# Patient Record
Sex: Female | Born: 1950 | Race: White | Hispanic: No | Smoking: Never smoker
Health system: Southern US, Community
[De-identification: ages and names within clinical notes are randomized; demographics above are authoritative.]

## PROBLEM LIST (undated history)

## (undated) DIAGNOSIS — Z98811 Dental restoration status: Secondary | ICD-10-CM

## (undated) DIAGNOSIS — F32A Depression, unspecified: Secondary | ICD-10-CM

## (undated) DIAGNOSIS — F329 Major depressive disorder, single episode, unspecified: Secondary | ICD-10-CM

## (undated) DIAGNOSIS — IMO0002 Reserved for concepts with insufficient information to code with codable children: Secondary | ICD-10-CM

## (undated) DIAGNOSIS — I10 Essential (primary) hypertension: Secondary | ICD-10-CM

## (undated) DIAGNOSIS — Z853 Personal history of malignant neoplasm of breast: Secondary | ICD-10-CM

## (undated) DIAGNOSIS — H269 Unspecified cataract: Secondary | ICD-10-CM

## (undated) DIAGNOSIS — F419 Anxiety disorder, unspecified: Secondary | ICD-10-CM

## (undated) HISTORY — DX: Essential (primary) hypertension: I10

## (undated) HISTORY — PX: APPENDECTOMY: SHX54

## (undated) HISTORY — DX: Major depressive disorder, single episode, unspecified: F32.9

## (undated) HISTORY — PX: ABSCESS DRAINAGE: SHX1119

## (undated) HISTORY — DX: Depression, unspecified: F32.A

---

## 1987-06-07 HISTORY — PX: ABDOMINAL HYSTERECTOMY: SHX81

## 1997-12-31 ENCOUNTER — Encounter: Admission: RE | Admit: 1997-12-31 | Discharge: 1997-12-31 | Payer: Self-pay | Admitting: *Deleted

## 2001-08-31 ENCOUNTER — Ambulatory Visit (HOSPITAL_COMMUNITY): Admission: RE | Admit: 2001-08-31 | Discharge: 2001-08-31 | Payer: Self-pay

## 2001-08-31 ENCOUNTER — Encounter: Payer: Self-pay | Admitting: Internal Medicine

## 2002-06-06 HISTORY — PX: COLONOSCOPY: SHX174

## 2002-09-20 ENCOUNTER — Encounter: Payer: Self-pay | Admitting: Internal Medicine

## 2002-09-20 ENCOUNTER — Ambulatory Visit (HOSPITAL_COMMUNITY): Admission: RE | Admit: 2002-09-20 | Discharge: 2002-09-20 | Payer: Self-pay | Admitting: Internal Medicine

## 2002-12-20 ENCOUNTER — Ambulatory Visit (HOSPITAL_COMMUNITY): Admission: RE | Admit: 2002-12-20 | Discharge: 2002-12-20 | Payer: Self-pay | Admitting: Internal Medicine

## 2003-09-26 ENCOUNTER — Ambulatory Visit (HOSPITAL_COMMUNITY): Admission: RE | Admit: 2003-09-26 | Discharge: 2003-09-26 | Payer: Self-pay | Admitting: Internal Medicine

## 2004-10-01 ENCOUNTER — Ambulatory Visit (HOSPITAL_COMMUNITY): Admission: RE | Admit: 2004-10-01 | Discharge: 2004-10-01 | Payer: Self-pay | Admitting: Internal Medicine

## 2005-10-03 ENCOUNTER — Ambulatory Visit (HOSPITAL_COMMUNITY): Admission: RE | Admit: 2005-10-03 | Discharge: 2005-10-03 | Payer: Self-pay | Admitting: Internal Medicine

## 2006-10-09 ENCOUNTER — Ambulatory Visit (HOSPITAL_COMMUNITY): Admission: RE | Admit: 2006-10-09 | Discharge: 2006-10-09 | Payer: Self-pay | Admitting: Internal Medicine

## 2007-10-12 ENCOUNTER — Ambulatory Visit (HOSPITAL_COMMUNITY): Admission: RE | Admit: 2007-10-12 | Discharge: 2007-10-12 | Payer: Self-pay | Admitting: Internal Medicine

## 2008-10-15 ENCOUNTER — Ambulatory Visit (HOSPITAL_COMMUNITY): Admission: RE | Admit: 2008-10-15 | Discharge: 2008-10-15 | Payer: Self-pay | Admitting: Internal Medicine

## 2009-10-19 ENCOUNTER — Ambulatory Visit (HOSPITAL_COMMUNITY): Admission: RE | Admit: 2009-10-19 | Discharge: 2009-10-19 | Payer: Self-pay | Admitting: Internal Medicine

## 2010-06-27 ENCOUNTER — Encounter: Payer: Self-pay | Admitting: Internal Medicine

## 2010-10-22 NOTE — Op Note (Signed)
Catherine Atkins, Catherine Atkins                           ACCOUNT NO.:  [id number]   MEDICAL RECORD NO.:  [id number]                   PATIENT TYPE:  AMB   LOCATION:  DAY                                  FACILITY:  [facility]   PHYSICIAN:  R. Roetta Sessions, M.D.              DATE OF BIRTH:  06/15/1950   DATE OF PROCEDURE:  12/20/2002  DATE OF DISCHARGE:                                 OPERATIVE REPORT   PROCEDURE:  Colonoscopy with biopsy.   ENDOSCOPIST:  Gerrit Friends. Rourk, M.D.   INDICATIONS FOR PROCEDURE:  The patient is a 60 year old lady sent over by  Dr. Ouida Sills for colorectal cancer screening.  She has never had her lower GI  tract imaged. There is no family history of colon cancer and she is  asymptomatic from a GI standpoint.  Colonoscopy is now being done as a  standard screening maneuver.  This approach has been discussed with the  patient at the bedside.  The potential risks, benefits, and alternatives  have been reviewed; questions answered; she is agreeable.  Please see my  handwritten H&P for more information.   PROCEDURE NOTE:  O2 saturation, blood pressure, pulse and respirations were  monitored throughout the entire procedure.  Conscious sedation: Versed 3 mg IV, Demerol 75 mg IV in divided doses.   INSTRUMENT:  Olympus video chip pediatric colonoscope.   FINDINGS:  Digital rectal exam revealed no abnormalities.   ENDOSCOPIC FINDINGS:  The prep was adequate.   RECTUM:  Examination of the rectal mucosa including the retroflex view of  the anal verge revealed no abnormalities.  At the rectosigmoid junction  there were three 2-mm polyps which were cold biopsied/removed.   COLON:  The colonic mucosa was surveyed from the rectosigmoid junction  through the left transverse and right colon to the area of the appendiceal  orifice, ileocecal valve, and cecum.  These structures were well seen and  photographed for the record.  The patient had sigmoid diverticula.  The  remainder of the  colonic mucosa to the cecum appeared normal.   From the level the scope was slowly withdrawn.  All previously mentioned  mucosal surfaces were again seen; and no other abnormalities were observed.  The patient tolerated the procedure well and was reacted in endoscopy.   IMPRESSION:  1. Diminutive rectosigmoid polyps as described above, cold biopsied/removed.     The remainder of the rectum appeared normal with sigmoid diverticular     disease.  2. The remainder of the colonic mucosa to the cecum appeared normal.    RECOMMENDATIONS:  1. Diverticulosis literature given to the patient.  2. Follow up on path.  3. Further recommendations to follow.  Jonathon Bellows, M.D.    RMR/MEDQ  D:  12/20/2002  T:  12/20/2002  Job:  [id number]   cc:   Kingsley Callander. Ouida Sills, M.D.  [address]  [place]  [place] [address]  Fax: [phone]

## 2010-10-25 ENCOUNTER — Other Ambulatory Visit (HOSPITAL_COMMUNITY): Payer: Self-pay | Admitting: Internal Medicine

## 2010-10-25 DIAGNOSIS — Z139 Encounter for screening, unspecified: Secondary | ICD-10-CM

## 2010-11-04 ENCOUNTER — Ambulatory Visit (HOSPITAL_COMMUNITY)
Admission: RE | Admit: 2010-11-04 | Discharge: 2010-11-04 | Disposition: A | Discharge status: Home / Self Care | Payer: BC Managed Care – PPO | Source: Ambulatory Visit | Attending: Internal Medicine | Admitting: Internal Medicine

## 2010-11-04 DIAGNOSIS — Z139 Encounter for screening, unspecified: Secondary | ICD-10-CM

## 2010-11-04 DIAGNOSIS — Z1231 Encounter for screening mammogram for malignant neoplasm of breast: Secondary | ICD-10-CM | POA: Insufficient documentation

## 2011-06-07 DIAGNOSIS — Z853 Personal history of malignant neoplasm of breast: Secondary | ICD-10-CM

## 2011-06-07 HISTORY — DX: Personal history of malignant neoplasm of breast: Z85.3

## 2011-11-14 ENCOUNTER — Other Ambulatory Visit (HOSPITAL_COMMUNITY): Payer: Self-pay | Admitting: Internal Medicine

## 2011-11-14 DIAGNOSIS — Z139 Encounter for screening, unspecified: Secondary | ICD-10-CM

## 2011-11-18 ENCOUNTER — Ambulatory Visit (HOSPITAL_COMMUNITY)
Admission: RE | Admit: 2011-11-18 | Discharge: 2011-11-18 | Disposition: A | Discharge status: Home / Self Care | Payer: BC Managed Care – PPO | Source: Ambulatory Visit | Attending: Internal Medicine | Admitting: Internal Medicine

## 2011-11-18 DIAGNOSIS — Z139 Encounter for screening, unspecified: Secondary | ICD-10-CM

## 2011-11-24 ENCOUNTER — Ambulatory Visit (HOSPITAL_COMMUNITY)
Admission: RE | Admit: 2011-11-24 | Discharge: 2011-11-24 | Disposition: A | Discharge status: Home / Self Care | Payer: BC Managed Care – PPO | Source: Ambulatory Visit | Attending: Internal Medicine | Admitting: Internal Medicine

## 2011-11-24 DIAGNOSIS — Z1231 Encounter for screening mammogram for malignant neoplasm of breast: Secondary | ICD-10-CM | POA: Insufficient documentation

## 2011-11-29 ENCOUNTER — Other Ambulatory Visit (HOSPITAL_COMMUNITY): Payer: Self-pay | Admitting: Internal Medicine

## 2011-11-29 ENCOUNTER — Other Ambulatory Visit: Payer: Self-pay | Admitting: Internal Medicine

## 2011-11-29 DIAGNOSIS — N6320 Unspecified lump in the left breast, unspecified quadrant: Secondary | ICD-10-CM

## 2011-11-29 DIAGNOSIS — R928 Other abnormal and inconclusive findings on diagnostic imaging of breast: Secondary | ICD-10-CM

## 2011-12-07 ENCOUNTER — Ambulatory Visit (HOSPITAL_COMMUNITY)
Admission: RE | Admit: 2011-12-07 | Discharge: 2011-12-07 | Disposition: A | Discharge status: Home / Self Care | Payer: BC Managed Care – PPO | Source: Ambulatory Visit | Attending: Internal Medicine | Admitting: Internal Medicine

## 2011-12-07 ENCOUNTER — Other Ambulatory Visit (HOSPITAL_COMMUNITY): Payer: Self-pay | Admitting: Internal Medicine

## 2011-12-07 DIAGNOSIS — N6320 Unspecified lump in the left breast, unspecified quadrant: Secondary | ICD-10-CM

## 2011-12-07 DIAGNOSIS — N63 Unspecified lump in unspecified breast: Secondary | ICD-10-CM | POA: Insufficient documentation

## 2011-12-07 DIAGNOSIS — R928 Other abnormal and inconclusive findings on diagnostic imaging of breast: Secondary | ICD-10-CM

## 2011-12-07 DIAGNOSIS — C50912 Malignant neoplasm of unspecified site of left female breast: Secondary | ICD-10-CM | POA: Insufficient documentation

## 2011-12-07 DIAGNOSIS — Z803 Family history of malignant neoplasm of breast: Secondary | ICD-10-CM | POA: Insufficient documentation

## 2011-12-07 NOTE — Progress Notes (Signed)
Left breast biopsy performed

## 2011-12-07 NOTE — Procedures (Signed)
Ultrasound guided core needle biopsy was performed of a mass at 9:00 left breast. 2 % lidocaine was used for local anesthesia. There was minimal blood loss. The patient tolerated the procedure well. No immediate complication. Specimen was sent to pathology. Full report was dictated in radiology.

## 2011-12-09 ENCOUNTER — Other Ambulatory Visit: Payer: Self-pay | Admitting: Internal Medicine

## 2011-12-09 ENCOUNTER — Other Ambulatory Visit (HOSPITAL_COMMUNITY): Payer: BC Managed Care – PPO

## 2011-12-09 DIAGNOSIS — C50912 Malignant neoplasm of unspecified site of left female breast: Secondary | ICD-10-CM

## 2011-12-13 ENCOUNTER — Telehealth (INDEPENDENT_AMBULATORY_CARE_PROVIDER_SITE_OTHER): Payer: Self-pay | Admitting: General Surgery

## 2011-12-13 NOTE — Telephone Encounter (Signed)
Appt moved up to 12/16/11.

## 2011-12-13 NOTE — Telephone Encounter (Signed)
Message copied by Liliana Cline on Tue Dec 13, 2011  8:16 AM ------      Message from: Currie Paris      Created: Mon Dec 12, 2011  8:54 AM       I could see her Friday before her MRI      ----- Message -----         From: Zacarias Pontes         Sent: 12/09/2011   2:19 PM           To: Currie Paris, MD, Liliana Cline, CMA            Pt has invasive mammary breast ca..pls call if you have a sooner apt,,Thanks

## 2011-12-16 ENCOUNTER — Other Ambulatory Visit: Payer: Self-pay | Admitting: Internal Medicine

## 2011-12-16 ENCOUNTER — Ambulatory Visit
Admission: RE | Admit: 2011-12-16 | Discharge: 2011-12-16 | Disposition: A | Discharge status: Home / Self Care | Payer: BC Managed Care – PPO | Source: Ambulatory Visit | Attending: Internal Medicine | Admitting: Internal Medicine

## 2011-12-16 ENCOUNTER — Encounter (INDEPENDENT_AMBULATORY_CARE_PROVIDER_SITE_OTHER): Payer: Self-pay | Admitting: Surgery

## 2011-12-16 ENCOUNTER — Ambulatory Visit (INDEPENDENT_AMBULATORY_CARE_PROVIDER_SITE_OTHER): Payer: BC Managed Care – PPO | Admitting: Surgery

## 2011-12-16 VITALS — BP 118/80 | HR 68 | Temp 97.6°F | Resp 16 | Ht 70.0 in | Wt 237.2 lb

## 2011-12-16 DIAGNOSIS — C50919 Malignant neoplasm of unspecified site of unspecified female breast: Secondary | ICD-10-CM

## 2011-12-16 DIAGNOSIS — C50912 Malignant neoplasm of unspecified site of left female breast: Secondary | ICD-10-CM

## 2011-12-16 DIAGNOSIS — R928 Other abnormal and inconclusive findings on diagnostic imaging of breast: Secondary | ICD-10-CM

## 2011-12-16 MED ORDER — GADOBENATE DIMEGLUMINE 529 MG/ML IV SOLN
10.0000 mL | Freq: Once | INTRAVENOUS | Status: AC | PRN
  Administered 2011-12-16: 10 mL via INTRAVENOUS

## 2011-12-16 NOTE — Addendum Note (Signed)
Addended by: Ethlyn Gallery on: 12/16/2011 08:53 AM   Modules accepted: Orders

## 2011-12-16 NOTE — Patient Instructions (Signed)
I will call you next week after I have received the report of the MRI. In the meantime we will arrange a consultation with raditation therapy about post op radiation and with a genetic counselor to tlak about risks for familial inherited types of breast cancer.

## 2011-12-16 NOTE — Progress Notes (Signed)
Patient ID: Catherine Atkins, female   DOB: Jan 14, 1951, 61 y.o.   MRN: [id number]  Chief Complaint  Patient presents with  . Breast Cancer    Left    HPI Catherine Atkins is a 61 y.o. female.  She recently had a mammogram and an abnormality noted in the lateral left breast .NCB shows IDC, receptor +_ and Her 2 neg. MRI pending. No other prior breast problems and no current symptoms. Mother had breast ca at 55 and sister died from breast cancer in the fifties HPI  Past Medical History  Diagnosis Date  . Blood transfusion   . Depression   . Hypertension   . Cancer     lt breast  . Cancer of left breast 12/07/2011    Mammographically detectged, Dx by [facility] as IDC, Receptor+, Her 2 Negative    Past Surgical History  Procedure Date  . Abdominal hysterectomy     Family History  Problem Relation Age of Onset  . Cancer Mother     breast  . Cancer Sister     breast    Social History History  Substance Use Topics  . Smoking status: Never Smoker   . Smokeless tobacco: Not on file  . Alcohol Use: No    Allergies  Allergen Reactions  . Codeine     Current Outpatient Prescriptions  Medication Sig Dispense Refill  . amitriptyline (ELAVIL) 25 MG tablet Take 25 mg by mouth at bedtime.      Marland Kitchen amLODipine-benazepril (LOTREL) 5-20 MG per capsule Take 1 capsule by mouth daily.      Marland Kitchen aspirin 81 MG tablet Take 81 mg by mouth daily.      Marland Kitchen atenolol (TENORMIN) 100 MG tablet Take 100 mg by mouth daily.      Marland Kitchen buPROPion (WELLBUTRIN XL) 300 MG 24 hr tablet Take 300 mg by mouth daily.      . cloNIDine (CATAPRES) 0.1 MG tablet Take 0.1 mg by mouth 2 (two) times daily.      Marland Kitchen triamterene-hydrochlorothiazide (MAXZIDE) 75-50 MG per tablet Take 1 tablet by mouth daily.        Review of Systems Review of Systems  Constitutional: Negative for fever, chills and unexpected weight change.  HENT: Negative for hearing loss, congestion, sore throat, trouble swallowing and voice change.   Eyes:  Negative for visual disturbance.  Respiratory: Negative for cough and wheezing.   Cardiovascular: Negative for chest pain, palpitations and leg swelling.  Gastrointestinal: Negative for nausea, vomiting, abdominal pain, diarrhea, constipation, blood in stool, abdominal distention and anal bleeding.  Genitourinary: Negative for hematuria, vaginal bleeding and difficulty urinating.  Musculoskeletal: Negative for arthralgias.  Skin: Negative for rash and wound.  Neurological: Negative for seizures, syncope and headaches.  Hematological: Negative for adenopathy. Does not bruise/bleed easily.  Psychiatric/Behavioral: Negative for confusion.    Blood pressure 118/80, pulse 68, temperature 97.6 F (36.4 C), temperature source Temporal, resp. rate 16, height 5\' 10"  (1.778 m), weight 237 lb 3.2 oz (107.593 kg).  Physical Exam Physical Exam  Vitals reviewed. Constitutional: She is oriented to person, place, and time. She appears well-developed and well-nourished. No distress.  HENT:  Head: Normocephalic and atraumatic.  Mouth/Throat: Oropharynx is clear and moist.  Eyes: Conjunctivae and EOM are normal. Pupils are equal, round, and reactive to light. No scleral icterus.  Neck: Normal range of motion. Neck supple. No tracheal deviation present. No thyromegaly present.  Cardiovascular: Normal rate, regular rhythm, normal heart sounds and intact distal  pulses.  Exam reveals no gallop and no friction rub.   No murmur heard. Pulmonary/Chest: Effort normal and breath sounds normal. No respiratory distress. She has no wheezes. She has no rales.         Ecchymosis as noted  Abdominal: Soft. Bowel sounds are normal. She exhibits no distension and no mass. There is no tenderness. There is no rebound and no guarding.  Musculoskeletal: Normal range of motion. She exhibits no edema and no tenderness.  Lymphadenopathy:    She has no cervical adenopathy.    She has no axillary adenopathy.       Right: No  supraclavicular adenopathy present.       Left: No supraclavicular adenopathy present.  Neurological: She is alert and oriented to person, place, and time.  Skin: Skin is warm and dry. No rash noted. She is not diaphoretic. No erythema.  Psychiatric: She has a normal mood and affect. Her behavior is normal. Judgment and thought content normal.    Data Reviewed Mammongram, path, ultrasound all reviewed  Assessment    Clinical stage I left breast IDC, receptor +    Plan    Await MRI for definititve planning, but likely lumpectomy candidate. Will get genetic counseling set up as well. She may be a candidate for Mammosite radiation. I have explained the pathophysiology and staging of breast cancer with particular attention to her exact situation. We discussed the multidisciplinary approach to breast cancer which often includes both medical and radiation oncology consultations.  We also discussed surgical options for the treatment of breast cancer including lumpectomy and mastectomy with possible reconstructive surgery. In addition we talked about the evaluation and management of lymph nodes including a description of sentinel lymph node biopsy and axillary dissections. We reviewed potential complications and risks including bleeding, infection, numbness,  lymphedema, and the potential need for additional surgery.  She understands that for patients who are candidate for lumpectomy or mastectomy there is an equal survival rate with either technique, but a slightly higher local recurrence rate with lumpectomy. In addition she knows that a lumpectomy usually requires postoperative radiation as part of the management of the breast cancer.  We have discussed the likely postoperative course and plans for followup.  I have given the patient some written information that reviewed all of these issues. I believe her questions are answered and that she has a good understanding of the issues. I have  discussed the indications for the lumpectomy and described the procedure. She understand that the chance of removal of the abnormal area is very good, but that occasionally we are unable to locate it and may have to do a second procedure. We also discussed the possibility of a second procedure to get additional tissue. Risks of surgery such as bleeding and infection have also been explained, as well as the implications of not doing the surgery. She understands and wishes to proceed.         Dagan Heinz J 12/16/2011, 8:27 AM

## 2011-12-19 ENCOUNTER — Telehealth: Payer: Self-pay | Admitting: *Deleted

## 2011-12-19 NOTE — Telephone Encounter (Signed)
Patient's husband saw that I called and called me and I confirmed 01/19/12 genetic appt w/ pt.  Emailed Jade at [facility] to make her aware.

## 2011-12-19 NOTE — Telephone Encounter (Signed)
No answer, no machine.  Will try back later.

## 2011-12-20 ENCOUNTER — Encounter: Payer: Self-pay | Admitting: *Deleted

## 2011-12-20 ENCOUNTER — Telehealth (INDEPENDENT_AMBULATORY_CARE_PROVIDER_SITE_OTHER): Payer: Self-pay | Admitting: Surgery

## 2011-12-21 ENCOUNTER — Ambulatory Visit
Admission: RE | Admit: 2011-12-21 | Discharge: 2011-12-21 | Disposition: A | Discharge status: Home / Self Care | Payer: BC Managed Care – PPO | Source: Ambulatory Visit | Attending: Radiation Oncology | Admitting: Radiation Oncology

## 2011-12-21 ENCOUNTER — Encounter: Payer: Self-pay | Admitting: *Deleted

## 2011-12-21 ENCOUNTER — Encounter: Payer: Self-pay | Admitting: Radiation Oncology

## 2011-12-21 VITALS — BP 134/83 | HR 67 | Temp 97.8°F | Resp 20 | Wt 238.2 lb

## 2011-12-21 DIAGNOSIS — C50919 Malignant neoplasm of unspecified site of unspecified female breast: Secondary | ICD-10-CM | POA: Insufficient documentation

## 2011-12-21 DIAGNOSIS — Z79899 Other long term (current) drug therapy: Secondary | ICD-10-CM | POA: Insufficient documentation

## 2011-12-21 DIAGNOSIS — C50912 Malignant neoplasm of unspecified site of left female breast: Secondary | ICD-10-CM

## 2011-12-21 DIAGNOSIS — I10 Essential (primary) hypertension: Secondary | ICD-10-CM | POA: Insufficient documentation

## 2011-12-21 DIAGNOSIS — Z170 Estrogen receptor positive status [ER+]: Secondary | ICD-10-CM | POA: Insufficient documentation

## 2011-12-21 NOTE — Telephone Encounter (Signed)
Attempted to call re MRI. She is scheduled for MRI bx on Thursday

## 2011-12-21 NOTE — Progress Notes (Signed)
Please see the Nurse Progress Note in the MD Initial Consult Encounter for this patient. 

## 2011-12-21 NOTE — Progress Notes (Signed)
12/18/11=Left breast mass 9  0 'clock bx=Invasive mammary Carcinoma<ER?PR+POsitive,Her 2=neg   Mother and sister / breast ca  Allergies: Codeine

## 2011-12-22 ENCOUNTER — Other Ambulatory Visit: Payer: Self-pay | Admitting: Diagnostic Radiology

## 2011-12-22 ENCOUNTER — Ambulatory Visit
Admission: RE | Admit: 2011-12-22 | Discharge: 2011-12-22 | Disposition: A | Discharge status: Home / Self Care | Payer: BC Managed Care – PPO | Source: Ambulatory Visit | Attending: Internal Medicine | Admitting: Internal Medicine

## 2011-12-22 DIAGNOSIS — R928 Other abnormal and inconclusive findings on diagnostic imaging of breast: Secondary | ICD-10-CM

## 2011-12-24 DIAGNOSIS — C50419 Malignant neoplasm of upper-outer quadrant of unspecified female breast: Secondary | ICD-10-CM | POA: Insufficient documentation

## 2011-12-24 NOTE — Progress Notes (Signed)
[facility] Radiation Oncology NEW PATIENT EVALUATION  Name: Catherine Atkins MRN: [id number]  Date:   12/21/2011           DOB: 08-26-1950  Status: outpatient   CC: Carylon Perches, MD  Streck, Reola Mosher, MD    REFERRING PHYSICIAN: Currie Paris, MD   DIAGNOSIS: The encounter diagnosis was Cancer of left breast.    HISTORY OF PRESENT ILLNESS:  Catherine Atkins is a 61 y.o. female who is seen today regarding a new diagnosis of left-sided breast cancer. The patient recently was found to have a potential abnormality within the left breast on recent screening mammogram. This prompted further workup. Focal spot compression views demonstrated a persistent area of distortion. No mass was palpated within the suspicious area within the upper outer left breast. Ultrasound was performed. No discrete reproducible correlate was seen regarding this area. Ultrasound of the left breast did reveal at the 3:00 position an approximate 0.9 cm suspicious mass. There is no lymphadenopathy on ultrasound within the left axilla. Biopsy of the area at the 3:00 position was recommended and this has been completed. This returned positive for invasive mammary carcinoma. This corresponded to an invasive ductal carcinoma. The tumor is ER positive and PR positive as well as HER-2/neu negative. The patient has further undergone an MRI scan of the breasts bilaterally. This showed a 1.2 x 1.2 x 0.9 cm enhancing mass with irregular margins at the 3:00 position corresponding to the previous finding. The previously demonstrated area of architectural distortion was evident on MRI scan and measured 7.6 x 4.9 x 2.5 cm. A biopsy of this area is pending.  PREVIOUS RADIATION THERAPY: No   PAST MEDICAL HISTORY:  has a past medical history of Depression; Hypertension; Cancer; Cancer of left breast (12/07/2011); Allergy; and Blood transfusion.     PAST SURGICAL HISTORY:  Past Surgical History  Procedure Date  . Breast  biopsy 12/08/11    Left breast 9 o'clock  bx=invasive mammary ca,ER/PR=+,IDC Her2 neg  . Abdominal hysterectomy 1989    b/l salpingoo=oppherecty     FAMILY HISTORY: family history includes Cancer in her mother and sister.   SOCIAL HISTORY:  reports that she has never smoked. She does not have any smokeless tobacco history on file. She reports that she does not drink alcohol or use illicit drugs.   ALLERGIES: Codeine   MEDICATIONS:  Current Outpatient Prescriptions  Medication Sig Dispense Refill  . acetaminophen (TYLENOL) 500 MG tablet Take 500 mg by mouth every 6 (six) hours as needed. Take 2 500mg  po prn headacjhes      . amitriptyline (ELAVIL) 25 MG tablet Take 25 mg by mouth at bedtime.      Marland Kitchen amLODipine-benazepril (LOTREL) 5-20 MG per capsule Take 1 capsule by mouth daily.      Marland Kitchen atenolol (TENORMIN) 100 MG tablet Take 100 mg by mouth daily.      Marland Kitchen buPROPion (WELLBUTRIN XL) 300 MG 24 hr tablet Take 300 mg by mouth daily.      . cloNIDine (CATAPRES) 0.1 MG tablet Take 0.1 mg by mouth 2 (two) times daily.      Marland Kitchen triamterene-hydrochlorothiazide (MAXZIDE) 75-50 MG per tablet Take 1 tablet by mouth daily.      Marland Kitchen aspirin 81 MG tablet Take 81 mg by mouth daily.         REVIEW OF SYSTEMS:  Pertinent items are noted in HPI.    PHYSICAL EXAM:  weight is 238 lb 3.2 oz (  108.047 kg). Her oral temperature is 97.8 F (36.6 C). Her blood pressure is 134/83 and her pulse is 67. Her respiration is 20 and oxygen saturation is 99%.   General: Well-developed, in no acute distress HEENT: Normocephalic, atraumatic; oral cavity clear Neck: Supple without any lymphadenopathy Cardiovascular: Regular rate and rhythm Respiratory: Clear to auscultation bilaterally Breasts: No suspicious findings on the right. No axillary adenopathy on the left. Post biopsy change is present within the lateral aspect of the left breast. I really cannot palpate any discrete area within the upper outer quadrant although  there is some possible increased firmness in this area. GI: Soft, nontender, normal bowel sounds Extremities: No edema present Neuro: No focal deficits     LABORATORY DATA:  No results found for this basename: WBC, HGB, HCT, MCV, PLT   No results found for this basename: NA, K, CL, CO2   No results found for this basename: ALT, AST, GGT, ALKPHOS, BILITOT      IMPRESSION: Pleasant 61 year old female with a recent diagnosis of left-sided breast cancer. The biopsy-proven area is quite small and represents a T1 B. tumor on ultrasound with no apparent axillary adenopathy. An additional biopsy of the larger area of distortion is pending within the upper outer quadrant of the left breast.   PLAN: Further workup is therefore pending. The patient has seen Dr. Jamey Ripa and the surgical options will depend on the patient's further evaluation. As it stands right now the patient is potentially a candidate for lumpectomy. However there is significant concern regarding the additional larger area and if this is positive then the patient may no longer be a good candidate for a lumpectomy. This will be further discussed with Dr. Jamey Ripa. If this second area is positive then this may also represent a T3 tumor based on the imaging and it would be possible that she would be an appropriate candidate for postmastectomy radiation.  I therefore went over the possible role of radiation treatment in this setting of a lumpectomy and a mastectomy. She would require adjuvant treatment with a lumpectomy. We discussed in some detail what the treatment is like in terms of postoperative radiation. On the basis of her workup I would favor external beam radiotherapy at this point and therefore we did not really discuss in any detail MammoSite brachytherapy. All of her questions were answered and I will followup on her upcoming results and we can bring her back to clinic at the appropriate time.   I spent 65 minutes minutes face to  face with the patient and more than 50% of that time was spent in counseling and/or coordination of care.

## 2011-12-24 NOTE — Addendum Note (Signed)
Encounter addended by: Jonna Coup, MD on: 12/24/2011 11:28 PM<BR>     Documentation filed: Notes Section

## 2011-12-26 ENCOUNTER — Telehealth (INDEPENDENT_AMBULATORY_CARE_PROVIDER_SITE_OTHER): Payer: Self-pay | Admitting: Surgery

## 2011-12-26 DIAGNOSIS — C50919 Malignant neoplasm of unspecified site of unspecified female breast: Secondary | ICD-10-CM

## 2011-12-26 NOTE — Telephone Encounter (Signed)
Medical oncology referral placed

## 2011-12-27 ENCOUNTER — Telehealth: Payer: Self-pay | Admitting: *Deleted

## 2011-12-27 NOTE — Telephone Encounter (Signed)
No Answer, No Machine.  Will try again later.

## 2011-12-28 ENCOUNTER — Telehealth: Payer: Self-pay | Admitting: *Deleted

## 2011-12-28 ENCOUNTER — Other Ambulatory Visit (INDEPENDENT_AMBULATORY_CARE_PROVIDER_SITE_OTHER): Payer: Self-pay | Admitting: Surgery

## 2011-12-28 ENCOUNTER — Telehealth (INDEPENDENT_AMBULATORY_CARE_PROVIDER_SITE_OTHER): Payer: Self-pay

## 2011-12-28 DIAGNOSIS — C50912 Malignant neoplasm of unspecified site of left female breast: Secondary | ICD-10-CM

## 2011-12-28 NOTE — Telephone Encounter (Signed)
Misty Stanley returned my call and she is trying to get in touch with the pt about making an appt with the medical oncologist. The pt has no answer machine so she has to wait for someone to see they have a missed call. Misty Stanley tried 3 times yesterday and will try again today. I told Misty Stanley that Dr Jamey Ripa just wants her seen first by medical oncology before we can schedule her surgery. Misty Stanley will send message once the appt has been made.

## 2011-12-28 NOTE — Telephone Encounter (Signed)
Patient saw I called and returned my call.   I confirmed 01/10/12 appt w/ pt.  Mailed before appt letter & packet to pt.  Emailed Alisha at [facility] to make aware.  Took paperwork to Med Rec to scan.

## 2011-12-28 NOTE — Telephone Encounter (Signed)
LMOM for Catherine Atkins to call me back b/c I am checking to see if pt is going to be seeing a medical oncologist.

## 2011-12-30 ENCOUNTER — Encounter (INDEPENDENT_AMBULATORY_CARE_PROVIDER_SITE_OTHER): Payer: Self-pay | Admitting: Surgery

## 2011-12-30 ENCOUNTER — Telehealth (INDEPENDENT_AMBULATORY_CARE_PROVIDER_SITE_OTHER): Payer: Self-pay | Admitting: General Surgery

## 2011-12-30 NOTE — Telephone Encounter (Signed)
Preop appt made with patient on 01/23/12.

## 2011-12-30 NOTE — Telephone Encounter (Signed)
Message copied by Liliana Cline on Fri Dec 30, 2011  3:20 PM ------      Message from: Currie Paris      Created: Fri Dec 30, 2011  3:15 PM       Yes - wanted to review plans with her. She also needs onc appointment before her surgery, should be in the works already      ----- Message -----         From: Liliana Cline, CMA         Sent: 12/30/2011   2:39 PM           To: Currie Paris, MD            Does she need another appt?            ----- Message -----         From: Zacarias Pontes         Sent: 12/30/2011   2:20 PM           To: Liliana Cline, CMA            Pt wants to know if she needs to be seen back here before her surgery.Marland Kitcheni told her you would call her if so

## 2012-01-05 ENCOUNTER — Other Ambulatory Visit: Payer: Self-pay | Admitting: *Deleted

## 2012-01-05 DIAGNOSIS — C50419 Malignant neoplasm of upper-outer quadrant of unspecified female breast: Secondary | ICD-10-CM

## 2012-01-10 ENCOUNTER — Ambulatory Visit: Payer: BC Managed Care – PPO

## 2012-01-10 ENCOUNTER — Ambulatory Visit (HOSPITAL_BASED_OUTPATIENT_CLINIC_OR_DEPARTMENT_OTHER): Payer: BC Managed Care – PPO | Admitting: Oncology

## 2012-01-10 ENCOUNTER — Other Ambulatory Visit (HOSPITAL_BASED_OUTPATIENT_CLINIC_OR_DEPARTMENT_OTHER): Payer: BC Managed Care – PPO | Admitting: Lab

## 2012-01-10 VITALS — BP 133/84 | HR 78 | Temp 98.1°F | Resp 20 | Ht 69.5 in | Wt 236.6 lb

## 2012-01-10 DIAGNOSIS — C50119 Malignant neoplasm of central portion of unspecified female breast: Secondary | ICD-10-CM

## 2012-01-10 DIAGNOSIS — Z170 Estrogen receptor positive status [ER+]: Secondary | ICD-10-CM

## 2012-01-10 DIAGNOSIS — C50419 Malignant neoplasm of upper-outer quadrant of unspecified female breast: Secondary | ICD-10-CM

## 2012-01-10 DIAGNOSIS — C50912 Malignant neoplasm of unspecified site of left female breast: Secondary | ICD-10-CM

## 2012-01-10 LAB — COMPREHENSIVE METABOLIC PANEL
ALT: 19 U/L (ref 0–35)
Alkaline Phosphatase: 60 U/L (ref 39–117)
CO2: 29 mEq/L (ref 19–32)
Sodium: 138 mEq/L (ref 135–145)
Total Bilirubin: 0.3 mg/dL (ref 0.3–1.2)
Total Protein: 7.3 g/dL (ref 6.0–8.3)

## 2012-01-10 LAB — CBC WITH DIFFERENTIAL/PLATELET
BASO%: 1 % (ref 0.0–2.0)
LYMPH%: 35.4 % (ref 14.0–49.7)
MCHC: 33.5 g/dL (ref 31.5–36.0)
MONO#: 0.5 10*3/uL (ref 0.1–0.9)
Platelets: 304 10*3/uL (ref 145–400)
RBC: 3.78 10*6/uL (ref 3.70–5.45)
WBC: 7.5 10*3/uL (ref 3.9–10.3)

## 2012-01-10 LAB — CANCER ANTIGEN 27.29: CA 27.29: 25 U/mL (ref 0–39)

## 2012-01-10 NOTE — Progress Notes (Signed)
Catherine Atkins [id number] Jul 17, 1950 61 y.o. 01/10/2012 5:58 PM  CC  Carylon Perches, MD [address] [place] [address]  REASON FOR CONSULTATION: Breast cancer  Patient was seen in the [facility] for discussion of her treatment options. She was seen by Dr. Pierce Crane. STAGE:   No matching staging information was found for the patient.  REFERRING PHYSICIAN: Dr. Cicero Duck HISTORY OF PRESENT ILLNESS:  Catherine Atkins is a 61 y.o. female.  [place] who presents with an abnormal left mammogram on 11/24/2011 a subsequent diagnostic films ultrasounds performed on 12/07/2011. Patient is suspicious for a number of left breast breast 1:00 position. Ultrasound guided biopsy is recommended. This was performed 12/12/2011 initial invasive mammary carcinoma intermediate type. This is ER positive percent, PR +80% and proliferative index 19%. HER-2 ratio was nonamplified the ratio 1.36. MRI scan performed on 12/16/2011 showed a biopsy-proven invasive mammary carcinoma measuring 1.2 x 1.2 x 0.9 cm. In addition in the central left breast there was an area of clumped linear and nodular enhancement measuring 7.6 x 4.9 x 2.5 cm. This was suspicious for carcinoma and a biopsy is recommended. A biopsy of this area was performed on 12/22/2011. And this indeed did show carcinoma of breast type, ER +100%, PR +80%, proliferative index 10% and HER-2 what ratio nonamplified the ratio 1.18. The patient has been seen by Dr. Chase Caller and this is a recommendation has been made for mastectomy.. She has undergone annual screening mammography.   Past Medical History:     Past Medical History  Diagnosis Date  . Depression   . Hypertension   . Cancer     lt breast  . Cancer of left breast 12/07/2011    Mammographically detectged, Dx by [facility] as IDC, Receptor+, Her 2 Negative  . Allergy     codeine  . Blood transfusion     years ago/    Past Surgical  History Past Surgical History  Procedure Date  . Breast biopsy 12/08/11    Left breast 9 o'clock  bx=invasive mammary ca,ER/PR=+,IDC Her2 neg  . Abdominal hysterectomy 1989    b/l salpingoo=oppherecty    Family History: Family History  Problem Relation Age of Onset  . Cancer Mother 61    breast  . Cancer Sister 57;s, died @ 89    breast    Social History History  Substance Use Topics  . Smoking status: Never Smoker   . Smokeless tobacco: Not on file  . Alcohol Use: No   she has been married to her husband jerry  retired for 26 years. They have no children. She works at [employer] as a Scientist, forensic  Allergies Allergies  Allergen Reactions  . Codeine Itching    Current Medications: Current Outpatient Prescriptions  Medication Sig Dispense Refill  . acetaminophen (TYLENOL) 500 MG tablet Take 500 mg by mouth every 6 (six) hours as needed. Take 2 500mg  po prn headacjhes      . amitriptyline (ELAVIL) 25 MG tablet Take 25 mg by mouth at bedtime.      Marland Kitchen amLODipine-benazepril (LOTREL) 5-20 MG per capsule Take 1 capsule by mouth daily.      Marland Kitchen aspirin 81 MG tablet Take 81 mg by mouth daily.      Marland Kitchen atenolol (TENORMIN) 100 MG tablet Take 100 mg by mouth daily.      Marland Kitchen buPROPion (WELLBUTRIN XL) 300 MG 24 hr tablet Take 300 mg by mouth daily.      Marland Kitchen  triamterene-hydrochlorothiazide (MAXZIDE) 75-50 MG per tablet Take 1 tablet by mouth daily.      . cloNIDine (CATAPRES) 0.1 MG tablet Take 0.1 mg by mouth 2 (two) times daily.        OB/GYN History: G0 P0 menarche age 57 last period 1999 had been on hormone replacement therapy up until 3 months ago initially with the pills and subsequently with a patch. Fertility Discussion: NA Prior History of Cancer: No  Health Maintenance:  Colonoscopy yes Bone Density no Last PAP smear no  ECOG PERFORMANCE STATUS: 0 - Asymptomatic  Genetic Counseling/testing: Pending  REVIEW OF SYSTEMS:  A comprehensive review of systems was  negative.  PHYSICAL EXAMINATION: Blood pressure 133/84, pulse 78, temperature 98.1 F (36.7 C), temperature source Oral, resp. rate 20, height 5' 9.5" (1.765 m), weight 236 lb 9.6 oz (107.321 kg).  HEENT:  Sclerae anicteric, conjunctivae pink.  Oropharynx clear.  No mucositis or candidiasis.  Nodes:  No cervical, supraclavicular, or axillary lymphadenopathy palpated.  Breast Exam:  Right breast is benign.  No masses, discharge, skin change, or nipple inversion.  Left breast is benign., There is some ecchymoses associated with the left breast I cannot detect any discreet masses No masses, discharge, skin change, or nipple inversion..  Lungs:  Clear to auscultation bilaterally.  No crackles, rhonchi, or wheezes.  Heart:  Regular rate and rhythm.  Abdomen:  Soft, nontender.  Positive bowel sounds.  No organomegaly or masses palpated.  Musculoskeletal:  No focal spinal tenderness to palpation.  Extremities:  Benign.  No peripheral edema or cyanosis.  Skin:  Benign.  Neuro:  Nonfocal.      STUDIES/RESULTS: Mr Breast Bilateral W Wo Contrast  12/16/2011  *RADIOLOGY REPORT*  Clinical Data: Recently diagnosed invasive mammary carcinoma in the left breast with an additional area of architectural distortion seen mammographically.  BILATERAL BREAST MRI WITH AND WITHOUT CONTRAST  Technique: Multiplanar, multisequence MR images of both breasts were obtained prior to and following the intravenous administration of 10ml of MultiHance.  Three dimensional images were evaluated at the independent DynaCad workstation.  Comparison:  Recent mammogram, ultrasound and biopsy examinations.  Findings: Minimal background parenchymal enhancement in both breasts.  1.2 x 1.2 x 0.9 cm enhancing mass with irregular margins in the anterior aspect of the left breast, laterally, in the 3 o'clock position.  This has a mixture of enhancement kinetics, including a small amount of rapid washin/washout.  This contains a biopsy marker clip  artifact and corresponds to the recently biopsied invasive mammary carcinoma.  Also demonstrated is an area of clumped linear and nodular enhancement more posteriorly in central left breast slightly superiorly and laterally.  This is in the region of the recently demonstrated architectural distortion and measures 7.6 x 4.9 x 2.5 cm.  This also has a mixture of enhancement kinetics, including rapid washin/washout.  The biopsy-proven malignancy and additional clumped linear and nodular enhancement in the left breast span an area measuring 10.9 cm in maximum diameter.  No additional masses or areas of enhancement suspicious for malignancy in either breast.  No abnormal appearing lymph nodes.  IMPRESSION:  1.  1.2 x 1.2 x 0.9 cm biopsy-proven invasive mammary carcinoma in the 3 o'clock position of the left breast. 2.  7.6 x 4.9 x 2.5 cm area of clumped linear and nodular enhancement deep in the central left breast slightly superiorly and laterally, corresponding to the area of architectural distortion at recent mammography.  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  MR guided core needle biopsy of this area is recommended.  RECOMMENDATION: Left breast MR guided core needle biopsy.  We will contact the patient to schedule the biopsy.  THREE-DIMENSIONAL MR IMAGE RENDERING ON INDEPENDENT WORKSTATION:  Three-dimensional MR images were rendered by post-processing of the original MR data on an independent workstation.  The three- dimensional MR images were interpreted, and findings were reported in the accompanying complete MRI report for this study.  BI-RADS CATEGORY 5:  Highly suggestive of malignancy - appropriate action should be taken.  Original Report Authenticated By: Darrol Angel, M.D.   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  These results were discussed the patient at 12:20 p.m. on 12/23/2011. The patient states that site is healing well without significant pain and minimal bruising. She is instructed to call the Breast Center with any further questions or concerns about the biopsy site.  Continue treatment plan is recommended.  Addended by:  Hiram Gash, M.D. on 12/23/2011 12:20:21.  **END ADDENDUM** SIGNED BY: Hiram Gash, M.D.   12/22/2011  *RADIOLOGY REPORT*  Clinical Data:  Known left breast cancer.  The patient returns for MR guided core biopsy of enhancement within the posterior lateral portion of the left breast.  MRI GUIDED VACUUM ASSISTED BIOPSY OF THE LEFT BREAST WITHOUT Comparison:  Technique: Multiplanar, multisequence MR images of the left breast were obtained. Contrast was not given, given the patient's history of elevated GFR.  I met with the patient, and we discussed the procedure of MRI guided biopsy, including risks, benefits, and alternatives. Specifically, we discussed the risks of infection, bleeding, tissue injury, clip migration, and inadequate sampling.  Informed, written consent was given.  Using sterile technique, 2% Lidocaine, MRI guidance, and a 9 gauge vacuum assisted device, biopsy was performed of the area of distortion matching the area of enhancement seen on the prior MRI.  At the conclusion of the procedure, a tissue marker clip was deployed into the biopsy cavity.  IMPRESSION: MRI guided biopsy of abnormal area within the posterior lateral portion of the left breast. No apparent complications.  THREE-DIMENSIONAL MR IMAGE RENDERING ON INDEPENDENT WORKSTATION:  Three-dimensional MR images were rendered by post-processing of the original MR data on an independent workstation.  The three- dimensional MR images were interpreted, and findings were reported in the accompanying complete MRI report for this study.  Original Report Authenticated By:  Hiram Gash, M.D.   Mm Digital Diagnostic Unilat L  12/22/2011  *RADIOLOGY REPORT*  Clinical Data:  Status post MR guided core biopsy of the left breast.  Known left breast malignancy based on recent biopsy.  DIGITAL DIAGNOSTIC LEFT MAMMOGRAM  Comparison:  12/07/2011 and earlier  Findings:  Films are performed following MRI guided biopsy of an area of distortion in the posterior lateral portion of the left breast.  An hourglass shaped clip is identified in the posterior lateral portion of the left breast corresponding to the MR guided core biopsy.  Clip is approximately 7.5 cm posterior to the known malignancy which is marked with a ribbon shaped clip.  IMPRESSION: Tissue marker clip is in expected location following MR guided core biopsy.  Original Report Authenticated By: Patterson Hammersmith, M.D.     LABS:    Chemistry      Component Value Date/Time   NA 138 01/10/2012  1534   K 4.1 01/10/2012 1534   CL 101 01/10/2012 1534   CO2 29 01/10/2012 1534   BUN 14 01/10/2012 1534   CREATININE 1.28* 01/10/2012 1534      Component Value Date/Time   CALCIUM 9.7 01/10/2012 1534   ALKPHOS 60 01/10/2012 1534   AST 20 01/10/2012 1534   ALT 19 01/10/2012 1534   BILITOT 0.3 01/10/2012 1534      Lab Results  Component Value Date   WBC 7.5 01/10/2012   HGB 11.9 01/10/2012   HCT 35.6 01/10/2012   MCV 94.0 01/10/2012   PLT 304 01/10/2012       PATHOLOGY: As above  ASSESSMENT    Multicentric ER/PR positive breast cancer in a postmenopausal woman. She has a number of first rubella those who have had breast cancer. She is due to have genetic testing on August 15. She is also due to have surgery likely a mastectomy and a about a week after that. We discussed going ahead with genetic testing. She is okay with going ahead and having a mastectomy though she is unclear as to whether she would not she wants to have bilateral mastectomies at this point. I discussed with her the role of adjuvant hormonal therapy in this  setting. I also discussed the use of adjuvant chemotherapy. I recommended that she had Oncotype testing to be performed after definitive surgery. We would then make decision about the any further chemotherapy. As far as radiation is concerned this point she may well need this is been no involved lymph nodes. I plan to see the patient back after she has had her surgery and genetic testing as well Oncotype testing. I have scheduled her for a bone density test.  Clinical Trial Eligibility: No Multidisciplinary conference discussion no    PLAN:    As above       Discussion: Patient is being treated per NCCN breast cancer care guidelines appropriate for stage.I/II   Thank you so much for allowing me to participate in the care of [facility]. I will continue to follow up the patient with you and assist in her care.  All questions were answered. The patient knows to call the clinic with any problems, questions or concerns. We can certainly see the patient much sooner if necessary.  I spent 55 minutes counseling the patient face to face. The total time spent in the appointment was 30 minutes.   01/10/2012, 5:58 PM

## 2012-01-12 ENCOUNTER — Telehealth: Payer: Self-pay | Admitting: Oncology

## 2012-01-12 NOTE — Telephone Encounter (Signed)
S/w the pt and she is aware of her sept appt. °

## 2012-01-17 ENCOUNTER — Encounter: Payer: Self-pay | Admitting: *Deleted

## 2012-01-17 NOTE — Progress Notes (Signed)
Mailed after appt letter to pt. 

## 2012-01-19 ENCOUNTER — Encounter: Payer: Self-pay | Admitting: Genetic Counselor

## 2012-01-19 ENCOUNTER — Ambulatory Visit (HOSPITAL_BASED_OUTPATIENT_CLINIC_OR_DEPARTMENT_OTHER): Payer: BC Managed Care – PPO | Admitting: Genetic Counselor

## 2012-01-19 ENCOUNTER — Encounter (HOSPITAL_BASED_OUTPATIENT_CLINIC_OR_DEPARTMENT_OTHER): Payer: Self-pay | Admitting: *Deleted

## 2012-01-19 ENCOUNTER — Other Ambulatory Visit: Payer: BC Managed Care – PPO | Admitting: Lab

## 2012-01-19 DIAGNOSIS — Z803 Family history of malignant neoplasm of breast: Secondary | ICD-10-CM

## 2012-01-19 DIAGNOSIS — C50919 Malignant neoplasm of unspecified site of unspecified female breast: Secondary | ICD-10-CM

## 2012-01-19 NOTE — Progress Notes (Signed)
To come in for lab- To bring all meds,overnight bag,arrive 0945-

## 2012-01-19 NOTE — Progress Notes (Signed)
Dr.  Donnie Coffin requested a consultation for genetic counseling and risk assessment for Catherine Atkins, a 61 y.o. female, for discussion of her breast cancer. She presents to clinic today to discuss the possibility of a genetic predisposition to cancer, and to further clarify her risks, as well as her family members' risks for cancer.   HISTORY OF PRESENT ILLNESS: In July 2013, at the age of 73, Catherine Atkins was diagnosed with breast cancer of the left breast.    Past Medical History  Diagnosis Date  . Depression   . Hypertension   . Cancer     lt breast  . Cancer of left breast 12/07/2011    Mammographically detectged, Dx by [facility] as IDC, Receptor+, Her 2 Negative  . Allergy     codeine  . Blood transfusion     years ago/    Past Surgical History  Procedure Date  . Abdominal hysterectomy 1989    b/l salpingoo=oppherecty  . Breast biopsy 12/08/11    Left breast 9 o'clock  bx=invasive mammary ca,ER/PR=+,IDC Her2 neg  . Colonoscopy   . Appendectomy     History  Substance Use Topics  . Smoking status: Never Smoker   . Smokeless tobacco: Not on file  . Alcohol Use: No    REPRODUCTIVE HISTORY AND PERSONAL RISK ASSESSMENT FACTORS: Menarche was at age 16.   Menopause at 29 with TAH-BSO Uterus Intact: No Ovaries Intact: No G0P0A0 , first live birth at age N/A  She has not previously undergone treatment for infertility.   OCP use for 3-5 years   She has used HRT in the past.    FAMILY HISTORY:  We obtained a detailed, 4-generation family history.  Significant diagnoses are listed below: Family History  Problem Relation Age of Onset  . Cancer Mother     breast  . Cancer Sister     breast  . Leukemia Father   . Heart attack Brother   . Prostate cancer Maternal Uncle   The patient was diagnosed with breast cancer at age 62.  She has one sister and three brothers.  Her sister died of breast cancer at age 38.  Her mother was diagnosed with breast cancer at age 36-88 and  died at 10.  A maternal uncle was diagnosed with prostate cancer.  Her father died of leukemia at 11.  Patient's maternal ancestors are of unknown descent, and paternal ancestors are of [place] descent. There is no reported Ashkenazi Jewish ancestry. There is no  known consanguinity.  GENETIC COUNSELING RISK ASSESSMENT, DISCUSSION, AND SUGGESTED FOLLOW UP: We reviewed the natural history and genetic etiology of sporadic, familial and hereditary cancer syndromes.  About 5-10% of breast cancer is hereditary.  Of this, about 85% is the result of a BRCA1 or BRCA2 mutation.  We reviewed the red flags of hereditary cancer syndromes and the dominant inheritance patterns.  The patient's personal history of breast cancer is suggestive of the following possible diagnosis: hereditary vs.familial breast cancer.  We discussed that identification of a hereditary cancer syndrome may help her care providers tailor the patients medical management. If a mutation indicating a hereditary cancer syndrome is detected in this case, the [employer] recommendations would include increased cancer surveillance and possible prophylactic surgery. If a mutation is detected, the patient will be referred back to the referring provider and to any additional appropriate care providers to discuss the relevant options.   If a mutation is not found in the  patient, this will decrease the likelihood of a hereditary cancer syndrome as the explanation for her breast cancer. Cancer surveillance options would be discussed for the patient according to the appropriate standard National Comprehensive Cancer Network and American Cancer Society guidelines, with consideration of their personal and family history risk factors. In this case, the patient will be referred back to their care providers for discussions of management.   In order to estimate her chance of having a BRCA1 or BRCA2 mutation, we used statistical models  (Penn II) and laboratory data that take into account her personal medical history, family history and ancestry.  Because each model is different, there can be a lot of variability in the risks they give.  Therefore, these numbers must be considered a rough range and not a precise risk of having a BRCA1 or BRCA2 mutation.  These models estimate that she has approximately a 7% chance of having a mutation. Based on this assessment of her family and personal history, genetic testing is recommended.  After considering the risks, benefits, and limitations, the patient provided informed consent for  the following  testing: [facility] through [facility].   Per the patient's request, we will contact her by telephone to discuss these results. A follow up genetic counseling visit will be scheduled if indicated.  The patient was seen for a total of 60 minutes, greater than 50% of which was spent face-to-face counseling.  This plan is being carried out per Dr. Donnie Coffin recommendations.  This note will also be sent to the referring provider via the electronic medical record. The patient will be supplied with a summary of this genetic counseling discussion as well as educational information on the discussed hereditary cancer syndromes following the conclusion of their visit.   Patient was discussed with Dr. Drue Second.  _______________________________________________________________________ For Office Staff:  Number of people involved in session: 3 Was an Intern/ student involved with case: not applicable

## 2012-01-23 ENCOUNTER — Encounter (INDEPENDENT_AMBULATORY_CARE_PROVIDER_SITE_OTHER): Payer: Self-pay | Admitting: Surgery

## 2012-01-23 ENCOUNTER — Telehealth (INDEPENDENT_AMBULATORY_CARE_PROVIDER_SITE_OTHER): Payer: Self-pay

## 2012-01-23 ENCOUNTER — Encounter (HOSPITAL_BASED_OUTPATIENT_CLINIC_OR_DEPARTMENT_OTHER)
Admission: RE | Admit: 2012-01-23 | Discharge: 2012-01-23 | Disposition: A | Discharge status: Home / Self Care | Payer: BC Managed Care – PPO | Source: Ambulatory Visit | Attending: Surgery | Admitting: Surgery

## 2012-01-23 ENCOUNTER — Ambulatory Visit (INDEPENDENT_AMBULATORY_CARE_PROVIDER_SITE_OTHER): Payer: BC Managed Care – PPO | Admitting: Surgery

## 2012-01-23 ENCOUNTER — Other Ambulatory Visit (INDEPENDENT_AMBULATORY_CARE_PROVIDER_SITE_OTHER): Payer: Self-pay | Admitting: Surgery

## 2012-01-23 ENCOUNTER — Ambulatory Visit
Admission: RE | Admit: 2012-01-23 | Discharge: 2012-01-23 | Disposition: A | Discharge status: Home / Self Care | Payer: BC Managed Care – PPO | Source: Ambulatory Visit | Attending: Surgery | Admitting: Surgery

## 2012-01-23 VITALS — BP 122/82 | HR 68 | Temp 97.5°F | Resp 16 | Ht 69.0 in | Wt 235.0 lb

## 2012-01-23 DIAGNOSIS — C50912 Malignant neoplasm of unspecified site of left female breast: Secondary | ICD-10-CM

## 2012-01-23 DIAGNOSIS — Z01818 Encounter for other preprocedural examination: Secondary | ICD-10-CM

## 2012-01-23 DIAGNOSIS — C50919 Malignant neoplasm of unspecified site of unspecified female breast: Secondary | ICD-10-CM

## 2012-01-23 LAB — COMPREHENSIVE METABOLIC PANEL
Albumin: 3.8 g/dL (ref 3.5–5.2)
Alkaline Phosphatase: 64 U/L (ref 39–117)
BUN: 15 mg/dL (ref 6–23)
CO2: 26 mEq/L (ref 19–32)
Creatinine, Ser: 1.16 mg/dL — ABNORMAL HIGH (ref 0.50–1.10)
GFR calc non Af Amer: 49 mL/min — ABNORMAL LOW (ref >90)
Glucose, Bld: 106 mg/dL — ABNORMAL HIGH (ref 70–99)
Sodium: 139 mEq/L (ref 135–145)
Total Bilirubin: 0.2 mg/dL — ABNORMAL LOW (ref 0.3–1.2)

## 2012-01-23 LAB — URINALYSIS, ROUTINE W REFLEX MICROSCOPIC
Glucose, UA: NEGATIVE mg/dL
Ketones, ur: NEGATIVE mg/dL
Protein, ur: NEGATIVE mg/dL
Urobilinogen, UA: 0.2 mg/dL (ref 0.0–1.0)

## 2012-01-23 LAB — CBC WITH DIFFERENTIAL/PLATELET
Basophils Absolute: 0.1 10*3/uL (ref 0.0–0.1)
Eosinophils Absolute: 0.1 10*3/uL (ref 0.0–0.7)
Eosinophils Relative: 1 % (ref 0–5)
MCH: 32.1 pg (ref 26.0–34.0)
MCHC: 34.1 g/dL (ref 30.0–36.0)
MCV: 94.3 fL (ref 78.0–100.0)
Platelets: 295 10*3/uL (ref 150–400)
RDW: 13.2 % (ref 11.5–15.5)
WBC: 8.4 10*3/uL (ref 4.0–10.5)

## 2012-01-23 LAB — URINE MICROSCOPIC-ADD ON

## 2012-01-23 NOTE — Telephone Encounter (Signed)
Per Dr Jamey Ripa it is ok for them to not repeat the lab.  I called and notified Cindy at [facility].

## 2012-01-23 NOTE — Progress Notes (Signed)
CC: Discuss breast surgery  HPI: The patient was diagnosed with a left breast cancer and on additional w/u was found to be multicentric. Therefore mastectomy was recommended and she is scheduled for Wednesday as a total mastectomy and SLN. She is awaiting results of genetic tests but does not wish to postpone surgery while these are pending.  She has had no intercurrent illnesses since last seen by me.  PH,FH,ROS are all in Epic and reviewed  PE; VITAL SIGNS: BP 122/82  Pulse 68  Temp 97.5 F (36.4 C)  Resp 16  Ht 5' 9" (1.753 m)  Wt 235 lb (106.595 kg)  BMI 34.70 kg/m2 GENERAL:  The patient is alert, oriented, and generally healthy-appearing, NAD. Mood and affect are normal.  HEENT:  The head is normocephalic, the eyes nonicteric, the pupils were round regular and equal. EOMs are normal. Pharynx normal. Dentition good.  NECK:  The neck is supple and there are no masses or thyromegaly.  LUNGS: Normal respirations and clear to auscultation.  HEART: Regular rhythm, with no murmurs rubs or gallops. Pulses are intact carotid dorsalis pedis and posterior tibial. No significant varicosities are noted.  BREASTS: Normal exam today  ABDOMEN: Soft, flat, and nontender. No masses or organomegaly is noted. No hernias are noted. Bowel sounds are normal.  EXTREMITIES:  Good range of motion, no edema.  IMP: Multifocal IDC, left breast.  PlaN: Mastectomy and SLN as scheduled. Reviewed procedure, risks and post op course with patient and questions answered. 

## 2012-01-23 NOTE — Telephone Encounter (Signed)
The patient is scheduled for surgery 8/21 and Dr Jamey Ripa ordered a CA 27.29.  She had one drawn 8/6 at the [facility].  Can they use this?

## 2012-01-23 NOTE — Patient Instructions (Signed)
I will see you Wednesday at the surgery center for your surgery

## 2012-01-24 ENCOUNTER — Encounter: Payer: Self-pay | Admitting: Genetic Counselor

## 2012-01-25 ENCOUNTER — Ambulatory Visit (HOSPITAL_BASED_OUTPATIENT_CLINIC_OR_DEPARTMENT_OTHER)
Admission: RE | Admit: 2012-01-25 | Discharge: 2012-01-26 | Disposition: A | Discharge status: Home / Self Care | Payer: BC Managed Care – PPO | Source: Ambulatory Visit | Attending: Surgery | Admitting: Surgery

## 2012-01-25 ENCOUNTER — Ambulatory Visit (HOSPITAL_COMMUNITY)
Admission: RE | Admit: 2012-01-25 | Discharge: 2012-01-25 | Disposition: A | Discharge status: Home / Self Care | Payer: BC Managed Care – PPO | Source: Ambulatory Visit | Attending: Surgery | Admitting: Surgery

## 2012-01-25 ENCOUNTER — Encounter (HOSPITAL_BASED_OUTPATIENT_CLINIC_OR_DEPARTMENT_OTHER): Payer: Self-pay | Admitting: Certified Registered Nurse Anesthetist

## 2012-01-25 ENCOUNTER — Ambulatory Visit (HOSPITAL_BASED_OUTPATIENT_CLINIC_OR_DEPARTMENT_OTHER): Payer: BC Managed Care – PPO | Admitting: Certified Registered Nurse Anesthetist

## 2012-01-25 ENCOUNTER — Encounter (HOSPITAL_BASED_OUTPATIENT_CLINIC_OR_DEPARTMENT_OTHER)
Admission: RE | Disposition: A | Discharge status: Home / Self Care | Payer: Self-pay | Source: Ambulatory Visit | Attending: Surgery

## 2012-01-25 ENCOUNTER — Encounter (HOSPITAL_BASED_OUTPATIENT_CLINIC_OR_DEPARTMENT_OTHER): Payer: Self-pay | Admitting: *Deleted

## 2012-01-25 DIAGNOSIS — C50419 Malignant neoplasm of upper-outer quadrant of unspecified female breast: Secondary | ICD-10-CM | POA: Insufficient documentation

## 2012-01-25 DIAGNOSIS — C50912 Malignant neoplasm of unspecified site of left female breast: Secondary | ICD-10-CM

## 2012-01-25 DIAGNOSIS — I10 Essential (primary) hypertension: Secondary | ICD-10-CM | POA: Insufficient documentation

## 2012-01-25 DIAGNOSIS — C50919 Malignant neoplasm of unspecified site of unspecified female breast: Secondary | ICD-10-CM

## 2012-01-25 HISTORY — DX: Malignant neoplasm of unspecified site of unspecified female breast: C50.919

## 2012-01-25 HISTORY — PX: MASTECTOMY: SHX3

## 2012-01-25 SURGERY — SIMPLE MASTECTOMY WITH AXILLARY SENTINEL NODE BIOPSY
Anesthesia: General | Site: Breast | Laterality: Left | Wound class: Clean

## 2012-01-25 MED ORDER — ONDANSETRON HCL 4 MG/2ML IJ SOLN: 4.0000 mg | Freq: Four times a day (QID) | INTRAMUSCULAR | Status: DC | PRN

## 2012-01-25 MED ORDER — TECHNETIUM TC 99M SULFUR COLLOID FILTERED
1.0000 | Freq: Once | INTRAVENOUS | Status: AC | PRN
  Administered 2012-01-25: 1 via INTRADERMAL

## 2012-01-25 MED ORDER — EPHEDRINE SULFATE 50 MG/ML IJ SOLN
INTRAMUSCULAR | Status: DC | PRN
  Administered 2012-01-25 (×5): 10 mg via INTRAVENOUS

## 2012-01-25 MED ORDER — PROMETHAZINE HCL 25 MG/ML IJ SOLN: 6.2500 mg | INTRAMUSCULAR | Status: DC | PRN

## 2012-01-25 MED ORDER — CEFAZOLIN SODIUM-DEXTROSE 2-3 GM-% IV SOLR
2.0000 g | INTRAVENOUS | Status: AC
  Administered 2012-01-25: 2 g via INTRAVENOUS

## 2012-01-25 MED ORDER — LIDOCAINE HCL (CARDIAC) 20 MG/ML IV SOLN
INTRAVENOUS | Status: DC | PRN
  Administered 2012-01-25: 75 mg via INTRAVENOUS

## 2012-01-25 MED ORDER — OXYCODONE-ACETAMINOPHEN 5-325 MG PO TABS
1.0000 | ORAL_TABLET | ORAL | Status: DC | PRN
  Administered 2012-01-25: 1 via ORAL
  Administered 2012-01-25 – 2012-01-26 (×3): 2 via ORAL

## 2012-01-25 MED ORDER — DIPHENHYDRAMINE HCL 50 MG/ML IJ SOLN: 25.0000 mg | Freq: Three times a day (TID) | INTRAMUSCULAR | Status: DC | PRN

## 2012-01-25 MED ORDER — KCL IN DEXTROSE-NACL 20-5-0.45 MEQ/L-%-% IV SOLN
INTRAVENOUS | Status: DC
  Administered 2012-01-25: 16:00:00 via INTRAVENOUS

## 2012-01-25 MED ORDER — FENTANYL CITRATE 0.05 MG/ML IJ SOLN
50.0000 ug | INTRAMUSCULAR | Status: DC | PRN
  Administered 2012-01-25: 100 ug via INTRAVENOUS

## 2012-01-25 MED ORDER — AMITRIPTYLINE HCL 25 MG PO TABS: 25.0000 mg | ORAL_TABLET | Freq: Every day | ORAL | Status: DC

## 2012-01-25 MED ORDER — DEXAMETHASONE SODIUM PHOSPHATE 4 MG/ML IJ SOLN
INTRAMUSCULAR | Status: DC | PRN
  Administered 2012-01-25: 10 mg via INTRAVENOUS

## 2012-01-25 MED ORDER — CLONIDINE HCL 0.1 MG PO TABS: 0.1000 mg | ORAL_TABLET | Freq: Two times a day (BID) | ORAL | Status: DC

## 2012-01-25 MED ORDER — SODIUM CHLORIDE 0.9 % IJ SOLN
INTRAMUSCULAR | Status: DC | PRN
  Administered 2012-01-25: 12:00:00 via INTRAMUSCULAR

## 2012-01-25 MED ORDER — CHLORHEXIDINE GLUCONATE 4 % EX LIQD: 1.0000 "application " | Freq: Once | CUTANEOUS | Status: DC

## 2012-01-25 MED ORDER — ATENOLOL 100 MG PO TABS: 100.0000 mg | ORAL_TABLET | Freq: Every day | ORAL | Status: DC

## 2012-01-25 MED ORDER — MIDAZOLAM HCL 5 MG/5ML IJ SOLN
INTRAMUSCULAR | Status: DC | PRN
  Administered 2012-01-25: 1 mg via INTRAVENOUS

## 2012-01-25 MED ORDER — HYDROMORPHONE HCL PF 1 MG/ML IJ SOLN
0.2500 mg | INTRAMUSCULAR | Status: DC | PRN
  Administered 2012-01-25 (×4): 0.25 mg via INTRAVENOUS

## 2012-01-25 MED ORDER — ACETAMINOPHEN 10 MG/ML IV SOLN
1000.0000 mg | Freq: Once | INTRAVENOUS | Status: AC
  Administered 2012-01-25: 1000 mg via INTRAVENOUS

## 2012-01-25 MED ORDER — PROPOFOL 10 MG/ML IV EMUL
INTRAVENOUS | Status: DC | PRN
  Administered 2012-01-25: 200 mg via INTRAVENOUS

## 2012-01-25 MED ORDER — LACTATED RINGERS IV SOLN
INTRAVENOUS | Status: DC
  Administered 2012-01-25 (×2): via INTRAVENOUS

## 2012-01-25 MED ORDER — DIPHENHYDRAMINE HCL 25 MG PO CAPS
25.0000 mg | ORAL_CAPSULE | Freq: Three times a day (TID) | ORAL | Status: DC | PRN
  Administered 2012-01-26: 25 mg via ORAL

## 2012-01-25 MED ORDER — MIDAZOLAM HCL 2 MG/2ML IJ SOLN: 0.5000 mg | Freq: Once | INTRAMUSCULAR | Status: DC | PRN

## 2012-01-25 MED ORDER — DIPHENHYDRAMINE HCL 50 MG/ML IJ SOLN
25.0000 mg | Freq: Three times a day (TID) | INTRAMUSCULAR | Status: DC
  Administered 2012-01-25: 25 mg via INTRAVENOUS

## 2012-01-25 MED ORDER — TRIAMTERENE-HCTZ 75-50 MG PO TABS: 1.0000 | ORAL_TABLET | Freq: Every day | ORAL | Status: DC

## 2012-01-25 MED ORDER — BUPROPION HCL ER (XL) 300 MG PO TB24: 300.0000 mg | ORAL_TABLET | Freq: Every day | ORAL | Status: DC

## 2012-01-25 MED ORDER — KCL-LACTATED RINGERS-D5W 20 MEQ/L IV SOLN: INTRAVENOUS | Status: DC

## 2012-01-25 MED ORDER — MIDAZOLAM HCL 2 MG/2ML IJ SOLN
1.0000 mg | INTRAMUSCULAR | Status: DC | PRN
  Administered 2012-01-25: 2 mg via INTRAVENOUS

## 2012-01-25 MED ORDER — ONDANSETRON HCL 4 MG/2ML IJ SOLN
INTRAMUSCULAR | Status: DC | PRN
  Administered 2012-01-25: 4 mg via INTRAVENOUS

## 2012-01-25 MED ORDER — MEPERIDINE HCL 25 MG/ML IJ SOLN: 6.2500 mg | INTRAMUSCULAR | Status: DC | PRN

## 2012-01-25 MED ORDER — ONDANSETRON HCL 4 MG PO TABS: 4.0000 mg | ORAL_TABLET | Freq: Four times a day (QID) | ORAL | Status: DC | PRN

## 2012-01-25 MED ORDER — HYDROMORPHONE HCL PF 2 MG/ML IJ SOLN
2.0000 mg | INTRAMUSCULAR | Status: DC | PRN
  Administered 2012-01-25: 2 mg via INTRAVENOUS

## 2012-01-25 MED ORDER — GLYCOPYRROLATE 0.2 MG/ML IJ SOLN
INTRAMUSCULAR | Status: DC | PRN
  Administered 2012-01-25: 0.4 mg via INTRAVENOUS

## 2012-01-25 MED ORDER — FENTANYL CITRATE 0.05 MG/ML IJ SOLN
INTRAMUSCULAR | Status: DC | PRN
  Administered 2012-01-25: 100 ug via INTRAVENOUS
  Administered 2012-01-25: 25 ug via INTRAVENOUS

## 2012-01-25 SURGICAL SUPPLY — 63 items
ADH SKN CLS APL DERMABOND .7 (GAUZE/BANDAGES/DRESSINGS) ×1
APPLIER CLIP 11 MED OPEN (CLIP) ×2
APPLIER CLIP 9.375 MED OPEN (MISCELLANEOUS)
APR CLP MED 11 20 MLT OPN (CLIP) ×1
APR CLP MED 9.3 20 MLT OPN (MISCELLANEOUS)
BANDAGE ELASTIC 6 VELCRO ST LF (GAUZE/BANDAGES/DRESSINGS) ×1 IMPLANT
BINDER BREAST XXLRG (GAUZE/BANDAGES/DRESSINGS) ×1 IMPLANT
BIOPATCH RED 1 DISK 7.0 (GAUZE/BANDAGES/DRESSINGS) ×2 IMPLANT
BLADE HEX COATED 2.75 (ELECTRODE) ×2 IMPLANT
BLADE SURG 10 STRL SS (BLADE) ×2 IMPLANT
BLADE SURG 15 STRL LF DISP TIS (BLADE) ×1 IMPLANT
BLADE SURG 15 STRL SS (BLADE) ×2
BLADE SURG ROTATE 9660 (MISCELLANEOUS) IMPLANT
CANISTER SUCTION 1200CC (MISCELLANEOUS) ×2 IMPLANT
CHLORAPREP W/TINT 26ML (MISCELLANEOUS) ×2 IMPLANT
CLIP APPLIE 11 MED OPEN (CLIP) IMPLANT
CLIP APPLIE 9.375 MED OPEN (MISCELLANEOUS) IMPLANT
CLOTH BEACON ORANGE TIMEOUT ST (SAFETY) ×2 IMPLANT
COVER MAYO STAND STRL (DRAPES) ×2 IMPLANT
COVER PROBE W GEL 5X96 (DRAPES) ×2 IMPLANT
COVER TABLE BACK 60X90 (DRAPES) ×2 IMPLANT
DECANTER SPIKE VIAL GLASS SM (MISCELLANEOUS) IMPLANT
DERMABOND ADVANCED (GAUZE/BANDAGES/DRESSINGS) ×1
DERMABOND ADVANCED .7 DNX12 (GAUZE/BANDAGES/DRESSINGS) ×1 IMPLANT
DRAIN CHANNEL 19F RND (DRAIN) ×2 IMPLANT
DRAPE LAPAROSCOPIC ABDOMINAL (DRAPES) ×1 IMPLANT
DRAPE SURG 17X23 STRL (DRAPES) ×1 IMPLANT
DRAPE U-SHAPE 76X120 STRL (DRAPES) IMPLANT
DRAPE UTILITY XL STRL (DRAPES) ×2 IMPLANT
DRSG TEGADERM 2-3/8X2-3/4 SM (GAUZE/BANDAGES/DRESSINGS) ×2 IMPLANT
ELECT BLADE 4.0 EZ CLEAN MEGAD (MISCELLANEOUS) ×2
ELECT REM PT RETURN 9FT ADLT (ELECTROSURGICAL) ×2
ELECTRODE BLDE 4.0 EZ CLN MEGD (MISCELLANEOUS) ×1 IMPLANT
ELECTRODE REM PT RTRN 9FT ADLT (ELECTROSURGICAL) ×1 IMPLANT
EVACUATOR SILICONE 100CC (DRAIN) ×2 IMPLANT
GLOVE EUDERMIC 7 POWDERFREE (GLOVE) ×3 IMPLANT
GOWN PREVENTION PLUS XLARGE (GOWN DISPOSABLE) ×4 IMPLANT
NDL HYPO 25X1 1.5 SAFETY (NEEDLE) ×2 IMPLANT
NDL SAFETY ECLIPSE 18X1.5 (NEEDLE) ×1 IMPLANT
NEEDLE HYPO 18GX1.5 SHARP (NEEDLE) ×2
NEEDLE HYPO 25X1 1.5 SAFETY (NEEDLE) ×4 IMPLANT
NS IRRIG 1000ML POUR BTL (IV SOLUTION) ×2 IMPLANT
PACK BASIN DAY SURGERY FS (CUSTOM PROCEDURE TRAY) ×2 IMPLANT
PEN SKIN MARKING BROAD TIP (MISCELLANEOUS) ×1 IMPLANT
PENCIL BUTTON HOLSTER BLD 10FT (ELECTRODE) ×2 IMPLANT
PIN SAFETY STERILE (MISCELLANEOUS) ×2 IMPLANT
SHEET MEDIUM DRAPE 40X70 STRL (DRAPES) ×2 IMPLANT
SLEEVE SCD COMPRESS KNEE MED (MISCELLANEOUS) ×2 IMPLANT
SPONGE GAUZE 4X4 12PLY (GAUZE/BANDAGES/DRESSINGS) ×2 IMPLANT
SPONGE LAP 18X18 X RAY DECT (DISPOSABLE) ×4 IMPLANT
SPONGE LAP 4X18 X RAY DECT (DISPOSABLE) ×2 IMPLANT
STAPLER VISISTAT 35W (STAPLE) ×2 IMPLANT
SUT ETHILON 2 0 FS 18 (SUTURE) ×1 IMPLANT
SUT MNCRL AB 3-0 PS2 18 (SUTURE) ×4 IMPLANT
SUT MNCRL AB 4-0 PS2 18 (SUTURE) IMPLANT
SUT SILK 2 0 SH (SUTURE) IMPLANT
SUT VICRYL 3-0 CR8 SH (SUTURE) ×2 IMPLANT
SYR CONTROL 10ML LL (SYRINGE) ×4 IMPLANT
TOWEL OR 17X24 6PK STRL BLUE (TOWEL DISPOSABLE) ×4 IMPLANT
TOWEL OR NON WOVEN STRL DISP B (DISPOSABLE) ×2 IMPLANT
TUBE CONNECTING 20X1/4 (TUBING) ×2 IMPLANT
WATER STERILE IRR 1000ML POUR (IV SOLUTION) ×1 IMPLANT
YANKAUER SUCT BULB TIP NO VENT (SUCTIONS) ×2 IMPLANT

## 2012-01-25 NOTE — Progress Notes (Signed)
Post Nuc med procedure VSS tol well. Family in

## 2012-01-25 NOTE — Interval H&P Note (Signed)
History and Physical Interval Note:  01/25/2012 11:11 AM  Catherine Atkins  has presented today for surgery, with the diagnosis of left breast cancer  The various methods of treatment have been discussed with the patient and family. After consideration of risks, benefits and other options for treatment, the patient has consented to  Procedure(s) (LRB): SIMPLE MASTECTOMY WITH AXILLARY SENTINEL NODE BIOPSY (Left) as a surgical intervention .  The patient's history has been reviewed, patient examined, no change in status, stable for surgery.  I have reviewed the patient's chart and labs.  Questions were answered to the patient's satisfaction.     Tymira Horkey J

## 2012-01-25 NOTE — Anesthesia Postprocedure Evaluation (Signed)
  Anesthesia Post-op Note  Patient: Catherine Atkins  Procedure(s) Performed: Procedure(s) (LRB): SIMPLE MASTECTOMY WITH AXILLARY SENTINEL NODE BIOPSY (Left)  Patient Location: PACU  Anesthesia Type: General  Level of Consciousness: awake, alert  and oriented  Airway and Oxygen Therapy: Patient Spontanous Breathing  Post-op Pain: mild  Post-op Assessment: Post-op Vital signs reviewed, Patient's Cardiovascular Status Stable, Respiratory Function Stable, Patent Airway, No signs of Nausea or vomiting and Pain level controlled  Post-op Vital Signs: Reviewed and stable  Complications: No apparent anesthesia complications

## 2012-01-25 NOTE — Transfer of Care (Signed)
Immediate Anesthesia Transfer of Care Note  Patient: Catherine Atkins  Procedure(s) Performed: Procedure(s) (LRB): SIMPLE MASTECTOMY WITH AXILLARY SENTINEL NODE BIOPSY (Left)  Patient Location: PACU  Anesthesia Type: General  Level of Consciousness: awake and patient cooperative  Airway & Oxygen Therapy: Patient Spontanous Breathing and Patient connected to face mask oxygen  Post-op Assessment: Report given to PACU RN and Post -op Vital signs reviewed and stable  Post vital signs: Reviewed and stable  Complications: No apparent anesthesia complications

## 2012-01-25 NOTE — Op Note (Signed)
Catherine Atkins [facility] [birth year] [id number] 12/30/2011  Preoperative diagnosis: Multifocal invasive ductal carcinoma, left breast, clinical stage I  Postoperative diagnosis: Same  Procedure: Left total mastectomy with the ejection and axillary sentinel lymph node biopsy  Surgeon: Currie Paris   Assistant surgeon: Dr. Delane Ginger  Anesthesia:General  Clinical History and Indications:The patient is seen in the holding area and we reviewed the plans for the procedure as noted above. We reviewed the risks and complications a final time. She had no further questions. I marked theleft side as the operative side.  Description of Procedure: The patient was taken to the operating room. After satisfactory general anesthesia was obtained the timeout was done.   I then injected 5 cc of dilute methylene blue and injected it subareaorly and massaged it in. A full prep and drape was then done.  I outlined an elliptical incision and marked the inframammary fold and midline. The incision was made. The usual skin flaps were raised. I went to the clavicle superiorly and sternum medially and towards the latissimus laterally.    After the superior flap was complete I was able to use the neoprobe to locate a sentinel node.  -Removed 3 lymph nodes.  Once the node was removed it was forwarded to pathology.   The inferior flap was then made going to the inframammary fold and out to the latissimus. The breast was then removed from the pectoralis starting medially and working laterally. When I got to the lateral aspect of the pectoralis major muscle I opened the clavipectoral fascia. I finished removing the breast from the serratus muscles.  At this point the pathologist reported on the sentinel node and that they were negative.  I therefore completed the mastectomy by dividing the remaining attachments from the breast to the chest wall and latissimus but not taking any more tissue from the axilla. The  specimen was marked to orient it for the pathologist and passed off the table.  I spent several minutes irrigating and making sure everything was dry. I then placed a 6 [place] Blake drain through a small incision in the inferior flap and laid along the pectoralis muscle. It was secured with a 2-0 nylon suture.  Another irrigation and check for hemostasis was made. Everything appeared to be dry. Incision was then closed with interrupted 3-0 Monocryl and Dermabond.   The patient tolerated the procedure well. There were no operative complications. All counts were correct. Estimated blood loss was minimal, about 75 cc. Sterile dressings were applied and the patient taken to the PACU in satisfactory condition.  Currie Paris, MD, FACS 01/25/2012 1:24 PM

## 2012-01-25 NOTE — Anesthesia Preprocedure Evaluation (Signed)
Anesthesia Evaluation  Patient identified by MRN, date of birth, ID band Patient awake    Reviewed: Allergy & Precautions, H&P , NPO status , Patient's Chart, lab work & pertinent test results, reviewed documented beta blocker date and time   History of Anesthesia Complications Negative for: history of anesthetic complications  Airway Mallampati: II TM Distance: >3 FB Neck ROM: Full    Dental No notable dental hx. (+) Teeth Intact and Dental Advisory Given   Pulmonary neg pulmonary ROS,  breath sounds clear to auscultation  Pulmonary exam normal       Cardiovascular hypertension, Pt. on medications and Pt. on home beta blockers Rhythm:Regular Rate:Normal     Neuro/Psych negative neurological ROS     GI/Hepatic negative GI ROS, Neg liver ROS,   Endo/Other  Morbid obesity  Renal/GU negative Renal ROS     Musculoskeletal   Abdominal (+) + obese,   Peds  Hematology   Anesthesia Other Findings   Reproductive/Obstetrics                           Anesthesia Physical Anesthesia Plan  ASA: II  Anesthesia Plan: General   Post-op Pain Management:    Induction: Intravenous  Airway Management Planned: LMA  Additional Equipment:   Intra-op Plan:   Post-operative Plan:   Informed Consent: I have reviewed the patients History and Physical, chart, labs and discussed the procedure including the risks, benefits and alternatives for the proposed anesthesia with the patient or authorized representative who has indicated his/her understanding and acceptance.   Dental advisory given  Plan Discussed with: CRNA and Surgeon  Anesthesia Plan Comments: (Plan routine monitors, GA- LMA OK)        Anesthesia Quick Evaluation

## 2012-01-25 NOTE — H&P (View-Only) (Signed)
CC: Discuss breast surgery  HPI: The patient was diagnosed with a left breast cancer and on additional w/u was found to be multicentric. Therefore mastectomy was recommended and she is scheduled for Wednesday as a total mastectomy and SLN. She is awaiting results of genetic tests but does not wish to postpone surgery while these are pending.  She has had no intercurrent illnesses since last seen by me.  PH,FH,ROS are all in Epic and reviewed  PE; VITAL SIGNS: BP 122/82  Pulse 68  Temp 97.5 F (36.4 C)  Resp 16  Ht 5\' 9"  (1.753 m)  Wt 235 lb (106.595 kg)  BMI 34.70 kg/m2 GENERAL:  The patient is alert, oriented, and generally healthy-appearing, NAD. Mood and affect are normal.  HEENT:  The head is normocephalic, the eyes nonicteric, the pupils were round regular and equal. EOMs are normal. Pharynx normal. Dentition good.  NECK:  The neck is supple and there are no masses or thyromegaly.  LUNGS: Normal respirations and clear to auscultation.  HEART: Regular rhythm, with no murmurs rubs or gallops. Pulses are intact carotid dorsalis pedis and posterior tibial. No significant varicosities are noted.  BREASTS: Normal exam today  ABDOMEN: Soft, flat, and nontender. No masses or organomegaly is noted. No hernias are noted. Bowel sounds are normal.  EXTREMITIES:  Good range of motion, no edema.  IMP: Multifocal IDC, left breast.  PlaN: Mastectomy and SLN as scheduled. Reviewed procedure, risks and post op course with patient and questions answered.

## 2012-01-25 NOTE — Anesthesia Procedure Notes (Signed)
Procedure Name: LMA Insertion Date/Time: 01/25/2012 11:39 AM Performed by: Atlas Crossland D Pre-anesthesia Checklist: Patient identified, Emergency Drugs available, Suction available and Patient being monitored Patient Re-evaluated:Patient Re-evaluated prior to inductionOxygen Delivery Method: Circle System Utilized Preoxygenation: Pre-oxygenation with 100% oxygen Intubation Type: IV induction Ventilation: Mask ventilation without difficulty LMA: LMA inserted LMA Size: 5.0 Number of attempts: 1 Placement Confirmation: positive ETCO2 Tube secured with: Tape Dental Injury: Teeth and Oropharynx as per pre-operative assessment

## 2012-01-26 ENCOUNTER — Telehealth: Payer: Self-pay | Admitting: Genetic Counselor

## 2012-01-26 MED ORDER — OXYCODONE-ACETAMINOPHEN 5-325 MG PO TABS: 1.0000 | ORAL_TABLET | ORAL | Status: DC | PRN

## 2012-01-26 NOTE — Progress Notes (Signed)
1 Day Post-Op  Subjective: Feels OK not much pain, wants to go home. Mild itch with dilaudid, none with oxycodone. Can manage JP  Objective: Vital signs in last 24 hours: Temp:  [97 F (36.1 C)-98.5 F (36.9 C)] 98.1 F (36.7 C) (08/22 0500) Pulse Rate:  [64-82] 70  (08/22 0500) Resp:  [9-22] 16  (08/22 0500) BP: (102-140)/(62-85) 114/68 mmHg (08/22 0500) SpO2:  [95 %-100 %] 98 % (08/22 0500) Weight:  [236 lb 8 oz (107.276 kg)] 236 lb 8 oz (107.276 kg) (08/21 7829)   Intake/Output from previous day: 08/21 0701 - 08/22 0700 In: 5521.5 [P.O.:3244; I.V.:2277.5] Out: 1685 [Urine:1500; Drains:185] Intake/Output this shift:     General appearance: alert and no distress Resp: clear to auscultation bilaterally  Incision: healing well, JP thin  Lab Results:   Basename 01/23/12 0915  WBC 8.4  HGB 12.4  HCT 36.4  PLT 295   BMET  Basename 01/23/12 0915  NA 139  K 4.6  CL 102  CO2 26  GLUCOSE 106*  BUN 15  CREATININE 1.16*  CALCIUM 9.7   PT/INR No results found for this basename: LABPROT:2,INR:2 in the last 72 hours ABG No results found for this basename: PHART:2,PCO2:2,PO2:2,HCO3:2 in the last 72 hours  MEDS, Scheduled    . acetaminophen  1,000 mg Intravenous Once  . amitriptyline  25 mg Oral QHS  . atenolol  100 mg Oral Daily  . buPROPion  300 mg Oral Daily  .  ceFAZolin (ANCEF) IV  2 g Intravenous 60 min Pre-Op  . cloNIDine  0.1 mg Oral BID  . triamterene-hydrochlorothiazide  1 tablet Oral Daily  . DISCONTD: chlorhexidine  1 application Topical Once  . DISCONTD: chlorhexidine  1 application Topical Once  . DISCONTD: diphenhydrAMINE  25 mg Intravenous Q8H    Studies/Results: Nm Sentinel Node Inj-no Rpt (breast)  01/25/2012  CLINICAL DATA: Left breast cancer   Sulfur colloid was injected intradermally by the nuclear medicine  technologist for breast cancer sentinel node localization.      Assessment: s/p Procedure(s): SIMPLE MASTECTOMY WITH AXILLARY  SENTINEL NODE BIOPSY Patient Active Problem List  Diagnosis  . Cancer of left breast  . Malignant neoplasm of upper-outer quadrant of female breast    Doing weill  Plan: Discharge Will have her back to office next week for drain removal   LOS: 1 day     Currie Paris, MD, [facility], [place] [phone]   01/26/2012 7:30 AM

## 2012-01-26 NOTE — Telephone Encounter (Signed)
Revealed negative BRCA and BART test results.

## 2012-01-27 ENCOUNTER — Encounter: Payer: Self-pay | Admitting: Genetic Counselor

## 2012-01-31 ENCOUNTER — Encounter: Payer: Self-pay | Admitting: *Deleted

## 2012-01-31 ENCOUNTER — Telehealth (INDEPENDENT_AMBULATORY_CARE_PROVIDER_SITE_OTHER): Payer: Self-pay | Admitting: General Surgery

## 2012-01-31 NOTE — Telephone Encounter (Signed)
Patient aware path results are good. Lymph nodes negative and margins ok. She will follow up in the office at her scheduled appt and call with any questions prior.  

## 2012-01-31 NOTE — Progress Notes (Signed)
Ordered Oncotype Dx test w/ [facility].  Faxed request to Path.  Faxed PAC to [employer]. 

## 2012-01-31 NOTE — Telephone Encounter (Signed)
Message copied by Liliana Cline on Tue Jan 31, 2012  2:32 PM ------      Message from: [place], [place]      Created: Mon Jan 30, 2012  2:11 PM      Regarding: Dr Jamey Ripa      Contact: [phone]       Pt want to know the results of her pathology report. Please call pt concerning this.            Thanks

## 2012-02-01 ENCOUNTER — Ambulatory Visit (INDEPENDENT_AMBULATORY_CARE_PROVIDER_SITE_OTHER): Payer: BC Managed Care – PPO | Admitting: Surgery

## 2012-02-01 ENCOUNTER — Encounter (INDEPENDENT_AMBULATORY_CARE_PROVIDER_SITE_OTHER): Payer: Self-pay | Admitting: Surgery

## 2012-02-01 VITALS — BP 130/82 | HR 64 | Temp 98.2°F | Resp 18 | Ht 69.0 in | Wt 233.8 lb

## 2012-02-01 DIAGNOSIS — Z9889 Other specified postprocedural states: Secondary | ICD-10-CM

## 2012-02-01 MED ORDER — OXYCODONE-ACETAMINOPHEN 5-325 MG PO TABS: 1.0000 | ORAL_TABLET | ORAL | Status: AC | PRN

## 2012-02-01 NOTE — Progress Notes (Signed)
Patient returns at the left mastectomy for a T1 N0 MX multifocal left breast cancer. She's having about 90 cc a day drainage from her drain. She's having some sweating she thinks it might which may be from her oxycodone. She otherwise is doing well.  Exam left mastectomy incision healing well. JP serosanguineous drainage  Impression: Status post left simple mastectomy with sentinel lymph node mapping  Plan: Doing well. Return next week for drain removal. Refill pain medicine.

## 2012-02-01 NOTE — Patient Instructions (Signed)
Return  Next week

## 2012-02-08 ENCOUNTER — Ambulatory Visit (INDEPENDENT_AMBULATORY_CARE_PROVIDER_SITE_OTHER): Payer: BC Managed Care – PPO | Admitting: Surgery

## 2012-02-08 ENCOUNTER — Encounter (INDEPENDENT_AMBULATORY_CARE_PROVIDER_SITE_OTHER): Payer: Self-pay | Admitting: Surgery

## 2012-02-08 VITALS — BP 124/80 | HR 80 | Temp 97.2°F | Resp 16 | Ht 69.5 in | Wt 235.8 lb

## 2012-02-08 DIAGNOSIS — Z09 Encounter for follow-up examination after completed treatment for conditions other than malignant neoplasm: Secondary | ICD-10-CM

## 2012-02-08 NOTE — Progress Notes (Signed)
IMO CUMBIE    [id number] 02/08/2012    1950-08-01   CC: Post op mastectoymy  HPI: The patient returns for post op follow-up. She underwent a left mastectomy and SLN on 8/21. Over all she feels that she is doing well. No particular problems noted  PE: VITAL SIGNS: BP 124/80  Pulse 80  Temp 97.2 F (36.2 C) (Temporal)  Resp 16  Ht 5' 9.5" (1.765 m)  Wt 235 lb 12.8 oz (106.958 kg)  BMI 34.32 kg/m2  The incision is healing nicely and there is no evidence of infection or hematoma.  The drains are still about 60cc in 24 hours.  DATA REVIEWED: Pathology report showed Stage I, node negative, two foci  IMPRESSION: Patient doing well. Drain not ready to remove  PLAN: Her next visit will be in one week. Discussed path report with her.Marland Kitchen

## 2012-02-08 NOTE — Patient Instructions (Signed)
See me again next week to see if we can get the drain out. It's now ok to shower

## 2012-02-09 ENCOUNTER — Ambulatory Visit: Payer: BC Managed Care – PPO | Admitting: Family

## 2012-02-13 ENCOUNTER — Telehealth (INDEPENDENT_AMBULATORY_CARE_PROVIDER_SITE_OTHER): Payer: Self-pay

## 2012-02-13 NOTE — Telephone Encounter (Signed)
Patient called to ask if she could come in today for drain pull instead of waiting until tomorrow. I asked Lesly Rubenstein if she could do a nurse only visit and she said she could. when I got on the phone to let her know I could make a nurse only appointment she decided to just wait until tomorrow when she is scheduled to see Streck.

## 2012-02-14 ENCOUNTER — Ambulatory Visit (INDEPENDENT_AMBULATORY_CARE_PROVIDER_SITE_OTHER): Payer: BC Managed Care – PPO | Admitting: Surgery

## 2012-02-14 ENCOUNTER — Encounter (INDEPENDENT_AMBULATORY_CARE_PROVIDER_SITE_OTHER): Payer: Self-pay | Admitting: Surgery

## 2012-02-14 ENCOUNTER — Encounter (INDEPENDENT_AMBULATORY_CARE_PROVIDER_SITE_OTHER): Payer: Self-pay | Admitting: General Surgery

## 2012-02-14 ENCOUNTER — Encounter: Payer: Self-pay | Admitting: *Deleted

## 2012-02-14 VITALS — BP 140/88 | HR 68 | Temp 97.9°F | Resp 14 | Ht 69.5 in | Wt 235.0 lb

## 2012-02-14 DIAGNOSIS — Z9889 Other specified postprocedural states: Secondary | ICD-10-CM

## 2012-02-14 NOTE — Progress Notes (Signed)
Catherine Atkins    [id number] 02/14/2012    1951-03-28   CC: Post op mastectoymy  HPI: The patient returns for post op follow-up. She underwent a left mastectomy and SLN on 8/21. Over all she feels that she is doing well. No particular problems noted  PE: VITAL SIGNS: BP 140/88  Pulse 68  Temp 97.9 F (36.6 C)  Resp 14  Ht 5' 9.5" (1.765 m)  Wt 235 lb (106.595 kg)  BMI 34.21 kg/m2  The incision is healing nicely and there is no evidence of infection or hematoma.  The drain has no drainage for three days.  DATA REVIEWED: Pathology report showed Stage I, node negative, two foci  IMPRESSION: Patient doing well. 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

## 2012-02-14 NOTE — Patient Instructions (Addendum)
See me again in two weeks but if you believe there is a fluuid build up, come back next week - call my nurse, Lesly Rubenstein, to have someone see you      Catherine Atkins  is OK to attend the ABC Class Currie Paris, MD, [facility], [place] [phone] 02/14/2012 2:34 PM

## 2012-02-14 NOTE — Progress Notes (Signed)
Received Oncotype Dx results of 27.  Gave copy to MD.  Rochele Pages copy to Med Rec to scan.

## 2012-02-16 ENCOUNTER — Ambulatory Visit
Admission: RE | Admit: 2012-02-16 | Discharge: 2012-02-16 | Disposition: A | Discharge status: Home / Self Care | Payer: BC Managed Care – PPO | Source: Ambulatory Visit | Attending: Oncology | Admitting: Oncology

## 2012-02-16 DIAGNOSIS — C50912 Malignant neoplasm of unspecified site of left female breast: Secondary | ICD-10-CM

## 2012-02-17 ENCOUNTER — Telehealth: Payer: Self-pay | Admitting: Oncology

## 2012-02-17 ENCOUNTER — Ambulatory Visit (HOSPITAL_BASED_OUTPATIENT_CLINIC_OR_DEPARTMENT_OTHER): Payer: BC Managed Care – PPO | Admitting: Oncology

## 2012-02-17 VITALS — BP 126/81 | HR 76 | Temp 98.5°F | Resp 20 | Ht 69.5 in | Wt 234.1 lb

## 2012-02-17 DIAGNOSIS — Z170 Estrogen receptor positive status [ER+]: Secondary | ICD-10-CM

## 2012-02-17 DIAGNOSIS — C50919 Malignant neoplasm of unspecified site of unspecified female breast: Secondary | ICD-10-CM

## 2012-02-17 DIAGNOSIS — C50912 Malignant neoplasm of unspecified site of left female breast: Secondary | ICD-10-CM

## 2012-02-17 MED ORDER — ANASTROZOLE 1 MG PO TABS: 1.0000 mg | ORAL_TABLET | Freq: Every day | ORAL | Status: AC

## 2012-02-17 NOTE — Telephone Encounter (Signed)
gv pt appt schedule for October.  °

## 2012-02-17 NOTE — Patient Instructions (Signed)
   18% chance of the cancer coming back with out chemotherapy;  With chemotherapy the chance is 9-10% Chemotherapy; drugs by vein; can cause hair loss, fatigue, some nausea

## 2012-02-19 NOTE — Progress Notes (Signed)
Hematology and Oncology Follow Up Visit  Catherine Atkins [id number] 07/14/1950 61 y.o. 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 She had her lumpectomy. 2 isolated foci were identified one measuring 1.2 cm one measuring 1.6 cm. Large grade 2 histology. Oncotype score and was submitted and came back with a score of 27 corresponding to a risk of recurrence of 18%.   Interim History:  As above  Medications: I have reviewed the patient's current medications.  Allergies:  Allergies  Allergen Reactions  . Codeine Itching    Past Medical History, Surgical history, Social history, and Family History were reviewed and updated.  Review of Systems: Constitutional:  Negative for fever, chills, night sweats, anorexia, weight loss, pain. Cardiovascular: no chest pain or dyspnea on exertion Respiratory: no cough, shortness of breath, or wheezing Neurological: no TIA or stroke symptoms Dermatological: negative ENT: negative Skin Gastrointestinal: negative Genito-Urinary: negative Hematological and Lymphatic: negative Breast: negative Musculoskeletal: negative Remaining ROS negative.  Physical Exam:  Blood pressure 126/81, pulse 76, temperature 98.5 F (36.9 C), temperature source Oral, resp. rate 20, height 5' 9.5" (1.765 m), weight 234 lb 1.6 oz (106.187 kg).  ECOG: 0   HEENT:  Sclerae anicteric, conjunctivae pink.  Oropharynx clear.  No mucositis or candidiasis.  Nodes:  No cervical, supraclavicular, or axillary lymphadenopathy palpated.  Breast Exam:  Right breast is benign.  No masses, discharge, skin change, or nipple inversion.  Left breast is benign.  No masses, discharge, skin change, or nipple inversion..  Lungs:  Clear to auscultation bilaterally.  No crackles, rhonchi, or wheezes.  Heart:  Regular rate and rhythm.  Abdomen:  Soft, nontender.  Positive bowel sounds.  No  organomegaly or masses palpated.  Musculoskeletal:  No focal spinal tenderness to palpation.  Extremities:  Benign.  No peripheral edema or cyanosis.  Skin:  Benign.  Neuro:  Nonfocal.    Lab Results: Lab Results  Component Value Date   WBC 8.4 01/23/2012   HGB 12.4 01/23/2012   HCT 36.4 01/23/2012   MCV 94.3 01/23/2012   PLT 295 01/23/2012     Chemistry      Component Value Date/Time   NA 139 01/23/2012 0915   K 4.6 01/23/2012 0915   CL 102 01/23/2012 0915   CO2 26 01/23/2012 0915   BUN 15 01/23/2012 0915   CREATININE 1.16* 01/23/2012 0915      Component Value Date/Time   CALCIUM 9.7 01/23/2012 0915   ALKPHOS 64 01/23/2012 0915   AST 21 01/23/2012 0915   ALT 19 01/23/2012 0915   BILITOT 0.2* 01/23/2012 0915       Radiological Studies:  Dg Chest 2 View  01/23/2012  *RADIOLOGY REPORT*  Clinical Data: Preoperative respiratory exam for mastectomy.  CHEST - 2 VIEW  Comparison: None.  Findings: Heart size is normal.  Mediastinal shadows are normal. The lungs are clear.  No effusions.  No bony abnormalities.  IMPRESSION: Normal chest   Original Report Authenticated By: Thomasenia Sales, M.D. ( 01/23/2012 09:49:54 )    Nm Sentinel Node Inj-no Rpt (breast)  01/25/2012  CLINICAL DATA: Left breast cancer   Sulfur colloid was injected intradermally by the nuclear medicine  technologist for breast cancer sentinel node localization.     Dg Bone Density  02/16/2012  *RADIOLOGY REPORT*  Clinical Data: Post menopausal osteoporosis screening.  DUAL X-RAY ABSORPTIOMETRY (DXA) FOR BONE MINERAL DENSITY  AP  LUMBAR SPINE L1/L2  Bone Mineral Density (BMD):            1.210 g/cm2 Young Adult T Score:                          2.1 Z Score:                                                3.7  LEFT FEMUR NECK  Bone Mineral Density (BMD):             1.0007 g/cm2 Young Adult T Score:                           1.4 Z Score:                                                 2.9  ASSESSMENT:  Patient's diagnostic category is  NORMAL by WHO Criteria.  FRACTURE RISK: NOT INCREASED  FRAX: Not calculated due to a T score at or above -1.0  Comparison: None  RECOMMENDATIONS:  Effective therapies are available in the form of bisphosphonates, selective estrogen receptor modulators, biologic agents, and hormone replacement therapy (for women).  All patients should ensure an adequate intake of dietary calcium (1200mg  daily) and vitamin D (800 IU daily) unless contraindicated.  All treatment decisions require clinical judgement and consideration of individual patient factors, including patient preferences, co-morbidities, previous drug use, risk factors not captured in the FRAX model (e.g., frailty, falls, vitamin D deficiency, increased bone turnover, interval significant decline in bone density) and possible under-or over-estimation of fracture risk by FRAX.  The National Osteoporosis Foundation recommends that FDA-approved medical therapies be considered in postmenopausal women and mean age 62 or older with a:        1)     Hip or vertebral (clinical or morphometric) fracture.           2)    T-score of -2.5 or lower at the spine or hip. 3)    Ten-year fracture probability by FRAX of 3% or greater for hip fracture or 20% or greater for major osteoporotic fracture. FOLLOW-UP:  People with diagnosed cases of osteoporosis or at high risk for fracture should have regular bone mineral density tests.  For patients eligible for Medicare, routine testing is allowed once every 2 years.  The testing frequency can be increased to one year for patients who have rapidly progressing disease, those who are receiving or discontinuing medical therapy to restore bone mass, or have additional risk factors.  World Science writer [facility] [place]) Criteria:  Normal: T scores from +1.0 to -1.0 Low Bone Mass (Osteopenia): T scores between -1.0 and -2.5 Osteoporosis: T scores -2.5 and below  Comparison to Reference Population:  T score is the key measure used in the diagnosis  of osteoporosis and relative risk determination for fracture.  It provides a value for bone mass relative to the mean bone mass of a young adult reference population expressed in terms of standard deviation (SD).  Z score is the age-matched score showing the patient's values compared to a population matched for age, sex, and race.  This  is also expressed in terms of standard deviation.  The patient may have values that compare favorably to the age-matched values and still be at increased risk for fracture.   Original Report Authenticated By: Elba Barman, M.D.      IMPRESSIONS AND PLAN: A 61 y.o. female with   We spent 30 minutes today discussing the pros and cons of chemotherapy in the setting of intermediate score. Patient itself is little unsure as to how she wants to proceed. I think her prognosis is good regardless. She will take some time to think about how she wants to proceed. I described to her the option of chemotherapy using 4 cycles of TC given every 3 weeks. I have reviewed some side effects as well.  I will plan to see her in followup or she will call me before we see her to further discuss this.  Spent more than half the time coordinating care, as well as discussion of BMI and its implications.      Catherine Atkins 9/15/20137:15 PM Cell [id number]

## 2012-02-20 ENCOUNTER — Telehealth: Payer: Self-pay | Admitting: *Deleted

## 2012-02-20 NOTE — Telephone Encounter (Signed)
Pt relayed she is not going to take chemotherapy.  She has decided to take Anastrozole.  Informed Dr. Donnie Coffin, made referral to Dr. Mitzi Hansen.

## 2012-02-21 ENCOUNTER — Encounter: Payer: Self-pay | Admitting: *Deleted

## 2012-02-22 ENCOUNTER — Telehealth: Payer: Self-pay | Admitting: *Deleted

## 2012-02-22 NOTE — Telephone Encounter (Signed)
Left patient voice message to inform to the pt of the new date and time on 05-15-2012 at 2:00pm

## 2012-02-27 ENCOUNTER — Encounter (INDEPENDENT_AMBULATORY_CARE_PROVIDER_SITE_OTHER): Payer: Self-pay | Admitting: Surgery

## 2012-02-27 ENCOUNTER — Encounter (INDEPENDENT_AMBULATORY_CARE_PROVIDER_SITE_OTHER): Payer: Self-pay | Admitting: General Surgery

## 2012-02-27 ENCOUNTER — Ambulatory Visit (INDEPENDENT_AMBULATORY_CARE_PROVIDER_SITE_OTHER): Payer: BC Managed Care – PPO | Admitting: Surgery

## 2012-02-27 VITALS — BP 122/88 | HR 80 | Temp 97.9°F | Resp 14 | Ht 70.5 in | Wt 237.2 lb

## 2012-02-27 DIAGNOSIS — Z9889 Other specified postprocedural states: Secondary | ICD-10-CM

## 2012-02-27 NOTE — Progress Notes (Signed)
Catherine Atkins    [id number] 02/27/2012    [birth year]   CC: Post op mastectoymy  HPI: The patient returns for post op follow-up. She underwent a left mastectomy and SLN on 8/21. Over all she feels that she is doing well. No particular problems noted  PE: VITAL SIGNS: BP 122/88  Pulse 80  Temp 97.9 F (36.6 C) (Temporal)  Resp 14  Ht 5' 10.5" (1.791 m)  Wt 237 lb 3.2 oz (107.593 kg)  BMI 33.55 kg/m2  The incision is healing nicely and there is no evidence of infection or hematoma.  There is a small seroma - 60 cc aspirated    IMPRESSION: Patient doing well.  PLAN: Recheck two weeks again

## 2012-02-27 NOTE — Patient Instructions (Signed)
See me again in two weeks 

## 2012-03-09 ENCOUNTER — Encounter (INDEPENDENT_AMBULATORY_CARE_PROVIDER_SITE_OTHER): Payer: Self-pay | Admitting: Surgery

## 2012-03-09 ENCOUNTER — Ambulatory Visit (INDEPENDENT_AMBULATORY_CARE_PROVIDER_SITE_OTHER): Payer: BC Managed Care – PPO | Admitting: Surgery

## 2012-03-09 VITALS — BP 124/82 | HR 79 | Temp 99.3°F | Resp 18 | Ht 69.0 in | Wt 241.2 lb

## 2012-03-09 DIAGNOSIS — Z09 Encounter for follow-up examination after completed treatment for conditions other than malignant neoplasm: Secondary | ICD-10-CM

## 2012-03-09 NOTE — Progress Notes (Signed)
Catherine Atkins    [id number] 03/09/2012    20-Mar-1951   CC: Post op mastectoymy  HPI: The patient returns for post op follow-up. She underwent a left mastectomy and SLN on 8/21. Over all she feels that she is doing well. No particular problems noted She is not going to do chemo  PE: VITAL SIGNS: BP 124/82  Pulse 79  Temp 99.3 F (37.4 C) (Oral)  Resp 18  Ht 5\' 9"  (1.753 m)  Wt 241 lb 3.2 oz (109.408 kg)  BMI 35.62 kg/m2  The incision is healing nicely and there is no evidence of infection or hematoma.  There is a small seroma -120 cc aspirated    IMPRESSION: Patient doing well.  PLAN: Recheck two weeks again

## 2012-03-09 NOTE — Patient Instructions (Signed)
Keep a small pillow over the mastectomy site and use the breast binder to keep in in place

## 2012-03-15 ENCOUNTER — Encounter: Payer: Self-pay | Admitting: Radiation Oncology

## 2012-03-15 NOTE — Progress Notes (Signed)
The patient has undergone a left-sided mastectomy. She was going to be set up to see me postoperatively, but she did not feel that this was necessary. I have reviewed her results from surgery and she does not require postmastectomy radiotherapy. Therefore no visits will be scheduled in clinic. I would be happy to see her at any point in the future.

## 2012-03-21 ENCOUNTER — Ambulatory Visit: Payer: BC Managed Care – PPO | Admitting: Oncology

## 2012-03-23 ENCOUNTER — Encounter (INDEPENDENT_AMBULATORY_CARE_PROVIDER_SITE_OTHER): Payer: Self-pay | Admitting: Surgery

## 2012-03-23 ENCOUNTER — Ambulatory Visit (INDEPENDENT_AMBULATORY_CARE_PROVIDER_SITE_OTHER): Payer: BC Managed Care – PPO | Admitting: Surgery

## 2012-03-23 VITALS — BP 122/84 | HR 92 | Temp 97.6°F | Resp 16 | Ht 69.5 in | Wt 244.0 lb

## 2012-03-23 DIAGNOSIS — C50919 Malignant neoplasm of unspecified site of unspecified female breast: Secondary | ICD-10-CM

## 2012-03-23 DIAGNOSIS — C50912 Malignant neoplasm of unspecified site of left female breast: Secondary | ICD-10-CM

## 2012-03-23 DIAGNOSIS — Z9889 Other specified postprocedural states: Secondary | ICD-10-CM

## 2012-03-23 NOTE — Patient Instructions (Signed)
See me again in 3 months. If you get any more fluid in the mastectomy area come see me sooner.  It's okay to have a flu shot.

## 2012-03-23 NOTE — Progress Notes (Signed)
Catherine Atkins    [id number] 03/23/2012    April 20, 1951   CC: Post op mastectoymy  HPI: The patient returns for post op follow-up. She underwent a left mastectomy and SLN on 8/21. Over all she feels that she is doing well. No particular problems noted She is not going to do chemo  PE: VITAL SIGNS: BP 122/84  Pulse 92  Temp 97.6 F (36.4 C) (Temporal)  Resp 16  Ht 5' 9.5" (1.765 m)  Wt 244 lb (110.678 kg)  BMI 35.52 kg/m2  The incision is healing nicely and there is no evidence of infection or hematoma.  There is a small seroma -40 cc aspirated    IMPRESSION: Patient doing well.  PLAN: Recheck three months, sooner if any issues with fluid

## 2012-04-05 ENCOUNTER — Telehealth (INDEPENDENT_AMBULATORY_CARE_PROVIDER_SITE_OTHER): Payer: Self-pay | Admitting: General Surgery

## 2012-04-05 NOTE — Telephone Encounter (Signed)
Pt called to request a prescription for her breast prosthesis.  Rx written and FAXed to [facility], attn:  Dorothyann Gibbs at [phone].

## 2012-04-25 ENCOUNTER — Telehealth: Payer: Self-pay | Admitting: *Deleted

## 2012-04-25 NOTE — Telephone Encounter (Signed)
patient confirmed over the phone the new date and time on 05-24-2012 at 9:30am

## 2012-05-15 ENCOUNTER — Ambulatory Visit: Payer: BC Managed Care – PPO | Admitting: Oncology

## 2012-05-24 ENCOUNTER — Telehealth: Payer: Self-pay | Admitting: *Deleted

## 2012-05-24 ENCOUNTER — Ambulatory Visit (HOSPITAL_BASED_OUTPATIENT_CLINIC_OR_DEPARTMENT_OTHER): Payer: BC Managed Care – PPO | Admitting: Oncology

## 2012-05-24 VITALS — BP 123/81 | HR 73 | Temp 98.3°F | Resp 20 | Ht 69.5 in | Wt 248.1 lb

## 2012-05-24 DIAGNOSIS — C50912 Malignant neoplasm of unspecified site of left female breast: Secondary | ICD-10-CM

## 2012-05-24 DIAGNOSIS — C50919 Malignant neoplasm of unspecified site of unspecified female breast: Secondary | ICD-10-CM

## 2012-05-24 DIAGNOSIS — Z170 Estrogen receptor positive status [ER+]: Secondary | ICD-10-CM

## 2012-05-24 NOTE — Progress Notes (Signed)
Hematology and Oncology Follow Up Visit  Catherine Atkins [id number] [birth year] 61 y.o. 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    PAST THERAPY:  Mastectomy 01/25/12..   Interim History: she has not taken her arimidex after reading the side effects.   Medications: I have reviewed the patient's current medications.  Allergies:  Allergies  Allergen Reactions  . Codeine Itching    Past Medical History, Surgical history, Social history, and Family History were reviewed and updated.  Review of Systems: Constitutional:  Negative for fever, chills, night sweats, anorexia, weight loss, pain. Cardiovascular: no chest pain or dyspnea on exertion Respiratory: no cough, shortness of breath, or wheezing Neurological: no TIA or stroke symptoms Dermatological: negative ENT: negative Skin Gastrointestinal: negative Genito-Urinary: negative Hematological and Lymphatic: negative Breast: negative Musculoskeletal: negative Remaining ROS negative.  Physical Exam:  Blood pressure 123/81, pulse 73, temperature 98.3 F (36.8 C), resp. rate 20, height 5' 9.5" (1.765 m), weight 248 lb 1.6 oz (112.537 kg).  ECOG: 0   HEENT:  Sclerae anicteric, conjunctivae pink.  Oropharynx clear.  No mucositis or candidiasis.  Nodes:  No cervical, supraclavicular, or axillary lymphadenopathy palpated.  Breast Exam:  Right breast is benign.  No masses, discharge, skin change, or nipple inversion .  Lungs:  Clear to auscultation bilaterally.  No crackles, rhonchi, or wheezes.  Heart:  Regular rate and rhythm.  Abdomen:  Soft, nontender.  Positive bowel sounds.  No organomegaly or masses palpated.  Musculoskeletal:  No focal spinal tenderness to palpation.  Extremities:  Benign.  No peripheral edema or cyanosis.  Skin:  Benign.  Neuro:  Nonfocal.    Lab Results: Lab Results  Component Value Date   WBC 8.4  01/23/2012   HGB 12.4 01/23/2012   HCT 36.4 01/23/2012   MCV 94.3 01/23/2012   PLT 295 01/23/2012     Chemistry      Component Value Date/Time   NA 139 01/23/2012 0915   K 4.6 01/23/2012 0915   CL 102 01/23/2012 0915   CO2 26 01/23/2012 0915   BUN 15 01/23/2012 0915   CREATININE 1.16* 01/23/2012 0915      Component Value Date/Time   CALCIUM 9.7 01/23/2012 0915   ALKPHOS 64 01/23/2012 0915   AST 21 01/23/2012 0915   ALT 19 01/23/2012 0915   BILITOT 0.2* 01/23/2012 0915       Radiological Studies:  Dg Chest 2 View  01/23/2012  *RADIOLOGY REPORT*  Clinical Data: Preoperative respiratory exam for mastectomy.  CHEST - 2 VIEW  Comparison: None.  Findings: Heart size is normal.  Mediastinal shadows are normal. The lungs are clear.  No effusions.  No bony abnormalities.  IMPRESSION: Normal chest   Original Report Authenticated By: Thomasenia Sales, M.D. ( 01/23/2012 09:49:54 )    Nm Sentinel Node Inj-no Rpt (breast)  01/25/2012  CLINICAL DATA: Left breast cancer   Sulfur colloid was injected intradermally by the nuclear medicine  technologist for breast cancer sentinel node localization.     Dg Bone Density  02/16/2012  *RADIOLOGY REPORT*  Clinical Data: Post menopausal osteoporosis screening.  DUAL X-RAY ABSORPTIOMETRY (DXA) FOR BONE MINERAL DENSITY  AP LUMBAR SPINE L1/L2  Bone Mineral Density (BMD):            1.210 g/cm2 Young Adult T Score:  2.1 Z Score:                                                3.7  LEFT FEMUR NECK  Bone Mineral Density (BMD):             1.0007 g/cm2 Young Adult T Score:                           1.4 Z Score:                                                 2.9  ASSESSMENT:  Patient's diagnostic category is NORMAL by WHO Criteria.  FRACTURE RISK: NOT INCREASED  FRAX: Not calculated due to a T score at or above -1.0  Comparison: None  RECOMMENDATIONS:  Effective therapies are available in the form of bisphosphonates, selective estrogen receptor modulators,  biologic agents, and hormone replacement therapy (for women).  All patients should ensure an adequate intake of dietary calcium (1200mg  daily) and vitamin D (800 IU daily) unless contraindicated.  All treatment decisions require clinical judgement and consideration of individual patient factors, including patient preferences, co-morbidities, previous drug use, risk factors not captured in the FRAX model (e.g., frailty, falls, vitamin D deficiency, increased bone turnover, interval significant decline in bone density) and possible under-or over-estimation of fracture risk by FRAX.  The National Osteoporosis Foundation recommends that FDA-approved medical therapies be considered in postmenopausal women and mean age 45 or older with a:        1)     Hip or vertebral (clinical or morphometric) fracture.           2)    T-score of -2.5 or lower at the spine or hip. 3)    Ten-year fracture probability by FRAX of 3% or greater for hip fracture or 20% or greater for major osteoporotic fracture. FOLLOW-UP:  People with diagnosed cases of osteoporosis or at high risk for fracture should have regular bone mineral density tests.  For patients eligible for Medicare, routine testing is allowed once every 2 years.  The testing frequency can be increased to one year for patients who have rapidly progressing disease, those who are receiving or discontinuing medical therapy to restore bone mass, or have additional risk factors.  World [employer] writer [facility]) Criteria:  Normal: T scores from +1.0 to -1.0 Low Bone Mass (Osteopenia): T scores between -1.0 and -2.5 Osteoporosis: T scores -2.5 and below  Comparison to Reference Population:  T score is the key measure used in the diagnosis of osteoporosis and relative risk determination for fracture.  It provides a value for bone mass relative to the mean bone mass of a young adult reference population expressed in terms of standard deviation (SD).  Z score is the age-matched score  showing the patient's values compared to a population matched for age, sex, and race.  This is also expressed in terms of standard deviation.  The patient may have values that compare favorably to the age-matched values and still be at increased risk for fracture.   Original Report Authenticated By: Elba Barman, M.D.      IMPRESSIONS AND PLAN: A 61 y.o.  female with   Hx of Node negative breast cancer with intermediate oncotype score. She is less inclined to take the arimidex, despite an extensive discussion regarding the principle of the oncotype score which assumes the use of arimidex in the calculation of recurrence. I pointed out that d/e are very unique to the patient and she should give it a try.  I also pointed out other options beyond arimidex, should she not tolerate it.  I will plan to see her in followup or she will call me before we see her to further discuss this.  Spent more than half the time coordinating care, as well as discussion of BMI and its implications.      Kendricks Reap 12/19/201310:02 AM Cell [phone]

## 2012-05-24 NOTE — Telephone Encounter (Signed)
Gave patient instructions on getting the 2014 appointment

## 2012-05-28 ENCOUNTER — Ambulatory Visit: Payer: Self-pay | Admitting: Oncology

## 2012-06-05 ENCOUNTER — Encounter (INDEPENDENT_AMBULATORY_CARE_PROVIDER_SITE_OTHER): Payer: Self-pay | Admitting: Surgery

## 2012-07-03 ENCOUNTER — Encounter (INDEPENDENT_AMBULATORY_CARE_PROVIDER_SITE_OTHER): Payer: Self-pay | Admitting: Surgery

## 2012-07-03 ENCOUNTER — Ambulatory Visit (INDEPENDENT_AMBULATORY_CARE_PROVIDER_SITE_OTHER): Payer: PRIVATE HEALTH INSURANCE | Admitting: Surgery

## 2012-07-03 VITALS — BP 122/84 | HR 74 | Temp 97.6°F | Resp 18 | Ht 70.0 in | Wt 247.2 lb

## 2012-07-03 DIAGNOSIS — Z853 Personal history of malignant neoplasm of breast: Secondary | ICD-10-CM

## 2012-07-03 DIAGNOSIS — C50912 Malignant neoplasm of unspecified site of left female breast: Secondary | ICD-10-CM

## 2012-07-03 DIAGNOSIS — C50919 Malignant neoplasm of unspecified site of unspecified female breast: Secondary | ICD-10-CM

## 2012-07-03 NOTE — Patient Instructions (Addendum)
I will see you again in three months. If the fluid collection gets larger or causes any symptoms come back to see me sooner.

## 2012-07-03 NOTE — Progress Notes (Signed)
NAME: Catherine Atkins       DOB: 01-23-1951           DATE: 07/03/2012       MRN: [id number]  CC:  Chief Complaint  Patient presents with  . Breast Cancer Long Term Follow Up    3 month f/u br ca    Catherine Atkins is a 62 y.o.Marland Kitchenfemale who presents for routine followup of her Stage I, receptor +, IDC, multifocal, left breast diagnosed in July, 2013 and treated with mastectomy with SLN, . She has no problems or concerns on either side.  PFSH: She has had no significant changes since the last visit here.  ROS: There have been no significant changes since the last visit here  EXAM:  VS: BP 122/84  Pulse 74  Temp 97.6 F (36.4 C) (Temporal)  Resp 18  Ht 5\' 10"  (1.778 m)  Wt 247 lb 3.2 oz (112.129 kg)  BMI 35.47 kg/m2  General: The patient is alert, oriented, generally healthy appearing, NAD. Mood and affect are normal.  Breasts:  Right in normal Left is post mastectomy, but she now has a large seroma, which mimics an implant. It is not tender, no evidence of infection  Lymphatics: She has no axillary or supraclavicular adenopathy on either side.  Extremities: Full ROM of the surgical side with no lymphedema noted.  Data Reviewed: Notes from Dr Donnie Coffin and Juliene Pina  Impression: Doing well, with no evidence of recurrent cancer or new cancer; large post mastectomy seroma  Plan: Will continue to follow up in three months

## 2012-08-31 ENCOUNTER — Telehealth: Payer: Self-pay | Admitting: *Deleted

## 2012-08-31 NOTE — Telephone Encounter (Signed)
Pt called about her appt and I confirmed 09/04/12 appt w/ pt.

## 2012-09-04 ENCOUNTER — Ambulatory Visit (HOSPITAL_BASED_OUTPATIENT_CLINIC_OR_DEPARTMENT_OTHER): Payer: PRIVATE HEALTH INSURANCE | Admitting: Nurse Practitioner

## 2012-09-04 ENCOUNTER — Other Ambulatory Visit: Payer: Self-pay | Admitting: Nurse Practitioner

## 2012-09-04 ENCOUNTER — Encounter: Payer: Self-pay | Admitting: Nurse Practitioner

## 2012-09-04 ENCOUNTER — Telehealth: Payer: Self-pay | Admitting: *Deleted

## 2012-09-04 VITALS — BP 112/72 | HR 91 | Temp 98.5°F | Resp 20 | Ht 70.0 in | Wt 246.8 lb

## 2012-09-04 DIAGNOSIS — Z9010 Acquired absence of unspecified breast and nipple: Secondary | ICD-10-CM

## 2012-09-04 DIAGNOSIS — Z170 Estrogen receptor positive status [ER+]: Secondary | ICD-10-CM

## 2012-09-04 DIAGNOSIS — C50912 Malignant neoplasm of unspecified site of left female breast: Secondary | ICD-10-CM

## 2012-09-04 DIAGNOSIS — C50919 Malignant neoplasm of unspecified site of unspecified female breast: Secondary | ICD-10-CM

## 2012-09-04 MED ORDER — TAMOXIFEN CITRATE 20 MG PO TABS: 20.0000 mg | ORAL_TABLET | Freq: Every day | ORAL | Status: DC

## 2012-09-04 NOTE — Progress Notes (Signed)
ID: [name]   DOB: 1950/07/09  MR#: [id number]  JXB#:[id number]  PCP: Carylon Perches, MD GYN:  SU:  OTHER MD:   HISTORY OF PRESENT ILLNESS:  62 yr old pt s/p mastectomy 01/25/2012 for invasive ductal ca - 1.6 cm greatest dimension, 6 negative nodes,  ER 100%, PR 8%, HER2 neu non amplified by CISH  INTERVAL HISTORY: Overall Ms. Centola is doing well. She remains resistant to taking an anti-estrogen treatment, largely because she believed they were unnecessary since she had a hysterectomy years ago. I reviewed data with her regarding adrenal glands and pituitary gland production of hormones, and the benefit of risk recurrence from either tamoxifen or aromatase inhibitors. She is open to discussion. She saw Dr. Jamey Ripa recently for routine follow up and is due to see him again at the end of this month, possibly to have the post-mastectomy seroma drained. She has no new complaints.   REVIEW OF SYSTEMS:  General: denies unexplained weight loss, fatigue, night sweats, fevers, chills --  admits to hot flashes HEENT: denies headaches, blurred vision, dizziness, loss of balance Cardiac: denies chest pain, pressure or palpitations Lungs: denies wheezing, shortness of breath or productive cough Abd: denies nausea, vomiting, constipation, diarrhea, indigestion, blood in stool or urine, dysphagia Extremities: denies numbness, tingling, swelling or pain - admits carpal tunnel syndrome induced numbness in right hand occasionally  The rest of the 14-point review of system was negative.    PAST MEDICAL HISTORY: Past Medical History  Diagnosis Date  . Depression   . Hypertension   . Cancer     lt breast  . Cancer of left breast 12/07/2011    Mammographically detectged, Dx by [facility] as IDC, Receptor+, Her 2 Negative  . Allergy     codeine  . Blood transfusion     years ago/    PAST SURGICAL HISTORY: Past Surgical History  Procedure Laterality Date  . Abdominal hysterectomy  1989    b/l  salpingoo=oppherecty  . Breast biopsy  12/08/11    Left breast 9 o'clock  bx=invasive mammary ca,ER/PR=+,IDC Her2 neg  . Colonoscopy    . Appendectomy    . Mastectomy  01/25/12    let mastectomy    PATHOLOGY: Mastectomy 01/25/2012 . Breast, simple mastectomy, Left - INVASIVE MULTIFOCAL DUCTAL CARCINOMA (TWO FOCI). - ONE FOCUS IS GRADE I DUCTAL CARCINOMA WHILE THE SECOND FOCUS IS GRADE II DUCTAL CARCINOMA. - THE FOCI SPAN 1.6 CM AND 1.2 CM IN GREATEST DIMENSION. - DEFINITIVE LYMPHOVASCULAR INVASION IS NOT IDENTIFIED. - MARGINS ARE NEGATIVE. - SIX BENIGN LYMPH NODES WITH NO TUMOR SEEN (0/6). Breast prognostic profile for mid lateral posterior mass ([id number]) Estrogen receptor: 100%, positive. Progesterone receptor: 8%, positive. Her 2 neu: 1.18, no amplification. Ki-67: 10%, low.   FAMILY HISTORY Family History  Problem Relation Age of Onset  . Cancer Mother     breast  . Cancer Sister     breast  . Leukemia Father   . Heart attack Brother   . Prostate cancer Maternal Uncle     GYNECOLOGIC HISTORY: menarche at 19, nulliparous, never pregnant, hysterectomy at age 3, was on HRT w/ premarin for ~ 15 years  SOCIAL HISTORY: married lives with husband and dog    ADVANCED DIRECTIVES:  HEALTH MAINTENANCE: History  Substance Use Topics  . Smoking status: Never Smoker   . Smokeless tobacco: Not on file  . Alcohol Use: No     Colonoscopy:  PAP:  Bone density:  Lipid panel:  Allergies  Allergen Reactions  . Codeine Itching    Current Outpatient Prescriptions  Medication Sig Dispense Refill  . acetaminophen (TYLENOL) 500 MG tablet Take 500 mg by mouth every 6 (six) hours as needed. Take 2 500mg  po prn headacjhes       . amitriptyline (ELAVIL) 25 MG tablet Take 25 mg by mouth at bedtime.      Marland Kitchen amLODipine-benazepril (LOTREL) 5-20 MG per capsule Take 1 capsule by mouth daily.      Marland Kitchen atenolol (TENORMIN) 100 MG tablet Take 100 mg by mouth daily.      Marland Kitchen buPROPion  (WELLBUTRIN XL) 300 MG 24 hr tablet Take 300 mg by mouth daily.      . cloNIDine (CATAPRES) 0.1 MG tablet Take 0.1 mg by mouth 2 (two) times daily.      Marland Kitchen triamterene-hydrochlorothiazide (MAXZIDE) 75-50 MG per tablet Take 1 tablet by mouth daily.       No current facility-administered medications for this visit.    OBJECTIVE: Filed Vitals:   09/04/12 0835  BP: 112/72  Pulse: 91  Temp: 98.5 F (36.9 C)  Resp: 20     Body mass index is 35.41 kg/(m^2).    ECOG :0 Sclerae unicteric Oropharynx clear No cervical or supraclavicular adenopathy Lungs no rales or rhonchi Heart regular rate and rhythm Abd benign no HSM or tenderness.  MSK no focal spinal tenderness, no peripheral edema Neuro: nonfocal Breasts: large seroma noted to left mastectomy site with well healed scar. Right breast pendulous and unremarkable. No suspicious nodules on either side. Patient examined both lying down in supine position and sitting up. Both axilla negative.    LAB RESULTS: Lab Results  Component Value Date   WBC 8.4 01/23/2012   NEUTROABS 4.4 01/23/2012   HGB 12.4 01/23/2012   HCT 36.4 01/23/2012   MCV 94.3 01/23/2012   PLT 295 01/23/2012      Chemistry      Component Value Date/Time   NA 139 01/23/2012 0915   K 4.6 01/23/2012 0915   CL 102 01/23/2012 0915   CO2 26 01/23/2012 0915   BUN 15 01/23/2012 0915   CREATININE 1.16* 01/23/2012 0915      Component Value Date/Time   CALCIUM 9.7 01/23/2012 0915   ALKPHOS 64 01/23/2012 0915   AST 21 01/23/2012 0915   ALT 19 01/23/2012 0915   BILITOT 0.2* 01/23/2012 0915       Lab Results  Component Value Date   LABCA2 25 01/10/2012    No components found with this basename: [id number]    No results found for this basename: INR,  in the last 168 hours  Urinalysis    Component Value Date/Time   COLORURINE YELLOW 01/23/2012 0956   APPEARANCEUR CLEAR 01/23/2012 0956   LABSPEC 1.008 01/23/2012 0956   PHURINE 7.0 01/23/2012 0956   GLUCOSEU NEGATIVE 01/23/2012 0956    HGBUR NEGATIVE 01/23/2012 0956   BILIRUBINUR NEGATIVE 01/23/2012 0956   KETONESUR NEGATIVE 01/23/2012 0956   PROTEINUR NEGATIVE 01/23/2012 0956   UROBILINOGEN 0.2 01/23/2012 0956   NITRITE NEGATIVE 01/23/2012 0956   LEUKOCYTESUR LARGE* 01/23/2012 0956    STUDIES: No results found.  ASSESSMENT/ PLAN: 62 y.o.  S/p mastectomy on 01/25/2012. She has previously refused treatment with aromatase inhibitors. Dr. Darnelle Catalan spent approximately 20 minutes with her today reviewing her recurrence risk with [employer] data. She has agreed to try Tamoxifen for 3 months and will stop it if she cannot tolerate it. All of her questions were answered to  her satisfaction.  She was also counseled regarding starting an exercise program, weight loss and healthy diet. We will see her back in 3 months. She knows to call if she has questions or concerns. Tamoxifen was prescribed.      Keneth Borg  AOCNP, NP-C   09/04/2012

## 2012-09-04 NOTE — Telephone Encounter (Signed)
appts made and printed 

## 2012-09-28 ENCOUNTER — Encounter (INDEPENDENT_AMBULATORY_CARE_PROVIDER_SITE_OTHER): Payer: Self-pay | Admitting: Surgery

## 2012-09-28 ENCOUNTER — Ambulatory Visit (INDEPENDENT_AMBULATORY_CARE_PROVIDER_SITE_OTHER): Payer: PRIVATE HEALTH INSURANCE | Admitting: Surgery

## 2012-09-28 VITALS — BP 110/82 | HR 88 | Resp 16 | Ht 69.5 in | Wt 249.0 lb

## 2012-09-28 DIAGNOSIS — Z853 Personal history of malignant neoplasm of breast: Secondary | ICD-10-CM

## 2012-09-28 NOTE — Progress Notes (Signed)
NAME: Catherine Atkins       DOB: 1951/04/26           DATE: 09/28/2012       MRN: [id number]  CC:  Chief Complaint  Patient presents with  . Breast Cancer Long Term Follow Up    3 month follow up left mastectomy 01/25/2012    Catherine Atkins is a 62 y.o.Marland Kitchenfemale who presents for routine followup of her Stage I, receptor +, IDC, multifocal, left breast diagnosed in July, 2013 and treated with mastectomy with SLN, . She has no problems or concerns on either side.Her seroma is stable and asymptomatic  PFSH: She has had no significant changes since the last visit here.  ROS: There have been no significant changes since the last visit here  EXAM:  VS: BP 110/82  Pulse 88  Resp 16  Ht 5' 9.5" (1.765 m)  Wt 249 lb (112.946 kg)  BMI 36.26 kg/m2  General: The patient is alert, oriented, generally healthy appearing, NAD. Mood and affect are normal.  Breasts:  Right is normal, Left is post mastectomy, but she has a large seroma, which mimics an implant. It is not tender, no evidence of infection, stable since last visit  Lymphatics: She has no axillary or supraclavicular adenopathy on either side.  Extremities: Full ROM of the surgical side with no lymphedema noted.  Data Reviewed: No new data  Impression: Doing well, with no evidence of recurrent cancer or new cancer; large post mastectomy seroma  Plan: Will continue to follow up in three months Discussed plastic surgery for reconstruction, but currently does not wish to pursue. Discussed seroma with her and discussed signs of infection and need to see [place] if any develop. Also discussed drainage, but might require surgery to eliminate

## 2012-09-28 NOTE — Patient Instructions (Signed)
Se me again in three months

## 2012-11-26 ENCOUNTER — Other Ambulatory Visit: Payer: Self-pay | Admitting: Physician Assistant

## 2012-11-26 DIAGNOSIS — C50912 Malignant neoplasm of unspecified site of left female breast: Secondary | ICD-10-CM

## 2012-11-27 ENCOUNTER — Other Ambulatory Visit (HOSPITAL_BASED_OUTPATIENT_CLINIC_OR_DEPARTMENT_OTHER): Payer: PRIVATE HEALTH INSURANCE | Admitting: Lab

## 2012-11-27 DIAGNOSIS — C50912 Malignant neoplasm of unspecified site of left female breast: Secondary | ICD-10-CM

## 2012-11-27 DIAGNOSIS — C50919 Malignant neoplasm of unspecified site of unspecified female breast: Secondary | ICD-10-CM

## 2012-11-27 LAB — COMPREHENSIVE METABOLIC PANEL (CC13)
Alkaline Phosphatase: 52 U/L (ref 40–150)
CO2: 24 mEq/L (ref 22–29)
Creatinine: 1.3 mg/dL — ABNORMAL HIGH (ref 0.6–1.1)
Glucose: 124 mg/dl — ABNORMAL HIGH (ref 70–99)
Sodium: 142 mEq/L (ref 136–145)
Total Bilirubin: 0.21 mg/dL (ref 0.20–1.20)
Total Protein: 6.9 g/dL (ref 6.4–8.3)

## 2012-11-27 LAB — CBC WITH DIFFERENTIAL/PLATELET
Eosinophils Absolute: 0.1 10*3/uL (ref 0.0–0.5)
HCT: 34.6 % — ABNORMAL LOW (ref 34.8–46.6)
LYMPH%: 39.4 % (ref 14.0–49.7)
MCHC: 34.7 g/dL (ref 31.5–36.0)
MCV: 91.6 fL (ref 79.5–101.0)
MONO%: 7.9 % (ref 0.0–14.0)
NEUT#: 3.5 10*3/uL (ref 1.5–6.5)
NEUT%: 50.5 % (ref 38.4–76.8)
Platelets: 241 10*3/uL (ref 145–400)
RBC: 3.78 10*6/uL (ref 3.70–5.45)

## 2012-12-04 ENCOUNTER — Ambulatory Visit (HOSPITAL_BASED_OUTPATIENT_CLINIC_OR_DEPARTMENT_OTHER): Payer: PRIVATE HEALTH INSURANCE | Admitting: Oncology

## 2012-12-04 ENCOUNTER — Telehealth: Payer: Self-pay | Admitting: Oncology

## 2012-12-04 ENCOUNTER — Other Ambulatory Visit: Payer: PRIVATE HEALTH INSURANCE | Admitting: Lab

## 2012-12-04 VITALS — BP 105/68 | HR 68 | Temp 98.3°F | Resp 20 | Ht 69.5 in | Wt 247.7 lb

## 2012-12-04 DIAGNOSIS — C50412 Malignant neoplasm of upper-outer quadrant of left female breast: Secondary | ICD-10-CM

## 2012-12-04 DIAGNOSIS — C50419 Malignant neoplasm of upper-outer quadrant of unspecified female breast: Secondary | ICD-10-CM

## 2012-12-04 NOTE — Progress Notes (Signed)
ID: Catherine Atkins   DOB: [birth year]  MR#: [id number]  WUJ#:[id number]  PCP: Carylon Perches, MD GYN:  SU:  OTHER MD:   HISTORY OF PRESENT ILLNESS:  62 yr old pt s/p mastectomy 01/25/2012 for invasive ductal ca - 1.6 cm greatest dimension, 6 negative nodes,  ER 100%, PR 8%, HER2 neu non amplified by CISH  INTERVAL HISTORY: Peruski returns today for followup of her breast cancer. The interval history is unremarkable. She started tamoxifen at the last visit here and the only change she has noted is slightly more, and hot flashes. They do wake her up at night sometimes.  REVIEW OF SYSTEMS: A detailed review of systems today was otherwise significant only for her shortness of breath when walking up stairs, mild stress urinary incontinence, occasional mild headaches, and mild depressive symptoms.  PAST MEDICAL HISTORY: Past Medical History  Diagnosis Date  . Depression   . Hypertension   . Cancer     lt breast  . Cancer of left breast 12/07/2011    Mammographically detectged, Dx by [facility] as IDC, Receptor+, Her 2 Negative  . Allergy     codeine  . Blood transfusion     years ago/    PAST SURGICAL HISTORY: Past Surgical History  Procedure Laterality Date  . Abdominal hysterectomy  1989    b/l salpingoo=oppherecty  . Breast biopsy  12/08/11    Left breast 9 o'clock  bx=invasive mammary ca,ER/PR=+,IDC Her2 neg  . Colonoscopy    . Appendectomy    . Mastectomy  01/25/12    let mastectomy    FAMILY HISTORY Family History  Problem Relation Age of Onset  . Cancer Mother     breast  . Cancer Sister     breast  . Leukemia Father   . Heart attack Brother   . Prostate cancer Maternal Uncle     GYNECOLOGIC HISTORY: menarche at 62, GX P0,  hysterectomy at age 62, was on HRT w/ premarin for ~ 15 years  SOCIAL HISTORY: She is a Scientist, forensic for replacements, working mostly weekends. Her husband Dorene Sorrow is a retired radio an Designer, industrial/product. He has 3 children from a prior marriage, non-in  this area. The patient is not a church attender.    ADVANCED DIRECTIVES: in place  HEALTH MAINTENANCE: History  Substance Use Topics  . Smoking status: Never Smoker   . Smokeless tobacco: Not on file  . Alcohol Use: No     Colonoscopy:  PAP:  Bone density:  Lipid panel:  Allergies  Allergen Reactions  . Codeine Itching    Current Outpatient Prescriptions  Medication Sig Dispense Refill  . acetaminophen (TYLENOL) 500 MG tablet Take 500 mg by mouth every 6 (six) hours as needed. Take 2 500mg  po prn headacjhes       . amitriptyline (ELAVIL) 25 MG tablet Take 25 mg by mouth at bedtime.      Marland Kitchen amLODipine-benazepril (LOTREL) 5-20 MG per capsule Take 1 capsule by mouth daily.      Marland Kitchen atenolol (TENORMIN) 100 MG tablet Take 100 mg by mouth daily.      Marland Kitchen buPROPion (WELLBUTRIN XL) 300 MG 24 hr tablet Take 300 mg by mouth daily.      . cloNIDine (CATAPRES) 0.1 MG tablet Take 0.1 mg by mouth 2 (two) times daily.      . tamoxifen (NOLVADEX) 20 MG tablet Take 1 tablet (20 mg total) by mouth daily.  30 tablet  11  . triamterene-hydrochlorothiazide (MAXZIDE) 75-50  MG per tablet Take 1 tablet by mouth daily.       No current facility-administered medications for this visit.    OBJECTIVE: Middle-aged white woman in no acute distress Filed Vitals:   12/04/12 1012  BP: 105/68  Pulse: 68  Temp: 98.3 F (36.8 C)  Resp: 20     Body mass index is 36.07 kg/(m^2).    ECOG :0 Sclerae unicteric Oropharynx clear No cervical or supraclavicular adenopathy Lungs no rales or rhonchi Heart regular rate and rhythm Abd benign no HSM or tenderness.  MSK no focal spinal tenderness, no peripheral edema Neuro: nonfocal, well oriented, positive affect Breasts: The right breast is unremarkable. The left breast is status post mastectomy. There is a significant seroma, but no erythema or dehiscence. The left axilla is benign  LAB RESULTS: Lab Results  Component Value Date   WBC 7.0 11/27/2012   NEUTROABS  3.5 11/27/2012   HGB 12.0 11/27/2012   HCT 34.6* 11/27/2012   MCV 91.6 11/27/2012   PLT 241 11/27/2012      Chemistry      Component Value Date/Time   NA 142 11/27/2012 0830   NA 139 01/23/2012 0915   K 3.7 11/27/2012 0830   K 4.6 01/23/2012 0915   CL 107 11/27/2012 0830   CL 102 01/23/2012 0915   CO2 24 11/27/2012 0830   CO2 26 01/23/2012 0915   BUN 16.1 11/27/2012 0830   BUN 15 01/23/2012 0915   CREATININE 1.3* 11/27/2012 0830   CREATININE 1.16* 01/23/2012 0915      Component Value Date/Time   CALCIUM 9.2 11/27/2012 0830   CALCIUM 9.7 01/23/2012 0915   ALKPHOS 52 11/27/2012 0830   ALKPHOS 64 01/23/2012 0915   AST 25 11/27/2012 0830   AST 21 01/23/2012 0915   ALT 22 11/27/2012 0830   ALT 19 01/23/2012 0915   BILITOT 0.21 11/27/2012 0830   BILITOT 0.2* 01/23/2012 0915       Lab Results  Component Value Date   LABCA2 25 01/10/2012    No components found with this basename: [id number]    No results found for this basename: INR,  in the last 168 hours  Urinalysis    Component Value Date/Time   COLORURINE YELLOW 01/23/2012 0956   APPEARANCEUR CLEAR 01/23/2012 0956   LABSPEC 1.008 01/23/2012 0956   PHURINE 7.0 01/23/2012 0956   GLUCOSEU NEGATIVE 01/23/2012 0956   HGBUR NEGATIVE 01/23/2012 0956   BILIRUBINUR NEGATIVE 01/23/2012 0956   KETONESUR NEGATIVE 01/23/2012 0956   PROTEINUR NEGATIVE 01/23/2012 0956   UROBILINOGEN 0.2 01/23/2012 0956   NITRITE NEGATIVE 01/23/2012 0956   LEUKOCYTESUR LARGE* 01/23/2012 0956    STUDIES: No results found.   ASSESSMENT: 62 y.o. [place] woman  (1) S/p mastectomy on 01/25/2012 for mpT1c, stage N0, stage IA invasive ductal carcinoma, grade 2, estrogen receptor 100% positive, progesterone receptor 80% positive, with no HER-2 amplification, and an MIB-1 of 10%..   (2) Oncotype score of 27 predicts a rate of distant recurrence within 10 years of 18% if the patient takes tamoxifen for 5 years  (3) tamoxifen started April 2014  (4) s/p remote  hysterectomy  PLAN: Dumas is tolerating the tamoxifen well. We're going to continue the tamoxifen at least 5 years and consider continuing to 10 at depending on her tolerance.  We discussed the possibility of reconstruction. At this point she wants to continue to follow with Dr. Jamey Ripa without any plastics intervention.  She is scheduled for mammography later this  month, and she will see Dr. Ouida Sills shortly afterwards to discuss results. She will see [place] again in February, and then in August of next year. At that point we will likely start seeing her on a once a year basis.  Lowella Dell  MD   12/04/2012

## 2013-01-03 ENCOUNTER — Other Ambulatory Visit: Payer: Self-pay | Admitting: Internal Medicine

## 2013-01-03 DIAGNOSIS — Z853 Personal history of malignant neoplasm of breast: Secondary | ICD-10-CM

## 2013-01-23 ENCOUNTER — Ambulatory Visit
Admission: RE | Admit: 2013-01-23 | Discharge: 2013-01-23 | Disposition: A | Discharge status: Home / Self Care | Payer: PRIVATE HEALTH INSURANCE | Source: Ambulatory Visit | Attending: Internal Medicine | Admitting: Internal Medicine

## 2013-01-23 DIAGNOSIS — Z853 Personal history of malignant neoplasm of breast: Secondary | ICD-10-CM

## 2013-03-06 ENCOUNTER — Encounter (INDEPENDENT_AMBULATORY_CARE_PROVIDER_SITE_OTHER): Payer: Self-pay | Admitting: Surgery

## 2013-03-06 ENCOUNTER — Ambulatory Visit (INDEPENDENT_AMBULATORY_CARE_PROVIDER_SITE_OTHER): Payer: PRIVATE HEALTH INSURANCE | Admitting: Surgery

## 2013-03-06 VITALS — BP 124/70 | HR 68 | Resp 18 | Ht 70.0 in | Wt 241.0 lb

## 2013-03-06 DIAGNOSIS — Z853 Personal history of malignant neoplasm of breast: Secondary | ICD-10-CM

## 2013-03-06 NOTE — Progress Notes (Signed)
NAME: Catherine Atkins       DOB: 04-04-1951           DATE: 03/06/2013       MRN: [id number]  CC:  Chief Complaint  Patient presents with  . Other    LTF breast exam    [name] is a 62 y.o.Marland Kitchenfemale who presents for routine followup of her Stage I, receptor +, IDC, multifocal, left breast diagnosed in July, 2013 and treated with mastectomy with SLN, . She has no problems or concerns on either side.Her seroma is stable and asymptomatic  PFSH: She has had no significant changes since the last visit here.  ROS: There have been no significant changes since the last visit here  EXAM:  VS: BP 124/70  Pulse 68  Resp 18  Ht 5\' 10"  (1.778 m)  Wt 241 lb (109.317 kg)  BMI 34.58 kg/m2  General: The patient is alert, oriented, generally healthy appearing, NAD. Mood and affect are normal.  Breasts:  Right is normal, Left is post mastectomy, but she has a large seroma, which mimics an implant. It is not tender, no evidence of infection, stable since last visit. She doesn't want anything done with it.  Lymphatics: She has no axillary or supraclavicular adenopathy on either side.  Extremities: Full ROM of the surgical side with no lymphedema noted.  Data Reviewed: Mammogram in August: *RADIOLOGY REPORT*  Clinical Data: Status post left mastectomy for breast cancer 1  year ago with subsequent drainage of recurrent fluid collections.  DIGITAL DIAGNOSTIC BILATERAL MAMMOGRAM WITH CAD  Comparison: Previous examinations.  Findings:  ACR Breast Density Category b: There are scattered areas of  fibroglandular density.  Stable mammographic appearance of the right breast with no findings  suspicious for malignancy. Interval post mastectomy changes on the  left with a large, oval fluid collection measuring 14.9 x 13.4 x  8.6 cm in maximum dimensions.  Mammographic images were processed with CAD.  IMPRESSION:  1. No evidence of malignancy.  2. Large seroma in the left mastectomy bed.   RECOMMENDATION:  Right screening mammogram in 1 year.  I have discussed the findings and recommendations with the patient.  Results were also provided in writing at the conclusion of the  visit. If applicable, a reminder letter will be sent to the  patient regarding her next appointment.  BI-RADS CATEGORY 2: Benign finding(s).  Original Report Authenticated By: Beckie Salts, M.D.   Impression: Doing well, with no evidence of recurrent cancer or new cancer; large post mastectomy seroma  Plan: Will continue to follow up in 6 months Discussed plastic surgery for reconstruction, but currently does not wish to pursue. Discussed seroma with her and discussed signs of infection and need to see [place] if any develop. Also discussed drainage, but might require surgery to eliminate

## 2013-03-06 NOTE — Patient Instructions (Signed)
Continue annual mammograms and see one of [place] in six months

## 2013-04-18 ENCOUNTER — Encounter: Payer: Self-pay | Admitting: *Deleted

## 2013-04-18 NOTE — Progress Notes (Signed)
This encounter was created in error - please disregard.

## 2013-07-08 ENCOUNTER — Other Ambulatory Visit: Payer: Self-pay | Admitting: *Deleted

## 2013-07-08 DIAGNOSIS — C50912 Malignant neoplasm of unspecified site of left female breast: Secondary | ICD-10-CM

## 2013-07-09 ENCOUNTER — Other Ambulatory Visit (HOSPITAL_BASED_OUTPATIENT_CLINIC_OR_DEPARTMENT_OTHER): Payer: PRIVATE HEALTH INSURANCE

## 2013-07-09 ENCOUNTER — Encounter: Payer: Self-pay | Admitting: Physician Assistant

## 2013-07-09 ENCOUNTER — Telehealth: Payer: Self-pay | Admitting: Oncology

## 2013-07-09 ENCOUNTER — Ambulatory Visit (HOSPITAL_BASED_OUTPATIENT_CLINIC_OR_DEPARTMENT_OTHER): Payer: PRIVATE HEALTH INSURANCE | Admitting: Physician Assistant

## 2013-07-09 VITALS — BP 91/56 | HR 80 | Temp 98.4°F | Resp 18 | Ht 70.0 in | Wt 239.6 lb

## 2013-07-09 DIAGNOSIS — C50419 Malignant neoplasm of upper-outer quadrant of unspecified female breast: Secondary | ICD-10-CM

## 2013-07-09 DIAGNOSIS — C50912 Malignant neoplasm of unspecified site of left female breast: Secondary | ICD-10-CM

## 2013-07-09 DIAGNOSIS — N959 Unspecified menopausal and perimenopausal disorder: Secondary | ICD-10-CM

## 2013-07-09 DIAGNOSIS — Z853 Personal history of malignant neoplasm of breast: Secondary | ICD-10-CM

## 2013-07-09 DIAGNOSIS — IMO0002 Reserved for concepts with insufficient information to code with codable children: Secondary | ICD-10-CM

## 2013-07-09 DIAGNOSIS — Z170 Estrogen receptor positive status [ER+]: Secondary | ICD-10-CM

## 2013-07-09 DIAGNOSIS — C50919 Malignant neoplasm of unspecified site of unspecified female breast: Secondary | ICD-10-CM

## 2013-07-09 DIAGNOSIS — R232 Flushing: Secondary | ICD-10-CM | POA: Insufficient documentation

## 2013-07-09 DIAGNOSIS — Z780 Asymptomatic menopausal state: Secondary | ICD-10-CM

## 2013-07-09 DIAGNOSIS — T451X5A Adverse effect of antineoplastic and immunosuppressive drugs, initial encounter: Secondary | ICD-10-CM

## 2013-07-09 LAB — COMPREHENSIVE METABOLIC PANEL (CC13)
ALBUMIN: 3.3 g/dL — AB (ref 3.5–5.0)
ALT: 19 U/L (ref 0–55)
AST: 26 U/L (ref 5–34)
Alkaline Phosphatase: 45 U/L (ref 40–150)
Anion Gap: 9 mEq/L (ref 3–11)
BUN: 14.8 mg/dL (ref 7.0–26.0)
CALCIUM: 9.2 mg/dL (ref 8.4–10.4)
CHLORIDE: 106 meq/L (ref 98–109)
CO2: 26 mEq/L (ref 22–29)
CREATININE: 1.5 mg/dL — AB (ref 0.6–1.1)
Glucose: 147 mg/dl — ABNORMAL HIGH (ref 70–140)
POTASSIUM: 3.4 meq/L — AB (ref 3.5–5.1)
Sodium: 141 mEq/L (ref 136–145)
Total Bilirubin: 0.34 mg/dL (ref 0.20–1.20)
Total Protein: 6.5 g/dL (ref 6.4–8.3)

## 2013-07-09 LAB — CBC WITH DIFFERENTIAL/PLATELET
BASO%: 1.5 % (ref 0.0–2.0)
Basophils Absolute: 0.1 10*3/uL (ref 0.0–0.1)
EOS%: 2.1 % (ref 0.0–7.0)
Eosinophils Absolute: 0.1 10*3/uL (ref 0.0–0.5)
HCT: 34.8 % (ref 34.8–46.6)
HGB: 11.9 g/dL (ref 11.6–15.9)
LYMPH#: 2.4 10*3/uL (ref 0.9–3.3)
LYMPH%: 37.6 % (ref 14.0–49.7)
MCH: 32.1 pg (ref 25.1–34.0)
MCHC: 34.3 g/dL (ref 31.5–36.0)
MCV: 93.4 fL (ref 79.5–101.0)
MONO#: 0.5 10*3/uL (ref 0.1–0.9)
MONO%: 7.1 % (ref 0.0–14.0)
NEUT#: 3.3 10*3/uL (ref 1.5–6.5)
NEUT%: 51.7 % (ref 38.4–76.8)
Platelets: 240 10*3/uL (ref 145–400)
RBC: 3.72 10*6/uL (ref 3.70–5.45)
RDW: 13 % (ref 11.2–14.5)
WBC: 6.5 10*3/uL (ref 3.9–10.3)

## 2013-07-09 MED ORDER — TAMOXIFEN CITRATE 20 MG PO TABS: 20.0000 mg | ORAL_TABLET | Freq: Every day | ORAL | Status: DC

## 2013-07-09 NOTE — Progress Notes (Signed)
[place]  Telephone:(336) [phone] Fax:(336) [phone]     ID: [name] OB: 16-Mar-1951  MR#: [id number]  JYN#:[id number]  PCP: Asencion Noble, MD GYN:   SU:  Neldon Mc, MD OTHER MD: Kyung Rudd, MD  CHIEF COMPLAINT: Hx of Left Breast Cancer   HISTORY OF PRESENT ILLNESS: Kaniah Rizzolo was diagnosed with a left breast cancer in 2013, and underwent a left mastectomy on 01/25/2012 for a pT1c, N0, stage IA invasive ductal carcinoma, grade 2. Tumor was ER positive at 100%, PR +80%, HER-2/neu negative, with an MIB-1 of 10%.  Oncotype score was 27, predicting a rate of distant recurrence within 10 years of 18% the patient took tamoxifen for 5 years. Tamoxifen was started in April of 2014.  Subsequent history is as detailed below.   INTERVAL HISTORY: Rajanae Mantia returns today for routine six-month followup of her left breast carcinoma. Overall she is doing well. She is tolerating the tamoxifen well with the exception of a slight increase in hot flashes. She was already having hot flashes, and knows that these have increased, but are not particularly problematic to her.    She's had no vaginal dryness, discharge, or bleeding. In fact she denies any abnormal bleeding and has had no abnormal blood clots.   REVIEW OF SYSTEMS: Annalisse Minkoff has had no recent illnesses and denies any fevers or chills. She denies any excessive fatigue, and her energy level is good. She has some shortness of breath with exertion which she attributes to deconditioning. She admits that she does not exercise regularly. She has no significant cough and denies any orthopnea, peripheral swelling, chest pain, or palpitations. She's eating and drinking well with no nausea or emesis and has had no change in bowel or bladder habits. She has occasional headaches for which she takes amitriptyline affectively. She denies any dizziness or recent change in vision. She does have some mild depression which she feels is  well-controlled, but she denies any suicidal ideation.  She denies any current pain, specifically no unusual myalgias, arthralgias, or bony pain.  A detailed review of systems is otherwise stable and noncontributory.  [name]  PAST MEDICAL HISTORY: Past Medical History  Diagnosis Date  . Depression   . Hypertension   . Cancer     lt breast  . Cancer of left breast 12/07/2011    Mammographically detectged, Dx by [facility] as IDC, Receptor+, Her 2 Negative  . Allergy     codeine  . Blood transfusion     years ago/    PAST SURGICAL HISTORY: Past Surgical History  Procedure Laterality Date  . Abdominal hysterectomy  1989    b/l salpingoo=oppherecty  . Breast biopsy  12/08/11    Left breast 9 o'clock  bx=invasive mammary ca,ER/PR=+,IDC Her2 neg  . Colonoscopy    . Appendectomy    . Mastectomy  01/25/12    let mastectomy    FAMILY HISTORY Family History  Problem Relation Age of Onset  . Cancer Mother     breast  . Cancer Sister     breast  . Leukemia Father   . Heart attack Brother   . Prostate cancer Maternal Uncle     GYNECOLOGIC HISTORY:  menarche at 46, GX P0, hysterectomy/BSO at age 30, was on HRT w/ premarin for ~ 15 years.  SOCIAL HISTORY: (Updated 07/09/2013) She is a Freight forwarder for replacements, working mostly weekends. Her husband Sonia Side is a retired radio an Optician, dispensing. He has 3 children  from a prior marriage, none in this area. The patient is not a church attender.    ADVANCED DIRECTIVES: In place   HEALTH MAINTENANCE: (updated 07/09/2013)  History  Substance Use Topics  . Smoking status: Never Smoker   . Smokeless tobacco: Never Used  . Alcohol Use: No     Colonoscopy: Due in 2015 (GI is in [place])  PAP: Status post hysterectomy; has pelvic exams   with Dr. Willey Blade,  [facility]  Bone density: September 2013, normal    Lipid panel: UTD, Dr. Willey Blade   Allergies  Allergen Reactions  . Codeine Itching    Current Outpatient Prescriptions    Medication Sig Dispense Refill  . amitriptyline (ELAVIL) 25 MG tablet Take 25 mg by mouth at bedtime.      Marland Kitchen amLODipine-benazepril (LOTREL) 5-20 MG per capsule Take 1 capsule by mouth daily.      Marland Kitchen atenolol (TENORMIN) 100 MG tablet Take 100 mg by mouth daily.      Marland Kitchen buPROPion (WELLBUTRIN XL) 300 MG 24 hr tablet Take 300 mg by mouth daily.      . cloNIDine (CATAPRES) 0.1 MG tablet Take 0.1 mg by mouth 2 (two) times daily.      . tamoxifen (NOLVADEX) 20 MG tablet Take 1 tablet (20 mg total) by mouth daily.  30 tablet  11  . triamterene-hydrochlorothiazide (MAXZIDE) 75-50 MG per tablet Take 1 tablet by mouth daily.      Marland Kitchen acetaminophen (TYLENOL) 500 MG tablet Take 500 mg by mouth every 6 (six) hours as needed. Take 2 542m po prn headacjhes        No current facility-administered medications for this visit.    OBJECTIVE:  Middle-aged white female who appears comfortable and is in no acute distress. Filed Vitals:   07/09/13 1045  BP: 91/56  Pulse: 80  Temp: 98.4 F (36.9 C)  Resp: 18     Body mass index is 34.38 kg/(m^2).    ECOG FS: 0 Filed Weights   07/09/13 1045  Weight: 239 lb 9.6 oz (108.682 kg)   Physical Exam: HEENT:  Sclerae anicteric.  Oropharynx clear, pink, and moist. Neck supple, trachea midline.  NODES:  No cervical or supraclavicular lymphadenopathy palpated.  BREAST EXAM:  Right breast is unremarkable. Left breast is status post mastectomy. On exam, it appears as if the patient has an implant, but this is actually a documented large seroma. There is no pain or tenderness to palpation. No erythema, skin changes, or evidence of infection. No suspicious nodularities, no evidence of local recurrence. Axillae are benign bilaterally, with no palpable lymphadenopathy. LUNGS:  Clear to auscultation bilaterally.  No wheezes or rhonchi HEART:  Regular rate and rhythm. No murmur. ABDOMEN:  Soft, obese, nontender.  Positive bowel sounds.  MSK:  No focal spinal tenderness to  palpation. Full range of motion bilaterally in the upper extremities. EXTREMITIES:  No peripheral edema.   SKIN:  Benign with no visible rashes or skin lesions. No excessive ecchymoses. No petechiae. No pallor. NEURO:  Nonfocal. Well oriented.  Appropriate affect.    LAB RESULTS:   Lab Results  Component Value Date   WBC 6.5 07/09/2013   NEUTROABS 3.3 07/09/2013   HGB 11.9 07/09/2013   HCT 34.8 07/09/2013   MCV 93.4 07/09/2013   PLT 240 07/09/2013      Chemistry      Component Value Date/Time   NA 141 07/09/2013 1014   NA 139 01/23/2012 0915   K 3.4* 07/09/2013 1014  K 4.6 01/23/2012 0915   CL 107 11/27/2012 0830   CL 102 01/23/2012 0915   CO2 26 07/09/2013 1014   CO2 26 01/23/2012 0915   BUN 14.8 07/09/2013 1014   BUN 15 01/23/2012 0915   CREATININE 1.5* 07/09/2013 1014   CREATININE 1.16* 01/23/2012 0915      Component Value Date/Time   CALCIUM 9.2 07/09/2013 1014   CALCIUM 9.7 01/23/2012 0915   ALKPHOS 45 07/09/2013 1014   ALKPHOS 64 01/23/2012 0915   AST 26 07/09/2013 1014   AST 21 01/23/2012 0915   ALT 19 07/09/2013 1014   ALT 19 01/23/2012 0915   BILITOT 0.34 07/09/2013 1014   BILITOT 0.2* 01/23/2012 0915       STUDIES:  Most recent mammogram was a 2014 and was unremarkable with the exception of a large left seroma which was stable.  Most recent bone density was 9/12 13 and was normal.   ASSESSMENT: 63 y.o. [place] woman   (1) S/p mastectomy on 01/25/2012 for mpT1c, stage N0, stage IA invasive ductal carcinoma, grade 2, estrogen receptor 100% positive, progesterone receptor 80% positive, with no HER-2 amplification, and an MIB-1 of 10%.  (2) Oncotype score of 27 predicts a rate of distant recurrence within 10 years of 18% if the patient takes tamoxifen for 5 years   (3) tamoxifen started April 2014   (4) s/p remote hysterectomy/BSO    PLAN: With regards to her breast cancer, Seymone Forlenza seems to be doing well. We will get her established with another surgeon at  [place] since Dr.  Margot Chimes has retired. She'll return there for routine followup in April, especially for followup of the large left seroma. She will then be due for her mammogram and bone density in late August, and we'll see [place] in thereafter for repeat labs and physical exam. If she is still doing well at that point, we will likely begin seeing her on an annual basis.  Our plan is to continue tamoxifen for a minimum of 5 years, possibly continuing as long as 10 years depending on her tolerance. All this was reviewed in detail with [facility] and today. She voices understanding and agreement with this plan, and will call with any changes or problems.   Savien Mamula, PA-C   07/09/2013 4:53 PM

## 2013-08-07 ENCOUNTER — Encounter (INDEPENDENT_AMBULATORY_CARE_PROVIDER_SITE_OTHER): Payer: Self-pay | Admitting: General Surgery

## 2013-08-07 ENCOUNTER — Ambulatory Visit (INDEPENDENT_AMBULATORY_CARE_PROVIDER_SITE_OTHER): Payer: PRIVATE HEALTH INSURANCE | Admitting: General Surgery

## 2013-08-07 VITALS — BP 122/76 | HR 76 | Temp 98.5°F | Resp 14 | Ht 70.0 in | Wt 231.2 lb

## 2013-08-07 DIAGNOSIS — C50919 Malignant neoplasm of unspecified site of unspecified female breast: Secondary | ICD-10-CM

## 2013-08-07 NOTE — Progress Notes (Signed)
Procedure:  Left mastectomy and sentinel lymph node biopsy  Date:July 2013  Pathology: Stage I invasive ductal carcinoma, receptor positive t  Hx:  She is here for long-term followup visit. 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 She is taking tamoxifen and is tolerating this fairly well. She had pre-existing hot flashes before taking it and continues to have these. She denies any chest wall nodules or any right breast masses. No adenopathy.  PE: Catherine Atkins looks well and is in no acute distress the  Breasts-Right breast is without masses, suspicious skin changes, or nipple discharge. Left chest wall demonstrates a transverse scar with a well-circumscribed seroma. No chest wall nodularity.  Lymph nodes-No palpable cervical, supraclavicular, or axillary adenopathy.  Assessment:  Invasive left breast cancer status post left mastectomy and sentinel lymph node biopsy. Exam is unchanged based on Dr. Dickie La note.  Chronic seroma mimicking an implant remains. She does not want anything done about it.  Plan:  Return visit 3 months.

## 2013-08-07 NOTE — Patient Instructions (Signed)
Will see back in 3 months. Call if you notices any changes in her chest wall or breast.

## 2013-11-21 ENCOUNTER — Encounter (INDEPENDENT_AMBULATORY_CARE_PROVIDER_SITE_OTHER): Payer: Self-pay | Admitting: General Surgery

## 2014-01-24 ENCOUNTER — Other Ambulatory Visit: Payer: Self-pay | Admitting: *Deleted

## 2014-01-24 ENCOUNTER — Ambulatory Visit
Admission: RE | Admit: 2014-01-24 | Discharge: 2014-01-24 | Disposition: A | Discharge status: Home / Self Care | Payer: PRIVATE HEALTH INSURANCE | Source: Ambulatory Visit | Attending: Physician Assistant | Admitting: Physician Assistant

## 2014-01-24 ENCOUNTER — Other Ambulatory Visit (HOSPITAL_BASED_OUTPATIENT_CLINIC_OR_DEPARTMENT_OTHER): Payer: PRIVATE HEALTH INSURANCE

## 2014-01-24 DIAGNOSIS — Z780 Asymptomatic menopausal state: Secondary | ICD-10-CM

## 2014-01-24 DIAGNOSIS — Z853 Personal history of malignant neoplasm of breast: Secondary | ICD-10-CM

## 2014-01-24 DIAGNOSIS — C50419 Malignant neoplasm of upper-outer quadrant of unspecified female breast: Secondary | ICD-10-CM

## 2014-01-24 DIAGNOSIS — C50919 Malignant neoplasm of unspecified site of unspecified female breast: Secondary | ICD-10-CM

## 2014-01-24 LAB — CBC WITH DIFFERENTIAL/PLATELET
BASO%: 0.8 % (ref 0.0–2.0)
BASOS ABS: 0.1 10*3/uL (ref 0.0–0.1)
EOS%: 2 % (ref 0.0–7.0)
Eosinophils Absolute: 0.2 10*3/uL (ref 0.0–0.5)
HCT: 35.2 % (ref 34.8–46.6)
HEMOGLOBIN: 11.8 g/dL (ref 11.6–15.9)
LYMPH%: 35.9 % (ref 14.0–49.7)
MCH: 31.4 pg (ref 25.1–34.0)
MCHC: 33.5 g/dL (ref 31.5–36.0)
MCV: 93.6 fL (ref 79.5–101.0)
MONO#: 0.5 10*3/uL (ref 0.1–0.9)
MONO%: 5.9 % (ref 0.0–14.0)
NEUT#: 4.3 10*3/uL (ref 1.5–6.5)
NEUT%: 55.4 % (ref 38.4–76.8)
Platelets: 250 10*3/uL (ref 145–400)
RBC: 3.76 10*6/uL (ref 3.70–5.45)
RDW: 13.2 % (ref 11.2–14.5)
WBC: 7.8 10*3/uL (ref 3.9–10.3)
lymph#: 2.8 10*3/uL (ref 0.9–3.3)

## 2014-01-24 LAB — COMPREHENSIVE METABOLIC PANEL (CC13)
ALBUMIN: 3.3 g/dL — AB (ref 3.5–5.0)
ALK PHOS: 39 U/L — AB (ref 40–150)
ALT: 20 U/L (ref 0–55)
AST: 25 U/L (ref 5–34)
Anion Gap: 8 mEq/L (ref 3–11)
BILIRUBIN TOTAL: 0.28 mg/dL (ref 0.20–1.20)
BUN: 15.1 mg/dL (ref 7.0–26.0)
CO2: 27 mEq/L (ref 22–29)
CREATININE: 1.4 mg/dL — AB (ref 0.6–1.1)
Calcium: 9.2 mg/dL (ref 8.4–10.4)
Chloride: 108 mEq/L (ref 98–109)
Glucose: 102 mg/dl (ref 70–140)
POTASSIUM: 3.9 meq/L (ref 3.5–5.1)
Sodium: 143 mEq/L (ref 136–145)
Total Protein: 6.9 g/dL (ref 6.4–8.3)

## 2014-01-27 ENCOUNTER — Telehealth: Payer: Self-pay | Admitting: Oncology

## 2014-01-30 ENCOUNTER — Ambulatory Visit: Payer: PRIVATE HEALTH INSURANCE | Admitting: Oncology

## 2014-01-30 ENCOUNTER — Ambulatory Visit (HOSPITAL_BASED_OUTPATIENT_CLINIC_OR_DEPARTMENT_OTHER): Payer: PRIVATE HEALTH INSURANCE | Admitting: Adult Health

## 2014-01-30 ENCOUNTER — Encounter: Payer: Self-pay | Admitting: Adult Health

## 2014-01-30 ENCOUNTER — Telehealth: Payer: Self-pay | Admitting: *Deleted

## 2014-01-30 VITALS — BP 113/81 | HR 69 | Temp 98.5°F | Resp 18 | Ht 70.0 in | Wt 226.9 lb

## 2014-01-30 DIAGNOSIS — Z170 Estrogen receptor positive status [ER+]: Secondary | ICD-10-CM

## 2014-01-30 DIAGNOSIS — C50912 Malignant neoplasm of unspecified site of left female breast: Secondary | ICD-10-CM

## 2014-01-30 DIAGNOSIS — T792XXA Traumatic secondary and recurrent hemorrhage and seroma, initial encounter: Secondary | ICD-10-CM

## 2014-01-30 DIAGNOSIS — C50919 Malignant neoplasm of unspecified site of unspecified female breast: Secondary | ICD-10-CM

## 2014-01-30 NOTE — Patient Instructions (Signed)
You are doing well.  You have no sign of recurrence.  I recommend that you continue taking Tamoxifen daily.  I recommend healthy diet, exercise, and monthly breast exams.  We will see you back in 6 months to one year.  Please call us if you have any questions or concerns.    Breast Self-Awareness Practicing breast self-awareness may pick up problems early, prevent significant medical complications, and possibly save your life. By practicing breast self-awareness, you can become familiar with how your breasts look and feel and if your breasts are changing. This allows you to notice changes early. It can also offer you some reassurance that your breast health is good. One way to learn what is normal for your breasts and whether your breasts are changing is to do a breast self-exam. If you find a lump or something that was not present in the past, it is best to contact your caregiver right away. Other findings that should be evaluated by your caregiver include nipple discharge, especially if it is bloody; skin changes or reddening; areas where the skin seems to be pulled in (retracted); or new lumps and bumps. Breast pain is seldom associated with cancer (malignancy), but should also be evaluated by a caregiver. HOW TO PERFORM A BREAST SELF-EXAM The best time to examine your breasts is 5-7 days after your menstrual period is over. During menstruation, the breasts are lumpier, and it may be more difficult to pick up changes. If you do not menstruate, have reached menopause, or had your uterus removed (hysterectomy), you should examine your breasts at regular intervals, such as monthly. If you are breastfeeding, examine your breasts after a feeding or after using a breast pump. Breast implants do not decrease the risk for lumps or tumors, so continue to perform breast self-exams as recommended. Talk to your caregiver about how to determine the difference between the implant and breast tissue. Also, talk about the  amount of pressure you should use during the exam. Over time, you will become more familiar with the variations of your breasts and more comfortable with the exam. A breast self-exam requires you to remove all your clothes above the waist. 1. Look at your breasts and nipples. Stand in front of a mirror in a room with good lighting. With your hands on your hips, push your hands firmly downward. Look for a difference in shape, contour, and size from one breast to the other (asymmetry). Asymmetry includes puckers, dips, or bumps. Also, look for skin changes, such as reddened or scaly areas on the breasts. Look for nipple changes, such as discharge, dimpling, repositioning, or redness. 2. Carefully feel your breasts. This is best done either in the shower or tub while using soapy water or when flat on your back. Place the arm (on the side of the breast you are examining) above your head. Use the pads (not the fingertips) of your three middle fingers on your opposite hand to feel your breasts. Start in the underarm area and use  inch (2 cm) overlapping circles to feel your breast. Use 3 different levels of pressure (light, medium, and firm pressure) at each circle before moving to the next circle. The light pressure is needed to feel the tissue closest to the skin. The medium pressure will help to feel breast tissue a little deeper, while the firm pressure is needed to feel the tissue close to the ribs. Continue the overlapping circles, moving downward over the breast until you feel your ribs below  your breast. Then, move one finger-width towards the center of the body. Continue to use the  inch (2 cm) overlapping circles to feel your breast as you move slowly up toward the collar bone (clavicle) near the base of the neck. Continue the up and down exam using all 3 pressures until you reach the middle of the chest. Do this with each breast, carefully feeling for lumps or changes. 3.  Keep a written record with  breast changes or normal findings for each breast. By writing this information down, you do not need to depend only on memory for size, tenderness, or location. Write down where you are in your menstrual cycle, if you are still menstruating. Breast tissue can have some lumps or thick tissue. However, see your caregiver if you find anything that concerns you.  SEEK MEDICAL CARE IF:  You see a change in shape, contour, or size of your breasts or nipples.   You see skin changes, such as reddened or scaly areas on the breasts or nipples.   You have an unusual discharge from your nipples.   You feel a new lump or unusually thick areas.  Document Released: 05/23/2005 Document Revised: 05/09/2012 Document Reviewed: 09/07/2011 [facility] Patient Information 2015 [place], [place]. This information is not intended to replace advice given to you by your health care provider. Make sure you discuss any questions you have with your health care provider.

## 2014-01-30 NOTE — Progress Notes (Signed)
[place]  [phone] Fax:(336) [phone]     ID: Catherine Atkins OB: 08/27/1950  MR#: [id number]  ZCH#:[id number]  PCP: Asencion Noble, MD GYN:   SU:  Dr. Zella Richer, MD OTHER MD: Kyung Rudd, MD  CHIEF COMPLAINT: Hx of Left Breast Cancer   HISTORY OF PRESENT ILLNESS: Catherine Atkins was diagnosed with a left breast cancer in 2013, and underwent a left mastectomy on 01/25/2012 for a pT1c, N0, stage IA invasive ductal carcinoma, grade 2. Tumor was ER positive at 100%, PR +80%, HER-2/neu negative, with an MIB-1 of 10%.  Oncotype score was 27, predicting a rate of distant recurrence within 10 years of 18% the patient took tamoxifen for 5 years. Tamoxifen was started in April of 2014.  Subsequent history is as detailed below.   INTERVAL HISTORY: Artesia Berkey returns today for routine six-month followup of her left breast carcinoma. She is taking Tamoxifen daily and is tolerating it well.  She has tolerable hot flashes that she experiences occasionally.  She denies vaginal bleeding/spotting/discharge, swelling of the legs or any blood clots, joint pain, weakness, numbness, headaches, vision changes, new pain, bowel/bladder changes, shortness of breath, cough, or any further concerns.  We updated her social and family history along with her health maintenance below.    REVIEW OF SYSTEMS: A 10 point review of systems was conducted and is otherwise negative except for what is noted above.     Catherine Atkins  PAST MEDICAL HISTORY: Past Medical History  Diagnosis Date  . Depression   . Hypertension   . Cancer     lt breast  . Cancer of left breast 12/07/2011    Mammographically detectged, Dx by [facility] as IDC, Receptor+, Her 2 Negative  . Allergy     codeine  . Blood transfusion     years ago/    PAST SURGICAL HISTORY: Past Surgical History  Procedure Laterality Date  . Abdominal hysterectomy  1989    b/l salpingoo=oppherecty  . Breast biopsy  12/08/11    Left breast 9  o'clock  bx=invasive mammary ca,ER/PR=+,IDC Her2 neg  . Colonoscopy    . Appendectomy    . Mastectomy  01/25/12    let mastectomy    FAMILY HISTORY (Updated 01/30/2014) Family History  Problem Relation Age of Onset  . Cancer Mother     breast  . Cancer Sister     breast  . Leukemia Father   . Heart attack Brother   . Prostate cancer Maternal Uncle     GYNECOLOGIC HISTORY:  menarche at 63, GX P0, hysterectomy/BSO at age 63, was on HRT w/ premarin for ~ 15 years.  SOCIAL HISTORY: (Updated 01/30/2014) She is a Freight forwarder for replacements, working mostly weekends. Her husband Sonia Side is a retired Estate manager/land agent. He has 3 children from a prior marriage, none in this area. The patient is not a church attender.   ADVANCED DIRECTIVES: In place   HEALTH MAINTENANCE: (updated 01/30/2014)  History  Substance Use Topics  . Smoking status: Never Smoker   . Smokeless tobacco: Never Used  . Alcohol Use: No    Mammogram: 01/24/2014  Colonoscopy: Due in 2015 (GI is in [place]), patient says will schedule soon  PAP: Status post hysterectomy; has pelvic exams with Dr. Willey Blade,  UTD  Bone density: 01/24/2014 normal  Lipid panel: UTD, Dr. Willey Blade   Allergies  Allergen Reactions  . Codeine Itching    Current Outpatient Prescriptions  Medication Sig Dispense Refill  .  acetaminophen (TYLENOL) 500 MG tablet Take 500 mg by mouth every 6 (six) hours as needed. Take 2 515m po prn headacjhes       . amitriptyline (ELAVIL) 25 MG tablet Take 25 mg by mouth at bedtime.      .Marland KitchenamLODipine-benazepril (LOTREL) 5-20 MG per capsule Take 1 capsule by mouth daily.      .Marland Kitchenatenolol (TENORMIN) 100 MG tablet Take 100 mg by mouth daily.      .Marland KitchenbuPROPion (WELLBUTRIN XL) 300 MG 24 hr tablet Take 300 mg by mouth daily.      . cloNIDine (CATAPRES) 0.1 MG tablet Take 0.1 mg by mouth 2 (two) times daily.      . tamoxifen (NOLVADEX) 20 MG tablet Take 1 tablet (20 mg total) by mouth daily.  30  tablet  11  . triamterene-hydrochlorothiazide (MAXZIDE) 75-50 MG per tablet Take 1 tablet by mouth daily.       No current facility-administered medications for this visit.    OBJECTIVE:  Middle-aged white female who appears comfortable and is in no acute distress. Filed Vitals:   01/30/14 1006  BP: 113/81  Pulse: 69  Temp: 98.5 F (36.9 C)  Resp: 18     Body mass index is 32.56 kg/(m^2).    ECOG FS: 0 Filed Weights   01/30/14 1006  Weight: 226 lb 14.4 oz (102.921 kg)   Physical Exam: GENERAL: Patient is a well appearing female in no acute distress HEENT:  Sclerae anicteric.  Oropharynx clear and moist. No ulcerations or evidence of oropharyngeal candidiasis. Neck is supple.  NODES:  No cervical, supraclavicular, or axillary lymphadenopathy palpated.  BREAST EXAM:  Left mastectomy site with large amount of fluid, has history of seroma, discoloration ( very faint ecchymosis at the superficial skin in an asymmetric pattern.   LUNGS:  Clear to auscultation bilaterally.  No wheezes or rhonchi. HEART:  Regular rate and rhythm. No murmur appreciated. ABDOMEN:  Soft, nontender.  Positive, normoactive bowel sounds. No organomegaly palpated. MSK:  No focal spinal tenderness to palpation. Full range of motion bilaterally in the upper extremities. EXTREMITIES:  No peripheral edema.   SKIN:  Clear with no obvious rashes or skin changes. No nail dyscrasia. NEURO:  Nonfocal. Well oriented.  Appropriate affect.     LAB RESULTS:   Lab Results  Component Value Date   WBC 7.8 01/24/2014   NEUTROABS 4.3 01/24/2014   HGB 11.8 01/24/2014   HCT 35.2 01/24/2014   MCV 93.6 01/24/2014   PLT 250 01/24/2014      Chemistry      Component Value Date/Time   NA 143 01/24/2014 1025   NA 139 01/23/2012 0915   K 3.9 01/24/2014 1025   K 4.6 01/23/2012 0915   CL 107 11/27/2012 0830   CL 102 01/23/2012 0915   CO2 27 01/24/2014 1025   CO2 26 01/23/2012 0915   BUN 15.1 01/24/2014 1025   BUN 15 01/23/2012 0915     CREATININE 1.4* 01/24/2014 1025   CREATININE 1.16* 01/23/2012 0915      Component Value Date/Time   CALCIUM 9.2 01/24/2014 1025   CALCIUM 9.7 01/23/2012 0915   ALKPHOS 39* 01/24/2014 1025   ALKPHOS 64 01/23/2012 0915   AST 25 01/24/2014 1025   AST 21 01/23/2012 0915   ALT 20 01/24/2014 1025   ALT 19 01/23/2012 0915   BILITOT 0.28 01/24/2014 1025   BILITOT 0.2* 01/23/2012 0915       STUDIES:  Most recent mammogram  was a 2014 and was unremarkable with the exception of a large left seroma which was stable.  Most recent bone density was 9/12 13 and was normal.   ASSESSMENT: 63 y.o. [place] woman   (1) S/p mastectomy on 01/25/2012 for mpT1c, stage N0, stage IA invasive ductal carcinoma, grade 2, estrogen receptor 100% positive, progesterone receptor 80% positive, with no HER-2 amplification, and an MIB-1 of 10%.  (2) Oncotype score of 27 predicts a rate of distant recurrence within 10 years of 18% if the patient takes tamoxifen for 5 years   (3) tamoxifen started April 2014   (4) s/p remote hysterectomy/BSO  PLAN:  Sumer Moorehouse is doing well today.  She will continue Tamoxifen as she is tolerating it relatively well.  She continues to have her left breast seroma, and it is large.  There is a slight discoloration just under the skin of the breast that is difficult to put into words and very faint and difficult to see. She does tell me that the left breast is now harder than prior.  There is no pain at the site.  She has not followed up with Dr. Zella Richer recently and she tells me that she is over due for this.  We have called [facility] and she has an appointment with Dr. Zella Richer next week.    I did recommend healthy diet, exercise, and monthly breast exams.  I gave her detailed instructions on this in her AVS.  She will return in 6 months for follow up or sooner if needed.  Our plan is to continue tamoxifen for a minimum of 5 years, possibly continuing as long as 10 years depending on her  tolerance. All this was reviewed in detail with [facility] and today. She voices understanding and agreement with this plan, and will call with any changes or problems.   I spent 25 minutes counseling the patient face to face.  The total time spent in the appointment was 30 minutes.  Minette Headland, [place] [phone] 01/30/2014 4:09 PM

## 2014-02-05 ENCOUNTER — Encounter (INDEPENDENT_AMBULATORY_CARE_PROVIDER_SITE_OTHER): Payer: PRIVATE HEALTH INSURANCE | Admitting: General Surgery

## 2014-02-06 ENCOUNTER — Other Ambulatory Visit: Payer: Self-pay | Admitting: Oncology

## 2014-04-29 ENCOUNTER — Telehealth: Payer: Self-pay

## 2014-04-29 IMAGING — US US BREAST*L*
1 series · 6 of 6 positions shown · non-contrast
Comparison: Screening mammogram 11/24/2011

***ADDENDUM*** CREATED: 12/16/2011 [DATE]

This addendum is created to correct a typographical error regarding
the position of the mass seen on ultrasound in the left breast.  It
was incorrectly stated that the mass is in the 9 o'clock position
Correction:  The suspicious mass in the periareolar left breast
seen on ultrasound is in the 3 o'clock position.
***END ADDENDUM*** SIGNED BY: Oshioke Nimmyel, M.D.
CLINICAL DATA: Possible mass/distortion seen in the left breast on
recent screening mammogram. The patient reports that her sister and
mother have a history of breast cancer.
DIGITAL DIAGNOSTIC THE LEFT MAMMOGRAM WITHOUT CAD AND LEFT BREAST
ULTRASOUND:

[Series 1: us breast*left* · 0.07mm/px · 6 of 6 slices shown]
[im 1/6]
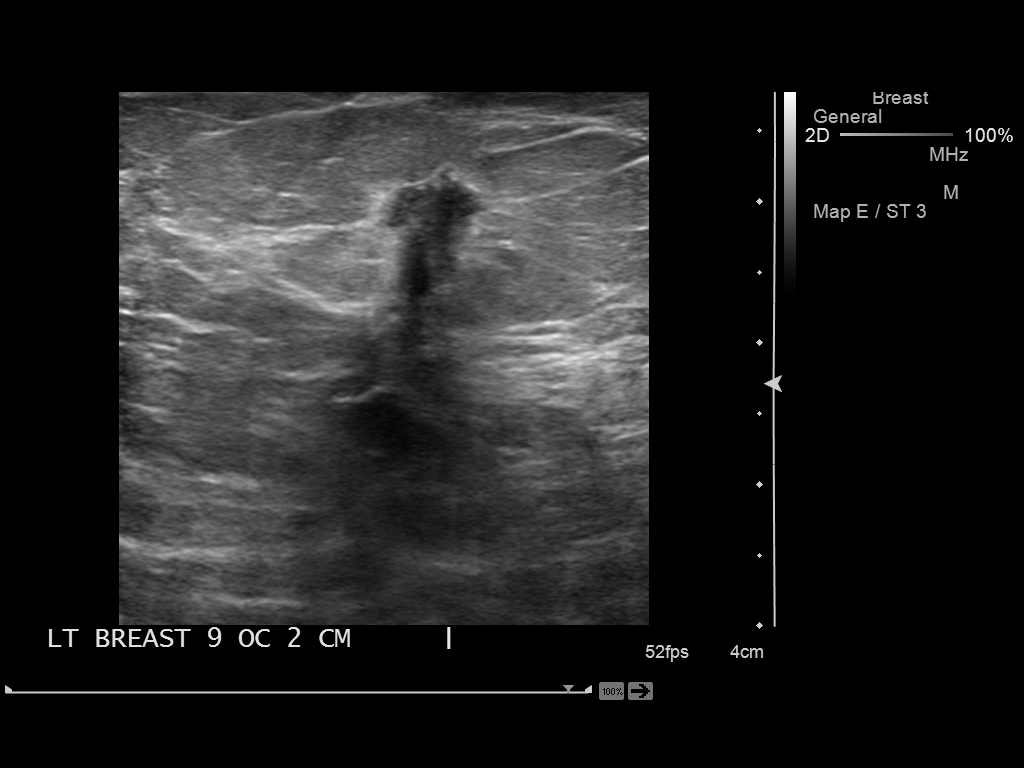
[im 2/6]
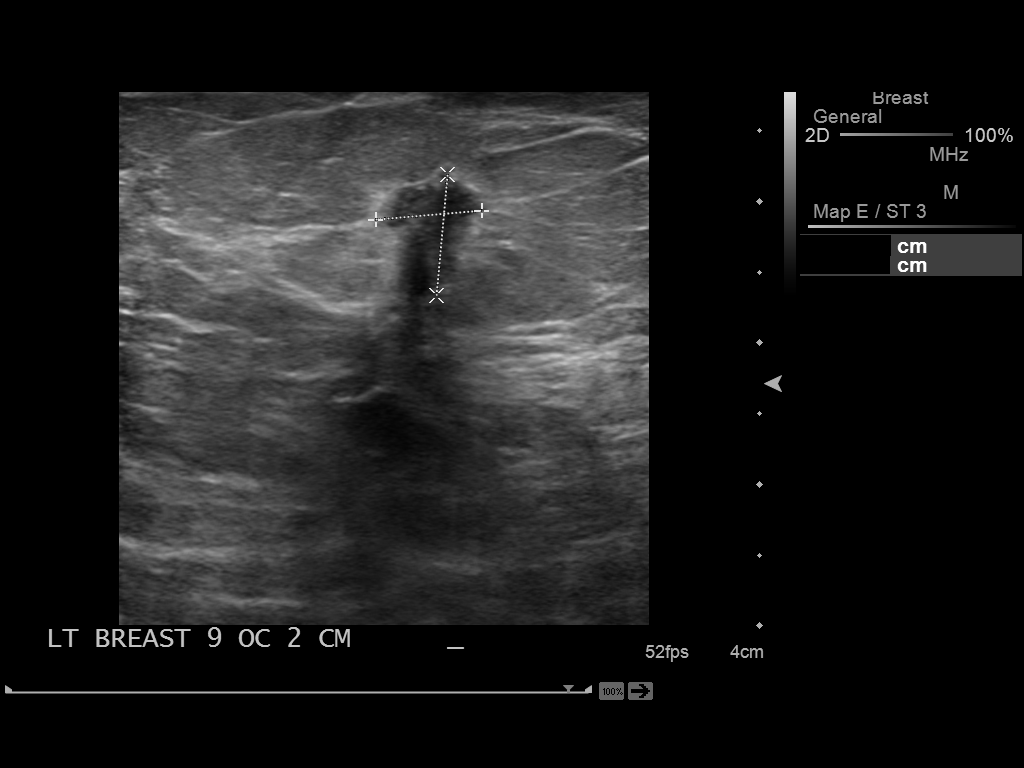
[im 3/6]
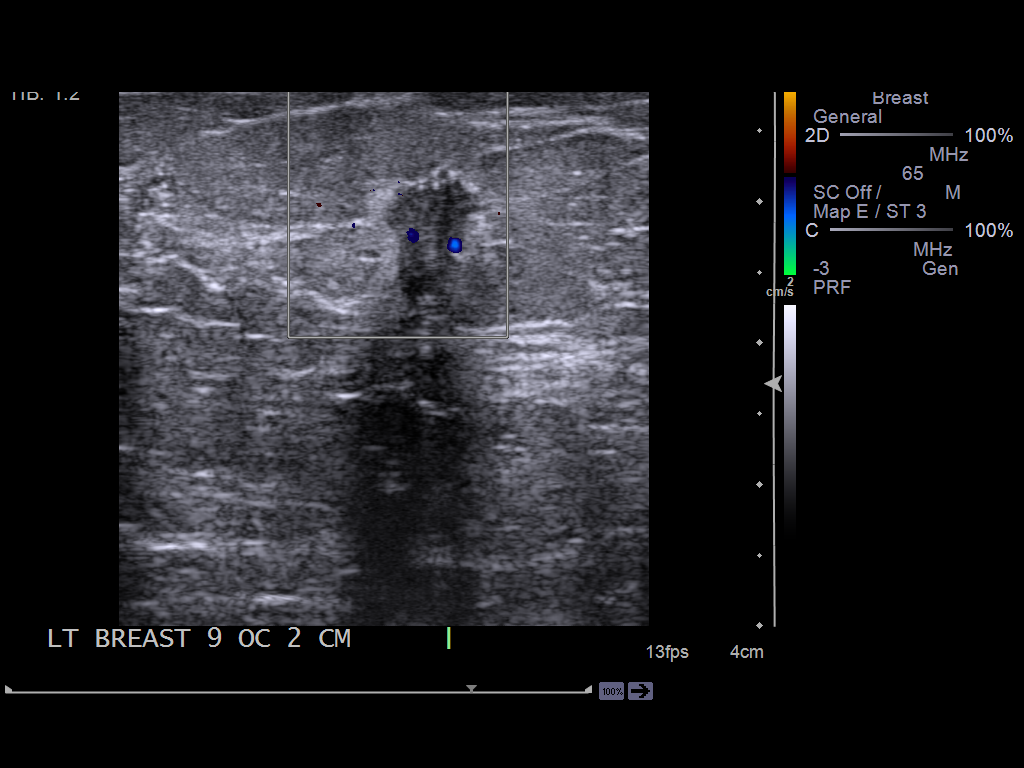
[im 4/6]
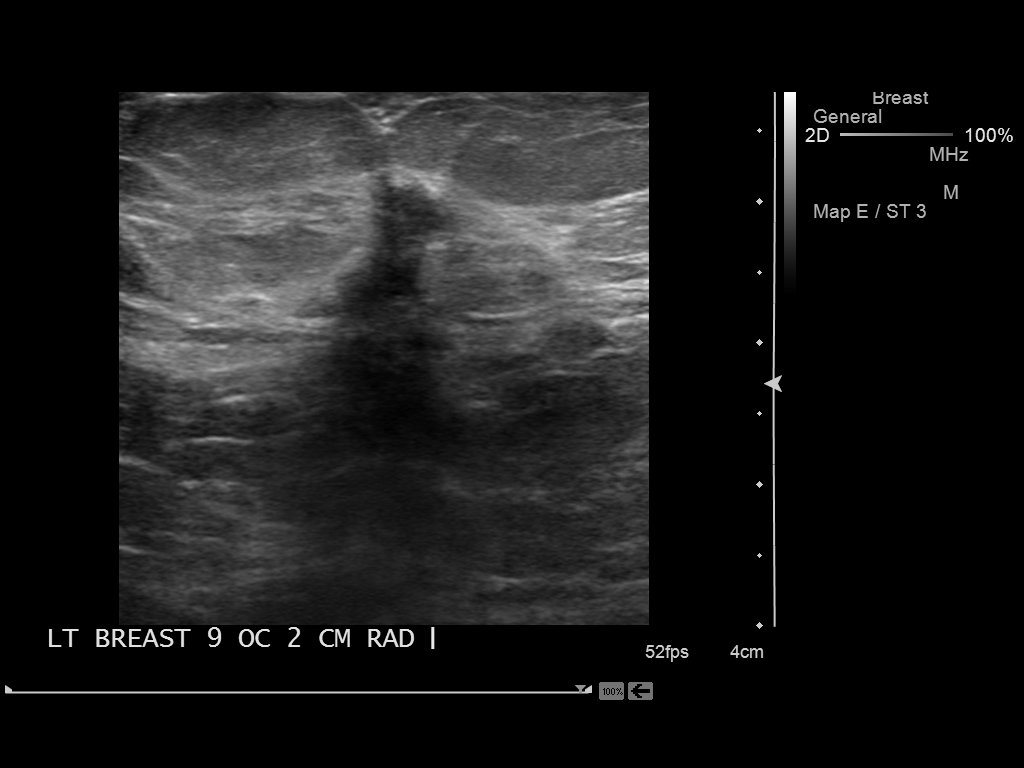
[im 5/6]
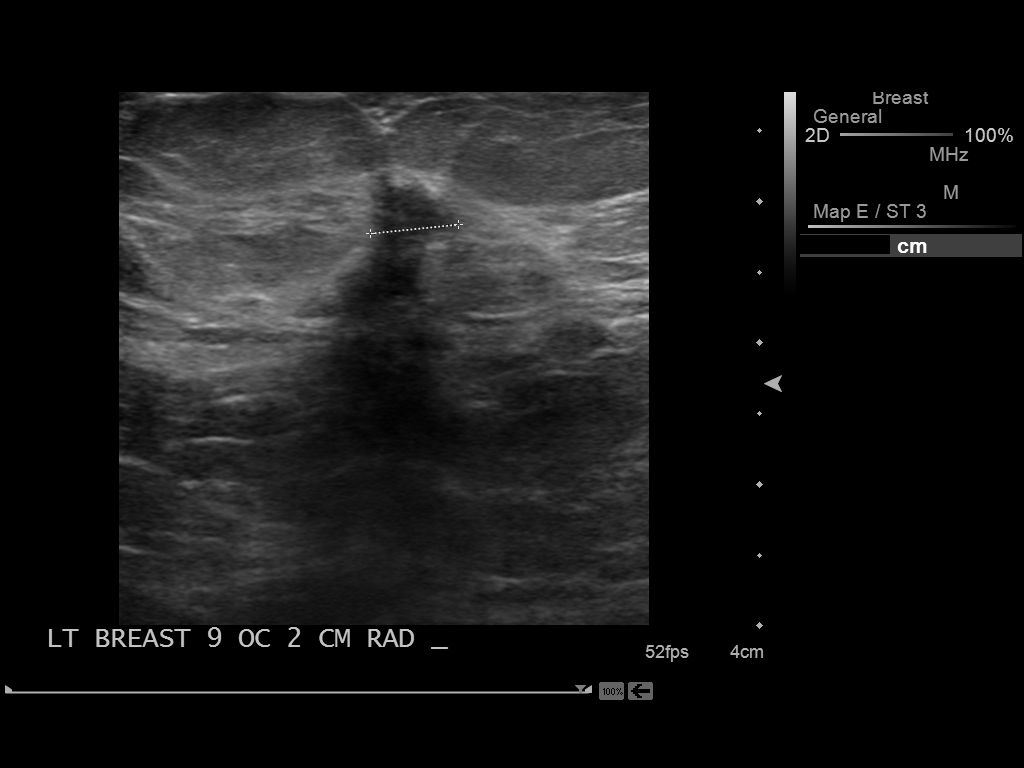
[im 6/6]
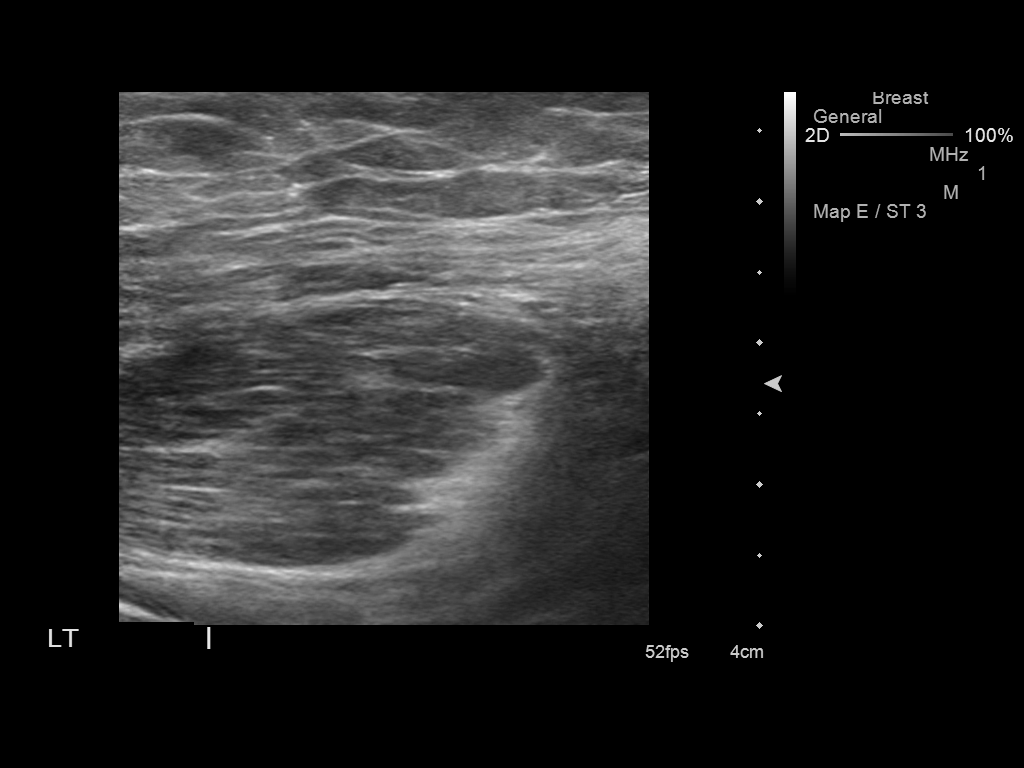

[6 of 6 positions shown; findings below may reference images not displayed]

FINDINGS: Focal spot compression views of the upper outer left
breast, posterior third, demonstrate a persistent area of
distortion, with a possible small mass.

On physical exam, no mass is palpated in the upper outer left
breast on physical exam.

Ultrasound is performed, showing scattered nonmass-like areas of
shadowing throughout the upper left breast.  No discrete,
reproducible, and three-dimensional ultrasound correlate to the
subtle area of distortion  on mammogram is identified.  Ultrasound
of the left breast at 9 o'clock position approximately 2-3 cm from
the nipple demonstrates an irregular angulated hypoechoic mass with
some internal vascularity that measures 0.8 x 0.9 x 0.6 cm.

Ultrasound of the left axilla is negative  for lymphadenopathy.

A mammogram was performed with a metallic BB on the left breast,
overlying the periareolar areolar left breast mass.  This breast
mass is not thought to correspond to the area of distortion is seen
on recent screening mammogram.
IMPRESSION: Suspicious 9 mm left breast mass at 9 o'clock position.  Ultrasound-
guided core needle biopsy is recommended and will be performed
today and dictated separately.  It is discussed with the patient
that the area to be biopsied today does not explain the area of
distortion seen more posteriorly in the upper outer left breast on
the recent screening mammogram.  I do not identify a discrete
ultrasound correlate to the area of concern on mammography.  I have
discussed with the patient that further evaluation of this area of
distortion is warranted, and will be determined after the pathology
results are received on the biopsied mass in the 9 o'clock
position.

RECOMMENDATION:
Ultrasound guided core needle biopsy of the left breast, to be
performed today and dictated separately.

BI-RADS CATEGORY 4:  Suspicious abnormality - biopsy should be
considered.

## 2014-04-29 IMAGING — US US BIOPSY
1 series · 13 of 13 positions shown · non-contrast
Comparison: Previous exams

***ADDENDUM*** CREATED: 12/09/2011 [DATE]

Pathology results demonstrate invasive mammary carcinoma.
Pathology results are concordant with imaging findings.
Pathology results were called to Dr. Celia.
Breast MRI has been scheduled for 12/16/2011 [DATE] at [HOSPITAL].
Surgical consultation with Dr. Che Wai has been scheduled for [DATE] on 12/30/2011.
I telephoned to Mrs. Blain today with pathology results and
discussed her follow-up appointments. She reports some bruising at
the biopsy sites, but has no concerns. She was given our telephone
number at The [REDACTED], if she should
have any additional questions.
***END ADDENDUM*** SIGNED BY: Nathali Orourke, M.D.
***ADDENDUM*** CREATED: 12/16/2011 [DATE]
This addendum is created to correct typographical error regarding
location of the biopsied mass in the left breast. ( It was
incorrectly stated that the mass was in the 9 o'clock position.)
 The biopsied mass is in the periareolar left breast at 3 o'clock
position.
CLINICAL DATA: Suspicious 9 mm mass 9 o'clock position left
breast, 2 cm from the nipple.
ULTRASOUND GUIDED VACUUM ASSISTED CORE BIOPSY OF THE LEFT BREAST

[Series 1: us biopsy · 0.06mm/px · 13 of 13 slices shown]
[im 1/13]
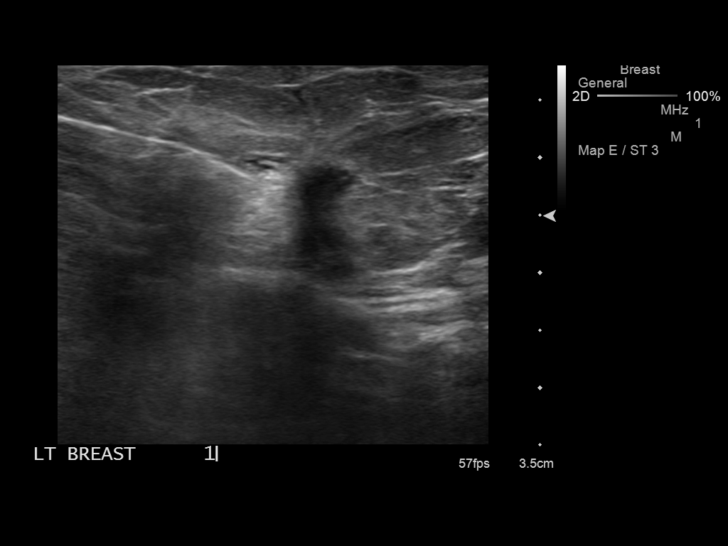
[im 2/13]
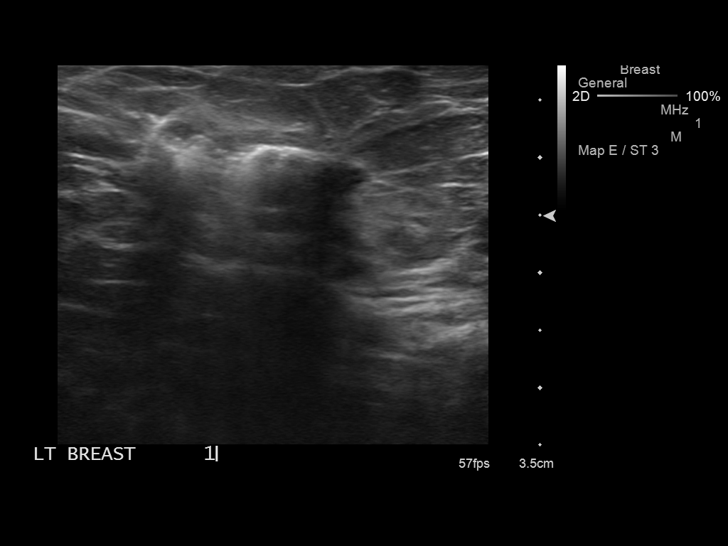
[im 3/13]
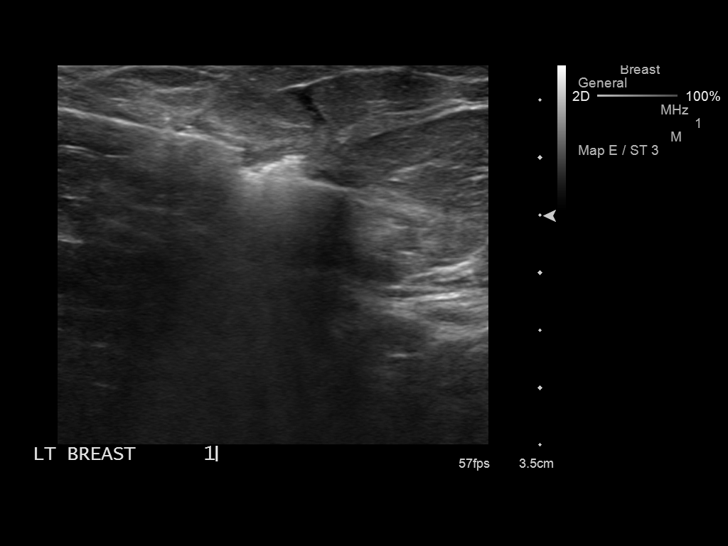
[im 4/13]
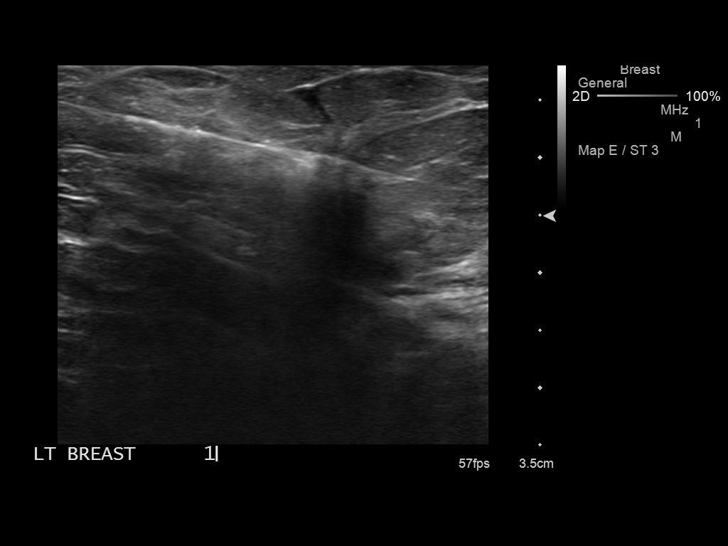
[im 5/13]
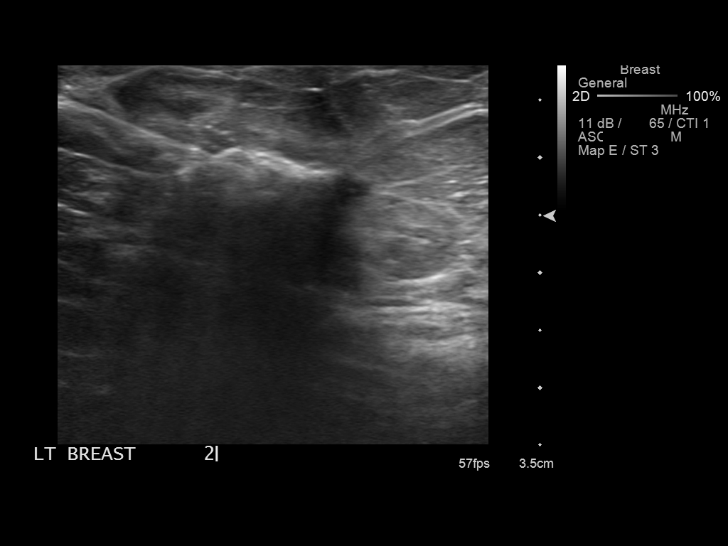
[im 6/13]
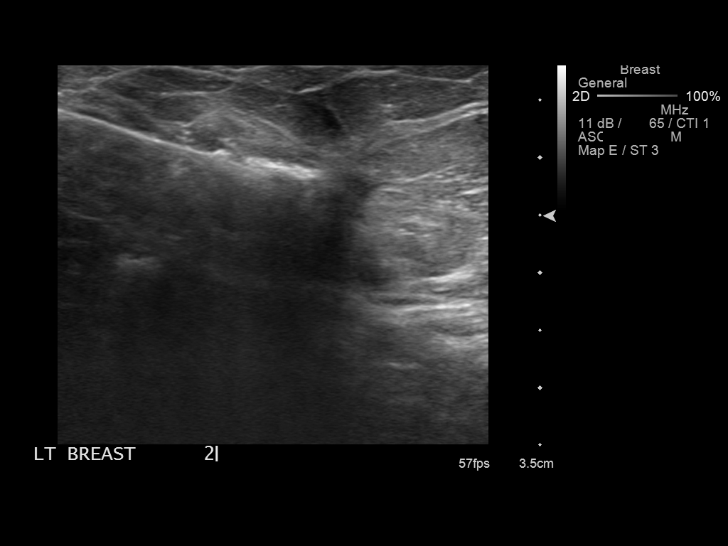
[im 7/13]
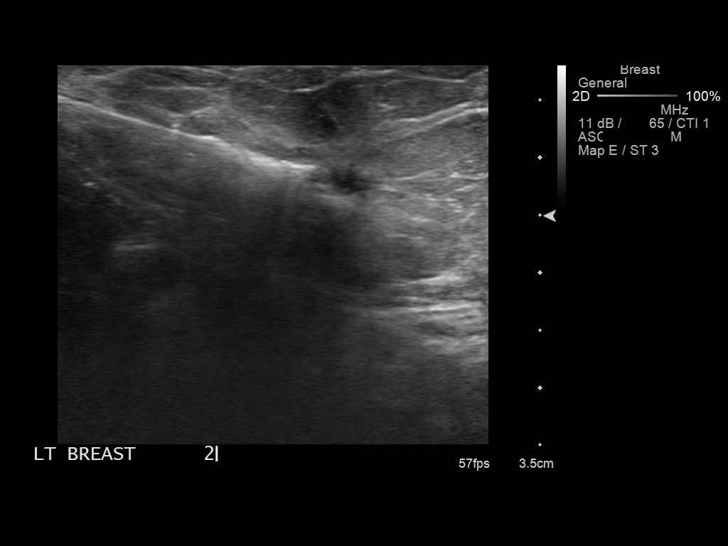
[im 8/13]
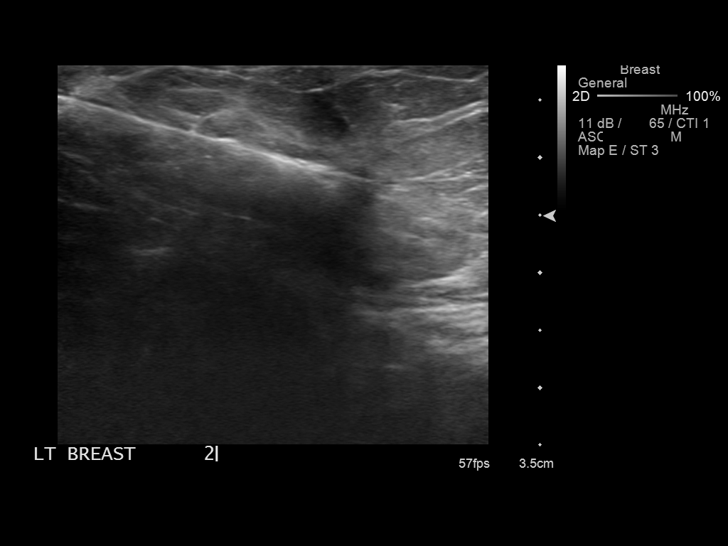
[im 9/13]
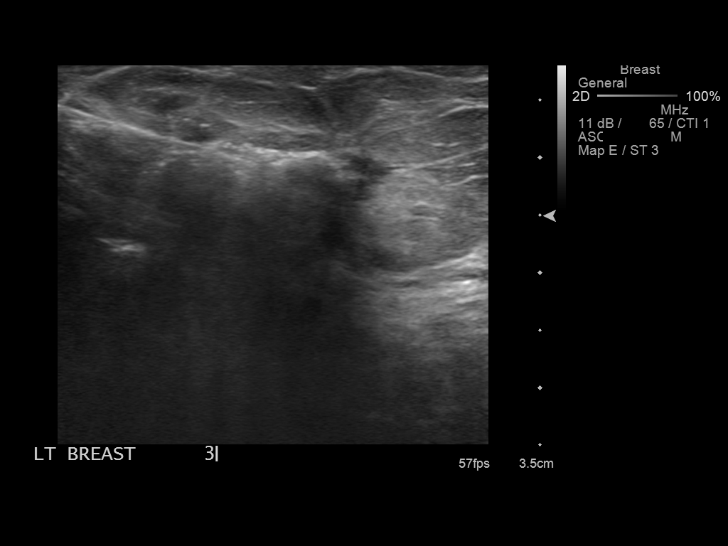
[im 10/13]
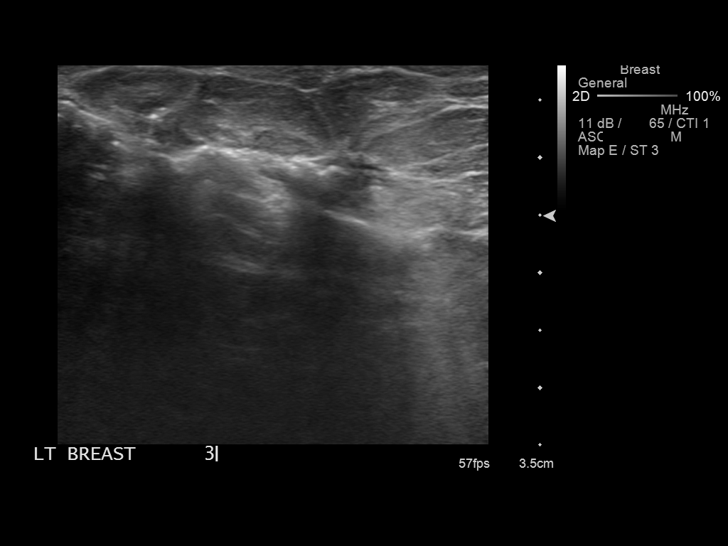
[im 11/13]
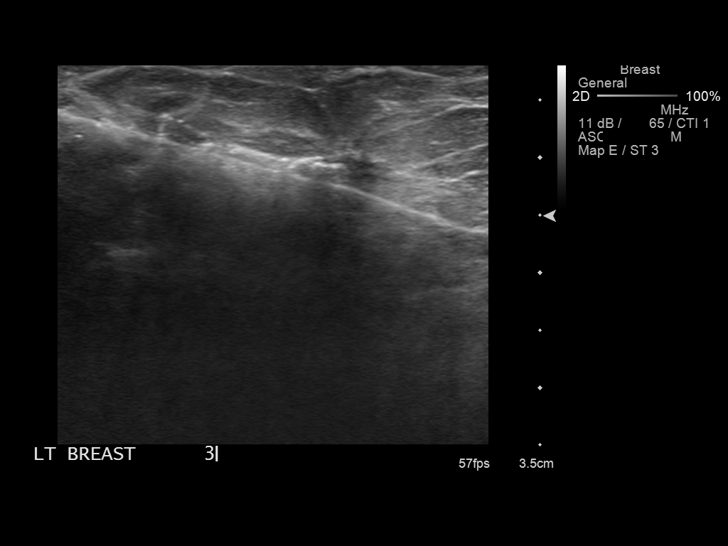
[im 12/13]
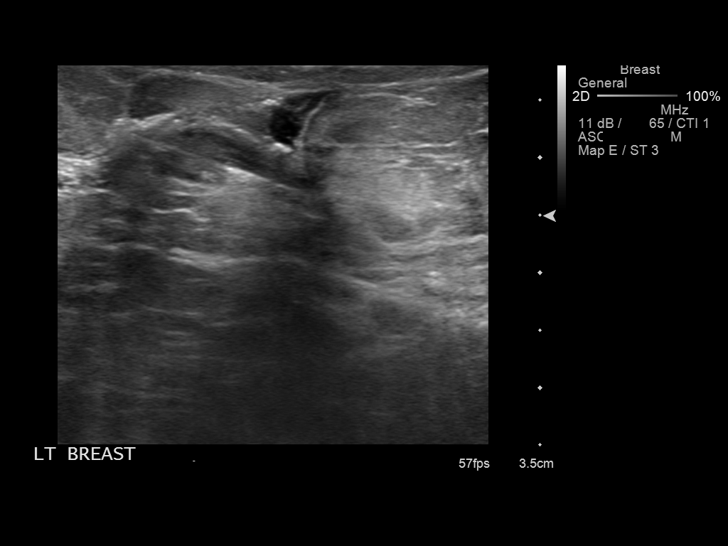
[im 13/13]
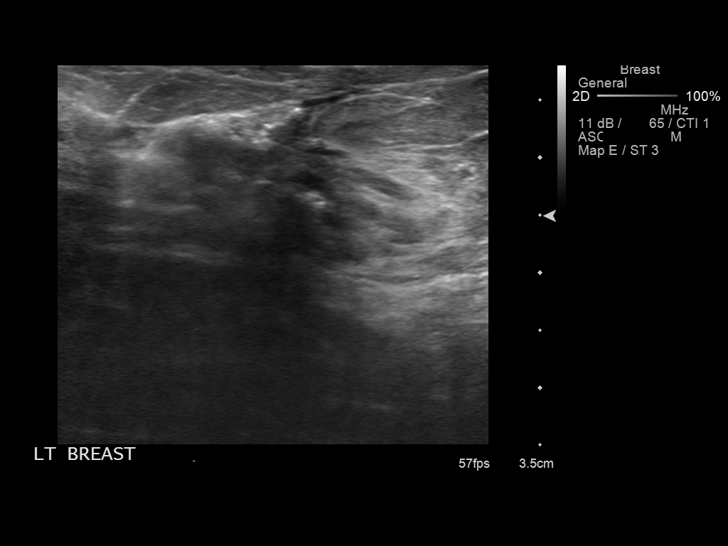

[13 of 13 positions shown; findings below may reference images not displayed]

I met with the patient and we discussed the procedure of ultrasound-
guided biopsy, including benefits and alternatives.  We discussed
the high likelihood of a successful procedure. We discussed the
risks of the procedure including infection, bleeding, tissue
injury, clip migration, and inadequate sampling.  Informed written
consent was given.

Using sterile technique, 2% lidocaine ultrasound guidance and a 12
gauge vacuum assisted needle biopsy was performed of a 9 x 8 x 6 mm
suspicious solid mass 9 o'clock position left breast 2 to 3 cm from
the nipple, using an inferior to superior approach.  At the
conclusion of the procedure, a a ribbon-shaped tissue marker clip
was deployed into the biopsy cavity.  Follow-up 2-view mammogram
was performed and dictated separately.
IMPRESSION: Ultrasound-guided biopsy of left breast mass.  No apparent
complications.

## 2014-04-29 NOTE — Telephone Encounter (Signed)
PATIENT CALLED TO SCHEDULE COLONOSCOPY.  PLEASE CALL BACK   [phone]

## 2014-05-06 NOTE — Telephone Encounter (Signed)
Pt's colonoscopy was 12/05/2002 by Dr. Gala Romney. She was sent a recall letter on 11/08/2012.  I have called and left message for a return call.

## 2014-06-03 NOTE — Telephone Encounter (Signed)
LM for pt to call. Her husband said she just left for work and will try to call tomorrow.

## 2014-06-16 NOTE — Telephone Encounter (Signed)
Letter reminder mailed to pt.  

## 2014-07-22 ENCOUNTER — Other Ambulatory Visit: Payer: Self-pay | Admitting: *Deleted

## 2014-07-22 ENCOUNTER — Telehealth: Payer: Self-pay | Admitting: Oncology

## 2014-07-22 DIAGNOSIS — C50419 Malignant neoplasm of upper-outer quadrant of unspecified female breast: Secondary | ICD-10-CM

## 2014-07-22 DIAGNOSIS — C50912 Malignant neoplasm of unspecified site of left female breast: Secondary | ICD-10-CM

## 2014-07-22 MED ORDER — TAMOXIFEN CITRATE 20 MG PO TABS: 20.0000 mg | ORAL_TABLET | Freq: Every day | ORAL | Status: DC

## 2014-07-22 NOTE — Telephone Encounter (Signed)
S/W PT HUSBAND RE APPT FOR LB/FU 2/25 @ 2:45PM.

## 2014-07-31 ENCOUNTER — Encounter: Payer: Self-pay | Admitting: Nurse Practitioner

## 2014-07-31 ENCOUNTER — Ambulatory Visit (HOSPITAL_BASED_OUTPATIENT_CLINIC_OR_DEPARTMENT_OTHER): Payer: PRIVATE HEALTH INSURANCE | Admitting: Nurse Practitioner

## 2014-07-31 ENCOUNTER — Other Ambulatory Visit (HOSPITAL_BASED_OUTPATIENT_CLINIC_OR_DEPARTMENT_OTHER): Payer: PRIVATE HEALTH INSURANCE

## 2014-07-31 ENCOUNTER — Telehealth: Payer: Self-pay | Admitting: Nurse Practitioner

## 2014-07-31 VITALS — BP 116/51 | HR 70 | Temp 98.8°F | Resp 18 | Ht 70.0 in | Wt 230.1 lb

## 2014-07-31 DIAGNOSIS — C50912 Malignant neoplasm of unspecified site of left female breast: Secondary | ICD-10-CM

## 2014-07-31 DIAGNOSIS — T888XXD Other specified complications of surgical and medical care, not elsewhere classified, subsequent encounter: Secondary | ICD-10-CM

## 2014-07-31 DIAGNOSIS — C50812 Malignant neoplasm of overlapping sites of left female breast: Secondary | ICD-10-CM

## 2014-07-31 DIAGNOSIS — C50412 Malignant neoplasm of upper-outer quadrant of left female breast: Secondary | ICD-10-CM

## 2014-07-31 DIAGNOSIS — T792XXD Traumatic secondary and recurrent hemorrhage and seroma, subsequent encounter: Secondary | ICD-10-CM

## 2014-07-31 DIAGNOSIS — Z170 Estrogen receptor positive status [ER+]: Secondary | ICD-10-CM

## 2014-07-31 LAB — CBC WITH DIFFERENTIAL/PLATELET
BASO%: 0.6 % (ref 0.0–2.0)
BASOS ABS: 0.1 10*3/uL (ref 0.0–0.1)
EOS ABS: 0.2 10*3/uL (ref 0.0–0.5)
EOS%: 2 % (ref 0.0–7.0)
HCT: 35.7 % (ref 34.8–46.6)
HEMOGLOBIN: 12 g/dL (ref 11.6–15.9)
LYMPH#: 3.2 10*3/uL (ref 0.9–3.3)
LYMPH%: 38.1 % (ref 14.0–49.7)
MCH: 31.7 pg (ref 25.1–34.0)
MCHC: 33.6 g/dL (ref 31.5–36.0)
MCV: 94.2 fL (ref 79.5–101.0)
MONO#: 0.6 10*3/uL (ref 0.1–0.9)
MONO%: 7.4 % (ref 0.0–14.0)
NEUT%: 51.9 % (ref 38.4–76.8)
NEUTROS ABS: 4.4 10*3/uL (ref 1.5–6.5)
Platelets: 257 10*3/uL (ref 145–400)
RBC: 3.79 10*6/uL (ref 3.70–5.45)
RDW: 13 % (ref 11.2–14.5)
WBC: 8.5 10*3/uL (ref 3.9–10.3)

## 2014-07-31 LAB — COMPREHENSIVE METABOLIC PANEL (CC13)
ALT: 19 U/L (ref 0–55)
ANION GAP: 9 meq/L (ref 3–11)
AST: 26 U/L (ref 5–34)
Albumin: 3.4 g/dL — ABNORMAL LOW (ref 3.5–5.0)
Alkaline Phosphatase: 45 U/L (ref 40–150)
BILIRUBIN TOTAL: 0.28 mg/dL (ref 0.20–1.20)
BUN: 19.9 mg/dL (ref 7.0–26.0)
CALCIUM: 9.1 mg/dL (ref 8.4–10.4)
CHLORIDE: 108 meq/L (ref 98–109)
CO2: 25 meq/L (ref 22–29)
CREATININE: 1.6 mg/dL — AB (ref 0.6–1.1)
EGFR: 34 mL/min/{1.73_m2} — AB (ref >90)
GLUCOSE: 98 mg/dL (ref 70–140)
Potassium: 3.8 mEq/L (ref 3.5–5.1)
SODIUM: 142 meq/L (ref 136–145)
TOTAL PROTEIN: 6.7 g/dL (ref 6.4–8.3)

## 2014-07-31 NOTE — Telephone Encounter (Signed)
per pof ot sch pt appt-gave pt copy of sch °

## 2014-07-31 NOTE — Progress Notes (Signed)
[place]  Telephone:(336) [phone] Fax:(336) [phone]     ID: Catherine Atkins OB: 1950-06-26  MR#: [id number]  MWN#:[id number]  PCP: Asencion Noble, MD GYN:   SU:  Dr. Zella Richer, MD OTHER MD: Kyung Rudd, MD  CHIEF COMPLAINT: Hx of Left Breast Cancer  CURRENT TREATMENT: tamoxifen daily  BREAST CANCER HISTORY Catherine Atkins was diagnosed with a left breast cancer in 2013, and underwent a left mastectomy on 01/25/2012 for a pT1c, N0, stage IA invasive ductal carcinoma, grade 2. Tumor was ER positive at 100%, PR +80%, HER-2/neu negative, with an MIB-1 of 10%.  Oncotype score was 27, predicting a rate of distant recurrence within 10 years of 18% the patient took tamoxifen for 5 years. Tamoxifen was started in April of 2014.  Subsequent history is as detailed below.   INTERVAL HISTORY: Catherine Atkins returns today for follow up of her left breast carcinoma. She has been on tamoxifen since April 2014 and is tolerating it well. She has some mild hot flashes, but denies vaginal changes. She is not exercising regularly but knows she should. The interval history is unremarkable. Her large left seroma is unchanged. It is still firm, and she only has infrequent pain to this area.   REVIEW OF SYSTEMS: Catherine Atkins denies fevers, chills ,nausea, vomiting, or changes in bowel or bladder habits. She has no shortness of breath, chest pain ,cough, or palpitations. Her energy level is fair during the day, and she sleeps about the same at night. Her appetite is healthy. She denies headaches, dizziness, unexplained weight loss, bruising, or bleeding. A detailed review of systems is otherwise stable.   Catherine Atkins  PAST MEDICAL HISTORY: Past Medical History  Diagnosis Date  . Depression   . Hypertension   . Cancer     lt breast  . Cancer of left breast 12/07/2011    Mammographically detectged, Dx by [facility] as IDC, Receptor+, Her 2 Negative  . Allergy     codeine  . Blood transfusion     years ago/     PAST SURGICAL HISTORY: Past Surgical History  Procedure Laterality Date  . Abdominal hysterectomy  1989    b/l salpingoo=oppherecty  . Breast biopsy  12/08/11    Left breast 9 o'clock  bx=invasive mammary ca,ER/PR=+,IDC Her2 neg  . Colonoscopy    . Appendectomy    . Mastectomy  01/25/12    let mastectomy    FAMILY HISTORY (Updated 01/30/2014) Family History  Problem Relation Age of Onset  . Cancer Mother     breast  . Cancer Sister     breast  . Leukemia Father   . Heart attack Brother   . Prostate cancer Maternal Uncle     GYNECOLOGIC HISTORY:  menarche at 66, GX P0, hysterectomy/BSO at age 54, was on HRT w/ premarin for ~ 15 years.  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 He has 3 children from a prior marriage, none in this area. The patient is not a church attender.   ADVANCED DIRECTIVES: In place   HEALTH MAINTENANCE: (updated 01/30/2014)  History  Substance Use Topics  . Smoking status: Never Smoker   . Smokeless tobacco: Never Used  . Alcohol Use: No    Mammogram: 01/24/2014  Colonoscopy: Due in 2015 (GI is in [place]), patient says will schedule soon  PAP: Status post hysterectomy; has pelvic exams with Dr. Willey Blade,  UTD  Bone density: 01/24/2014 normal  Lipid panel: UTD, Dr. Willey Blade   Allergies  Allergen Reactions  . Codeine Itching    Current Outpatient Prescriptions  Medication Sig Dispense Refill  . acetaminophen (TYLENOL) 500 MG tablet Take 500 mg by mouth every 6 (six) hours as needed. Take 2 $Remo'500mg'OsHIi$  po prn headacjhes     . amitriptyline (ELAVIL) 25 MG tablet Take 25 mg by mouth at bedtime.    Marland Kitchen amLODipine-benazepril (LOTREL) 5-20 MG per capsule Take 1 capsule by mouth daily.    Marland Kitchen atenolol (TENORMIN) 100 MG tablet Take 100 mg by mouth daily.    Marland Kitchen buPROPion (WELLBUTRIN XL) 300 MG 24 hr tablet Take 300 mg by mouth daily.     . cloNIDine (CATAPRES) 0.1 MG tablet Take 0.1 mg by mouth 2 (two) times daily.    . tamoxifen (NOLVADEX) 20 MG tablet Take 1 tablet (20 mg total) by mouth daily. 30 tablet 5  . triamterene-hydrochlorothiazide (MAXZIDE) 75-50 MG per tablet Take 1 tablet by mouth daily.     No current facility-administered medications for this visit.    OBJECTIVE:  Middle-aged white female who appears comfortable and is in no acute distress. Filed Vitals:   07/31/14 1430  BP: 116/51  Pulse: 70  Temp: 98.8 F (37.1 C)  Resp: 18     Body mass index is 33.02 kg/(m^2).    ECOG FS: 0 Filed Weights   07/31/14 1430  Weight: 230 lb 1.6 oz (104.373 kg)   Physical Exam:  Skin: warm, dry  HEENT: sclerae anicteric, conjunctivae pink, oropharynx clear. No thrush or mucositis.  Lymph Nodes: No cervical or supraclavicular lymphadenopathy  Lungs: clear to auscultation bilaterally, no rales, wheezes, or rhonci  Heart: regular rate and rhythm  Abdomen: round, soft, non tender, positive bowel sounds  Musculoskeletal: No focal spinal tenderness, no peripheral edema  Neuro: non focal, well oriented, positive affect  Breast: right breast unremarkable. Left breast status post mastectomy with large, firm seroma that almost takes the shape of an implant. Faint bruising at incision line. Left axilla benign.    LAB RESULTS:   Lab Results  Component Value Date   WBC 8.5 07/31/2014   NEUTROABS 4.4 07/31/2014   HGB 12.0 07/31/2014   HCT 35.7 07/31/2014   MCV 94.2 07/31/2014   PLT 257 07/31/2014      Chemistry      Component Value Date/Time   NA 142 07/31/2014 1414   NA 139 01/23/2012 0915   K 3.8 07/31/2014 1414   K 4.6 01/23/2012 0915   CL 107 11/27/2012 0830   CL 102 01/23/2012 0915   CO2 25 07/31/2014 1414   CO2 26 01/23/2012 0915   BUN 19.9 07/31/2014 1414   BUN 15 01/23/2012 0915   CREATININE 1.6* 07/31/2014 1414   CREATININE 1.16* 01/23/2012 0915      Component Value Date/Time   CALCIUM 9.1  07/31/2014 1414   CALCIUM 9.7 01/23/2012 0915   ALKPHOS 45 07/31/2014 1414   ALKPHOS 64 01/23/2012 0915   AST 26 07/31/2014 1414   AST 21 01/23/2012 0915   ALT 19 07/31/2014 1414   ALT 19 01/23/2012 0915   BILITOT 0.28 07/31/2014 1414   BILITOT 0.2* 01/23/2012 0915       STUDIES:  Most recent mammogram on 01/28/14 was unremarkable.  Most recent bone density scan on 01/24/14 showed a t-score of 1.7 (normal)  ASSESSMENT: 64 y.o. [place]   (1) S/p mastectomy on 01/25/2012 for mpT1c,  stage N0, stage IA invasive ductal carcinoma, grade 2, estrogen receptor 100% positive, progesterone receptor 80% positive, with no HER-2 amplification, and an MIB-1 of 10%.  (2) Oncotype score of 27 predicts a rate of distant recurrence within 10 years of 18% if the patient takes tamoxifen for 5 years   (3) tamoxifen started April 2014   (4) s/p remote hysterectomy/BSO  PLAN: Catherine Atkins looks and feels well today. The labs were reviewed in detail and were stable. She is tolerating the tamoxifen well an has plans to continue it for at least 5 years of antiestrogen therapy.  She has a follow up visit with Dr. Zella Richer coming up in the next few weeks. At this time her seroma is unchanged as mentioned before.   Catherine Atkins will return in 6 months for labs and a follow up visit. She will be due for a mammogram that same month. She understands and agrees with this plan. She knows the goal of treatment in her case is cure. She has been encouraged to call with any issues that might arise before her next visit here.  Catherine Atkins, [place] [phone] 07/31/2014 2:57 PM

## 2014-08-01 ENCOUNTER — Other Ambulatory Visit: Payer: Self-pay | Admitting: Oncology

## 2014-12-24 ENCOUNTER — Other Ambulatory Visit: Payer: Self-pay

## 2014-12-24 DIAGNOSIS — Z1231 Encounter for screening mammogram for malignant neoplasm of breast: Secondary | ICD-10-CM

## 2014-12-26 ENCOUNTER — Telehealth: Payer: Self-pay | Admitting: Nurse Practitioner

## 2014-12-26 NOTE — Telephone Encounter (Signed)
Lft msg for pt confirming labs/ov per provider's schedule, mailed out schedule... KJ

## 2015-01-21 ENCOUNTER — Encounter: Payer: Self-pay | Admitting: Internal Medicine

## 2015-01-23 ENCOUNTER — Other Ambulatory Visit: Payer: Self-pay | Admitting: *Deleted

## 2015-01-23 DIAGNOSIS — C50419 Malignant neoplasm of upper-outer quadrant of unspecified female breast: Secondary | ICD-10-CM

## 2015-01-23 MED ORDER — TAMOXIFEN CITRATE 20 MG PO TABS: 20.0000 mg | ORAL_TABLET | Freq: Every day | ORAL | Status: DC

## 2015-01-26 ENCOUNTER — Other Ambulatory Visit: Payer: Self-pay | Admitting: *Deleted

## 2015-01-26 DIAGNOSIS — C50419 Malignant neoplasm of upper-outer quadrant of unspecified female breast: Secondary | ICD-10-CM

## 2015-01-26 MED ORDER — TAMOXIFEN CITRATE 20 MG PO TABS: 20.0000 mg | ORAL_TABLET | Freq: Every day | ORAL | Status: DC

## 2015-01-28 ENCOUNTER — Ambulatory Visit
Admission: RE | Admit: 2015-01-28 | Discharge: 2015-01-28 | Disposition: A | Discharge status: Home / Self Care | Payer: PRIVATE HEALTH INSURANCE | Source: Ambulatory Visit

## 2015-01-28 DIAGNOSIS — Z1231 Encounter for screening mammogram for malignant neoplasm of breast: Secondary | ICD-10-CM

## 2015-01-29 ENCOUNTER — Other Ambulatory Visit: Payer: Self-pay | Admitting: Nurse Practitioner

## 2015-01-29 DIAGNOSIS — C50412 Malignant neoplasm of upper-outer quadrant of left female breast: Secondary | ICD-10-CM

## 2015-01-30 ENCOUNTER — Other Ambulatory Visit (HOSPITAL_BASED_OUTPATIENT_CLINIC_OR_DEPARTMENT_OTHER): Payer: PRIVATE HEALTH INSURANCE

## 2015-01-30 ENCOUNTER — Other Ambulatory Visit: Payer: PRIVATE HEALTH INSURANCE

## 2015-01-30 ENCOUNTER — Telehealth: Payer: Self-pay | Admitting: Oncology

## 2015-01-30 ENCOUNTER — Ambulatory Visit (HOSPITAL_BASED_OUTPATIENT_CLINIC_OR_DEPARTMENT_OTHER): Payer: PRIVATE HEALTH INSURANCE | Admitting: Nurse Practitioner

## 2015-01-30 ENCOUNTER — Ambulatory Visit: Payer: PRIVATE HEALTH INSURANCE | Admitting: Nurse Practitioner

## 2015-01-30 ENCOUNTER — Encounter: Payer: Self-pay | Admitting: Nurse Practitioner

## 2015-01-30 VITALS — BP 100/63 | HR 66 | Temp 98.1°F | Resp 18 | Ht 70.0 in | Wt 230.4 lb

## 2015-01-30 DIAGNOSIS — C50912 Malignant neoplasm of unspecified site of left female breast: Secondary | ICD-10-CM

## 2015-01-30 DIAGNOSIS — Z170 Estrogen receptor positive status [ER+]: Secondary | ICD-10-CM

## 2015-01-30 DIAGNOSIS — T888XXD Other specified complications of surgical and medical care, not elsewhere classified, subsequent encounter: Secondary | ICD-10-CM | POA: Diagnosis not present

## 2015-01-30 DIAGNOSIS — Z79810 Long term (current) use of selective estrogen receptor modulators (SERMs): Secondary | ICD-10-CM

## 2015-01-30 DIAGNOSIS — C50812 Malignant neoplasm of overlapping sites of left female breast: Secondary | ICD-10-CM | POA: Diagnosis not present

## 2015-01-30 DIAGNOSIS — T792XXD Traumatic secondary and recurrent hemorrhage and seroma, subsequent encounter: Secondary | ICD-10-CM

## 2015-01-30 DIAGNOSIS — C50412 Malignant neoplasm of upper-outer quadrant of left female breast: Secondary | ICD-10-CM

## 2015-01-30 LAB — CBC WITH DIFFERENTIAL/PLATELET
BASO%: 1.7 % (ref 0.0–2.0)
BASOS ABS: 0.1 10*3/uL (ref 0.0–0.1)
EOS%: 2.3 % (ref 0.0–7.0)
Eosinophils Absolute: 0.2 10*3/uL (ref 0.0–0.5)
HEMATOCRIT: 35.5 % (ref 34.8–46.6)
HGB: 11.9 g/dL (ref 11.6–15.9)
LYMPH#: 2.5 10*3/uL (ref 0.9–3.3)
LYMPH%: 38.1 % (ref 14.0–49.7)
MCH: 31.5 pg (ref 25.1–34.0)
MCHC: 33.6 g/dL (ref 31.5–36.0)
MCV: 93.9 fL (ref 79.5–101.0)
MONO#: 0.4 10*3/uL (ref 0.1–0.9)
MONO%: 6.8 % (ref 0.0–14.0)
NEUT#: 3.3 10*3/uL (ref 1.5–6.5)
NEUT%: 51.1 % (ref 38.4–76.8)
PLATELETS: 226 10*3/uL (ref 145–400)
RBC: 3.78 10*6/uL (ref 3.70–5.45)
RDW: 13.4 % (ref 11.2–14.5)
WBC: 6.5 10*3/uL (ref 3.9–10.3)

## 2015-01-30 LAB — COMPREHENSIVE METABOLIC PANEL (CC13)
ALT: 20 U/L (ref 0–55)
ANION GAP: 7 meq/L (ref 3–11)
AST: 27 U/L (ref 5–34)
Albumin: 3.4 g/dL — ABNORMAL LOW (ref 3.5–5.0)
Alkaline Phosphatase: 41 U/L (ref 40–150)
BUN: 16.3 mg/dL (ref 7.0–26.0)
CALCIUM: 9.1 mg/dL (ref 8.4–10.4)
CHLORIDE: 110 meq/L — AB (ref 98–109)
CO2: 27 mEq/L (ref 22–29)
Creatinine: 1.5 mg/dL — ABNORMAL HIGH (ref 0.6–1.1)
EGFR: 37 mL/min/{1.73_m2} — ABNORMAL LOW (ref >90)
Glucose: 106 mg/dl (ref 70–140)
POTASSIUM: 3.7 meq/L (ref 3.5–5.1)
Sodium: 143 mEq/L (ref 136–145)
Total Bilirubin: 0.26 mg/dL (ref 0.20–1.20)
Total Protein: 6.5 g/dL (ref 6.4–8.3)

## 2015-01-30 NOTE — Progress Notes (Signed)
[place]  [phone] [phone]     ID: [name] OB: 20-Mar-1951  MR#: [id number]  WLN#:[id number]  PCP: Asencion Noble, MD GYN:   SU:  Dr. Zella Richer, MD OTHER MD: Kyung Rudd, MD  CHIEF COMPLAINT: Hx of Left Breast Cancer   CURRENT TREATMENT: tamoxifen daily  BREAST CANCER HISTORY Catherine Atkins was diagnosed with a left breast cancer in 2013, and underwent a left mastectomy on 01/25/2012 for a pT1c, N0, stage IA invasive ductal carcinoma, grade 2. Tumor was ER positive at 100%, PR +80%, HER-2/neu negative, with an MIB-1 of 10%.  Oncotype score was 27, predicting a rate of distant recurrence within 10 years of 18% the patient took tamoxifen for 5 years. Tamoxifen was started in April of 2014.  Subsequent history is as detailed below.   INTERVAL HISTORY: Catherine Atkins returns today for follow up of her left breast carcinoma. She has been on tamoxifen since April 2014 and continues to tolerate this well. She has some mild hot flashes, but denies vaginal changes. The interval history is generally unremarkable for any health related changes. She has recently started a walking program.  REVIEW OF SYSTEMS: Catherine Atkins denies fevers, chills ,nausea, vomiting, or changes in bowel or bladder habits. She has no shortness of breath, chest pain ,cough, or palpitations. Her energy level is fair during the day, and she sleeps about the same at night. Her appetite is healthy. She has occasional headaches, but this is managed with excedrin. They are no worse or any more frequent than before. A detailed review of systems is otherwise stable.   PAST MEDICAL HISTORY: Past Medical History  Diagnosis Date  . Depression   . Hypertension   . Cancer     lt breast  . Cancer of left breast 12/07/2011    Mammographically detectged, Dx by [facility] as IDC, Receptor+, Her 2 Negative  . Allergy     codeine  . Blood transfusion     years ago/    PAST SURGICAL HISTORY: Past  Surgical History  Procedure Laterality Date  . Abdominal hysterectomy  1989    b/l salpingoo=oppherecty  . Breast biopsy  12/08/11    Left breast 9 o'clock  bx=invasive mammary ca,ER/PR=+,IDC Her2 neg  . Colonoscopy    . Appendectomy    . Mastectomy  01/25/12    let mastectomy    FAMILY HISTORY (Updated 01/30/2014) Family History  Problem Relation Age of Onset  . Cancer Mother     breast  . Cancer Sister     breast  . Leukemia Father   . Heart attack Brother   . Prostate cancer Maternal Uncle     GYNECOLOGIC HISTORY:  menarche at 32, GX P0, hysterectomy/BSO at age 72, was on HRT w/ premarin for ~ 15 years.  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 He has 3 children from a prior marriage, none in this area. The patient is not a church attender.   ADVANCED DIRECTIVES: In place   HEALTH MAINTENANCE: (updated 01/30/2014)  Social History  Substance Use Topics  . Smoking status: Never Smoker   . Smokeless tobacco: Never Used  . Alcohol Use: No    Mammogram: 01/24/2014  Colonoscopy: Due in 2015 (GI is in [place]), patient says will schedule soon  PAP: Status post hysterectomy; has pelvic exams with Dr. Willey Blade,  UTD  Bone density: 01/24/2014 normal  Lipid panel: UTD, Dr. Willey Blade   Allergies  Allergen Reactions  . Codeine Itching    Current Outpatient Prescriptions  Medication Sig Dispense Refill  . acetaminophen (TYLENOL) 500 MG tablet Take 500 mg by mouth every 6 (six) hours as needed. Take 2 523m po prn headacjhes     . amLODipine-benazepril (LOTREL) 5-20 MG per capsule Take 1 capsule by mouth daily.    .Marland Kitchenatenolol (TENORMIN) 100 MG tablet Take 100 mg by mouth daily.    .Marland KitchenbuPROPion (WELLBUTRIN XL) 300 MG 24 hr tablet Take 300 mg by mouth daily.    . cloNIDine (CATAPRES) 0.1 MG tablet Take 0.1 mg by mouth 2 (two) times daily.    . tamoxifen  (NOLVADEX) 20 MG tablet Take 1 tablet (20 mg total) by mouth daily. 30 tablet 5  . triamterene-hydrochlorothiazide (MAXZIDE) 75-50 MG per tablet Take 1 tablet by mouth daily.     No current facility-administered medications for this visit.    OBJECTIVE:  Middle-aged white female who appears comfortable and is in no acute distress. Filed Vitals:   01/30/15 1114  BP: 100/63  Pulse: 66  Temp: 98.1 F (36.7 C)  Resp: 18     Body mass index is 33.06 kg/(m^2).    ECOG FS: 0 Filed Weights   01/30/15 1114  Weight: 230 lb 6.4 oz (104.509 kg)   Physical Exam:  Skin: warm, dry  HEENT: sclerae anicteric, conjunctivae pink, oropharynx clear. No thrush or mucositis.  Lymph Nodes: No cervical or supraclavicular lymphadenopathy  Lungs: clear to auscultation bilaterally, no rales, wheezes, or rhonci  Heart: regular rate and rhythm  Abdomen: round, soft, non tender, positive bowel sounds  Musculoskeletal: No focal spinal tenderness, no peripheral edema  Neuro: non focal, well oriented, positive affect  Breast: right breast unremarkable. Left breast status post mastectomy with large, firm seroma that almost takes the shape of an implant. Faint bruising at incision line. Left axilla benign.    LAB RESULTS:   Lab Results  Component Value Date   WBC 6.5 01/30/2015   NEUTROABS 3.3 01/30/2015   HGB 11.9 01/30/2015   HCT 35.5 01/30/2015   MCV 93.9 01/30/2015   PLT 226 01/30/2015      Chemistry      Component Value Date/Time   NA 142 07/31/2014 1414   NA 139 01/23/2012 0915   K 3.8 07/31/2014 1414   K 4.6 01/23/2012 0915   CL 107 11/27/2012 0830   CL 102 01/23/2012 0915   CO2 25 07/31/2014 1414   CO2 26 01/23/2012 0915   BUN 19.9 07/31/2014 1414   BUN 15 01/23/2012 0915   CREATININE 1.6* 07/31/2014 1414   CREATININE 1.16* 01/23/2012 0915      Component Value Date/Time   CALCIUM 9.1 07/31/2014 1414   CALCIUM 9.7 01/23/2012 0915   ALKPHOS 45 07/31/2014 1414   ALKPHOS 64  01/23/2012 0915   AST 26 07/31/2014 1414   AST 21 01/23/2012 0915   ALT 19 07/31/2014 1414   ALT 19 01/23/2012 0915   BILITOT 0.28 07/31/2014 1414   BILITOT 0.2* 01/23/2012 0915       STUDIES:  Mm Digital Screening Unilat R  01/28/2015   CLINICAL DATA:  Screening.  EXAM: DIGITAL SCREENING UNILATERAL RIGHT MAMMOGRAM WITH CAD  COMPARISON:  Previous exam(s).  ACR Breast Density Category c: The breast tissue is heterogeneously dense, which may obscure small masses.  FINDINGS: The patient has had a left mastectomy. There are no findings suspicious for malignancy.  IMPRESSION: No mammographic evidence of malignancy. A result letter of this screening mammogram will be mailed directly to the patient.  RECOMMENDATION: Screening mammogram in one year.  ([id number])  BI-RADS CATEGORY  1: Negative.   Electronically Signed   By: Nolon Nations M.D.   On: 01/28/2015 15:06    ASSESSMENT: 64 y.o. [place] woman   (1) S/p mastectomy on 01/25/2012 for mpT1c, stage N0, stage IA invasive ductal carcinoma, grade 2, estrogen receptor 100% positive, progesterone receptor 80% positive, with no HER-2 amplification, and an MIB-1 of 10%.  (2) Oncotype score of 27 predicts a rate of distant recurrence within 10 years of 18% if the patient takes tamoxifen for 5 years   (3) tamoxifen started April 2014   (4) s/p remote hysterectomy/BSO  PLAN: Catherine Atkins is doing well as far as her breast cancer is concerned. She is now 3 years out since her definitive surgery with no evidence of recurrent disease. The labs were reviewed in detail and were stable. She is tolerating the tamoxifen well and will continue this drug for 5-10 years of antiestrogen therapy.   She will follow up with Dr. Zella Richer regarding her left breast seroma. He may decide to aspirate at this visit.   Catherine Atkins will return in 1 year for labs and a follow up visit. She will be due for a mammogram that same month. She understands and agrees with  this plan. She knows the goal of treatment in her case is cure. She has been encouraged to call with any issues that might arise before her next visit here.  Catherine Panda, NP 01/30/2015 11:31 AM

## 2015-01-30 NOTE — Telephone Encounter (Signed)
Appointments made and avs printed for patient °

## 2015-02-18 ENCOUNTER — Other Ambulatory Visit: Payer: Self-pay | Admitting: General Surgery

## 2015-02-18 DIAGNOSIS — C50912 Malignant neoplasm of unspecified site of left female breast: Secondary | ICD-10-CM

## 2015-02-19 ENCOUNTER — Telehealth: Payer: Self-pay | Admitting: Nurse Practitioner

## 2015-02-19 NOTE — Telephone Encounter (Signed)
No answer to confirm appointment for Survivorship in February. Mailed calendar.

## 2015-03-18 ENCOUNTER — Ambulatory Visit (AMBULATORY_SURGERY_CENTER): Payer: Self-pay | Admitting: *Deleted

## 2015-03-18 VITALS — Ht 70.0 in | Wt 230.0 lb

## 2015-03-18 DIAGNOSIS — Z1211 Encounter for screening for malignant neoplasm of colon: Secondary | ICD-10-CM

## 2015-03-18 MED ORDER — NA SULFATE-K SULFATE-MG SULF 17.5-3.13-1.6 GM/177ML PO SOLN: ORAL | Status: DC

## 2015-03-18 NOTE — Progress Notes (Signed)
Patient denies any allergies to eggs or soy. Patient denies any problems with anesthesia/sedation. Patient denies any oxygen use at home and does not take any diet/weight loss medications. EMMI education assisgned to patient on colonoscopy, this was explained and instructions given to patient. 

## 2015-03-30 ENCOUNTER — Telehealth: Payer: Self-pay | Admitting: Internal Medicine

## 2015-03-30 NOTE — Telephone Encounter (Signed)
No

## 2015-04-01 ENCOUNTER — Encounter: Payer: PRIVATE HEALTH INSURANCE | Admitting: Internal Medicine

## 2015-07-22 ENCOUNTER — Telehealth: Payer: Self-pay | Admitting: Nurse Practitioner

## 2015-07-22 ENCOUNTER — Other Ambulatory Visit: Payer: Self-pay | Admitting: Nurse Practitioner

## 2015-07-22 ENCOUNTER — Encounter: Payer: Self-pay | Admitting: Nurse Practitioner

## 2015-07-22 ENCOUNTER — Ambulatory Visit (HOSPITAL_BASED_OUTPATIENT_CLINIC_OR_DEPARTMENT_OTHER): Payer: PRIVATE HEALTH INSURANCE | Admitting: Nurse Practitioner

## 2015-07-22 VITALS — BP 113/71 | HR 80 | Temp 98.6°F | Resp 18 | Ht 70.0 in | Wt 216.3 lb

## 2015-07-22 DIAGNOSIS — C50912 Malignant neoplasm of unspecified site of left female breast: Secondary | ICD-10-CM | POA: Diagnosis not present

## 2015-07-22 DIAGNOSIS — F4321 Adjustment disorder with depressed mood: Secondary | ICD-10-CM

## 2015-07-22 DIAGNOSIS — T888XXD Other specified complications of surgical and medical care, not elsewhere classified, subsequent encounter: Secondary | ICD-10-CM

## 2015-07-22 DIAGNOSIS — T792XXD Traumatic secondary and recurrent hemorrhage and seroma, subsequent encounter: Secondary | ICD-10-CM

## 2015-07-22 DIAGNOSIS — Z170 Estrogen receptor positive status [ER+]: Secondary | ICD-10-CM | POA: Diagnosis not present

## 2015-07-22 DIAGNOSIS — N6489 Other specified disorders of breast: Secondary | ICD-10-CM

## 2015-07-22 NOTE — Progress Notes (Signed)
CLINIC:  Cancer Survivorship   REASON FOR VISIT:  Routine follow-up post-treatment for history of breast cancer.  BRIEF ONCOLOGIC HISTORY:    Cancer of left breast ([place])   01/25/2012 Definitive Surgery Left mastectomy: invasive ductal carcinoma, grade 2, ER+ (100%), PR+ (80%), HER2/neu negative, Ki67 10%   01/25/2012 Pathologic Stage Stage IA: mpT1c N0   01/25/2012 Oncotype testing RS 27 (18% ROR)   09/2012 -  Anti-estrogen oral therapy Tamoxifen 20 mg daily    INTERVAL HISTORY:  Ms. Deutscher presents to the [facility] today for ongoing follow up regarding her history of breast cancer. Ms. Offenberger is grieving the loss of her husband who passed away in May 25, 2015. She has lost 14 pounds since her last visit with Gentry Fitz NP in August 2016.  She reports that the weight loss began when her husband was ill and continued after his passing as she just "stopped eating."  She has recently begun to eat more and gain some of the weight back, but is interested in taking steps to keep the weight off and potentially lose more in order "to feel better."  She denies feelings of depression and continues on her bupropion as prescribed by Dr. Willey Blade.  She has no homicidal or suicidal ideation.  She is not exercising regularly.  She continues with swelling along her left reconstructed breast related to a seroma and states that she is now interested in having it evaluated and potentially drained.  She denies any other change along her left reconstructed breast or any mass in her right breast.  She denies any headache, cough, or bone pain. She denies dyspnea but states that she has noted some dizziness upon standing, which has led Dr. Willey Blade to adjust her antihypertensives.  She continues on the tamoxifen and denies any significant hot flashes nor any vaginal bleeding.     REVIEW OF SYSTEMS:  General: Weight loss as above. Denies fever, chills, or generalized fatigue.  HEENT: Wears glasses. Denies  visual changes, hearing loss, mouth sores, or difficulty swallowing. Cardiac: Dizziness upon standing. Denies palpitations and lower extremity edema.  Respiratory: Denies wheeze or dyspnea on exertion.  Breast: Seroma in left reconstructed breast, as above, otherwise, denies any new nodularity, masses, tenderness, nipple changes, or nipple discharge bilaterally.  GI: Denies abdominal pain, constipation, diarrhea, nausea, or vomiting.  GU: Denies dysuria, hematuria, vaginal bleeding, vaginal discharge, or vaginal dryness.  Musculoskeletal: Denies joint or bone pain.  Neuro: Denies recent fall or numbness / tingling in her extremities.  Skin: Denies rash, pruritis, or open wounds.  Psych: Grieving with some insomnia.    A 14-point review of systems was completed and was negative, except as noted above.   ONCOLOGY TREATMENT TEAM:  1. Surgeon:  Dr. Zella Richer at [facility]  2. Medical Oncologist: Dr. Jana Hakim     PAST MEDICAL/SURGICAL HISTORY:  Past Medical History  Diagnosis Date  . Depression   . Hypertension   . Cancer [facility])     lt breast  . Cancer of left breast ([place]) 12/07/2011    Mammographically detectged, Dx by [facility] as IDC, Receptor+, Her 2 Negative  . Allergy     codeine  . Blood transfusion     years ago/   Past Surgical History  Procedure Laterality Date  . Abdominal hysterectomy  1989    b/l salpingoo=oppherecty  . Breast biopsy  12/08/11    Left breast 9 o'clock  bx=invasive mammary ca,ER/PR=+,IDC Her2 neg  . Colonoscopy    .  Appendectomy    . Mastectomy  01/25/12    let mastectomy     ALLERGIES:  Allergies  Allergen Reactions  . Codeine Itching     CURRENT MEDICATIONS:  Current Outpatient Prescriptions on File Prior to Visit  Medication Sig Dispense Refill  . amLODipine-benazepril (LOTREL) 5-20 MG per capsule Take 1 capsule by mouth daily.    Marland Kitchen aspirin 81 MG tablet Take 81 mg by mouth daily.    Marland Kitchen aspirin-acetaminophen-caffeine (EXCEDRIN  MIGRAINE) 250-250-65 MG tablet Take 1 tablet by mouth every 6 (six) hours as needed for headache.    Marland Kitchen atenolol (TENORMIN) 100 MG tablet Take 100 mg by mouth daily.    Marland Kitchen buPROPion (WELLBUTRIN XL) 300 MG 24 hr tablet Take 300 mg by mouth daily.    . Na Sulfate-K Sulfate-Mg Sulf SOLN (no substitutions)-TAKE AS DIRECTED. 354 mL 0  . tamoxifen (NOLVADEX) 20 MG tablet Take 1 tablet (20 mg total) by mouth daily. 30 tablet 5  . acetaminophen (TYLENOL) 500 MG tablet Take 500 mg by mouth every 6 (six) hours as needed. Reported on 07/22/2015     No current facility-administered medications on file prior to visit.     ONCOLOGIC FAMILY HISTORY:  Family History  Problem Relation Age of Onset  . Cancer Mother     breast  . Cancer Sister     breast  . Leukemia Father   . Heart attack Brother   . Prostate cancer Maternal Uncle   . Colon cancer Paternal Uncle 62     GENETIC COUNSELING/TESTING: No   SOCIAL HISTORY:  Ainsley Deakins is widowed and lives alone in [place], [place].  She has 3 children from a prior marriage. 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  She denies any current or history of tobacco, alcohol, or illicit drug use.     PHYSICAL EXAMINATION:  Vital Signs: Filed Vitals:   07/22/15 0832  BP: 113/71  Pulse: 80  Temp: 98.6 F (37 C)  Resp: 18   ECOG performance status: 0 General: Well-nourished, well-appearing female in no acute distress.  She is  unaccompanied in clinic today.   HEENT: Head is atraumatic and normocephalic.  Pupils equal and reactive to light and accomodation. Conjunctivae clear without exudate.  Sclerae anicteric. Oral mucosa is pink, moist, and intact without lesions.  Oropharynx is pink without lesions or erythema.  Lymph: No cervical, supraclavicular, infraclavicular, or axillary lymphadenopathy noted on palpation.  Cardiovascular: Regular rate and rhythm without murmurs, rubs, or  gallops. Respiratory: Clear to auscultation bilaterally. Chest expansion symmetric without accessory muscle use on inspiration or expiration.  Breast: Bilateral breast exam performed.  Seroma along left reconstructed breast, firm, slightly tender.  No mass or lesion throughout remainder of reconstructed left breast nor right breast. GI: Abdomen soft and round. No tenderness to palpation. Bowel sounds normoactive in 4 quadrants. No hepatosplenomegaly.   GU: Deferred.   Neuro: No focal deficits. Steady gait.  Psych: Mood and affect normal and appropriate for situation.  Extremities: No edema, cyanosis, or clubbing.  Skin: Warm and dry. No open lesions noted.   LABORATORY DATA:  No results found for this or any previous visit (from the past 2160 hour(s)).  DIAGNOSTIC IMAGING: Screening mammogram performed 01/28/2016 reveals breast density category c.  No mass or lesion in right breast.  Recommended follow up in one year.     ASSESSMENT AND PLAN:   1. History of breast cancer: Stage  IA invasive ductal carcinoma of the left breast (01/2012), grade 2, ER positive, PR positive, HER2/neu negative, S/P mastectomy with reconstruction (01/2012) with continued seroma (see note below) now on adjuvant endocrine therapy with tamoxifen begun 09/2012.  Ms. Patalano is doing well with no clinical symptoms worrisome for cancer recurrence at this time. I have reviewed the recommendations for ongoing surveillance with her and she will follow-up with her medical oncologist,  Dr. Jana Hakim, in August 2017 with history and physical exam per surveillance protocol. She will be due right mammogram before that time and we will schedule it today. She will continue her anti-estrogen therapy with tamoxifen as prescribed by  Dr. Jana Hakim and is due to complete 5 years of therapy in 2019. She was instructed to make Dr. Jana Hakim or myself aware if she notes any change within her breast, any new symptoms such as pain, shortness of  breath, weight loss, or fatigue.  She will also report any new or increased side effects of the endocrine therapy or any difficulties with it.   Though the incidence is low, there is an associated risk of endometrial cancer with anti-estrogen therapies like Tamoxifen.  Ms. Sigler was encouraged to contact Dr. Jana Hakim or myself with any vaginal bleeding while taking Tamoxifen. Other side effects of Tamoxifen were again reviewed with her as well.  2. Seroma: Ms. Frysinger continues with a seroma along her left reconstructed breast.  In the past, she has discussed with Dr. Zella Richer the possibility of evalating this with an ultrasound to determine its extent with referral to plastics to discuss management (aspiration vs. surgical drainage with drain placement).  She had been reluctant to pursue this in the past but is now ready and we will make arrangements at this time for diagnostic ultrasound of her left breast and referral to Dr. Iran Planas or [place] for their recommendations regarding management.  3. Grief: Ms. Cadogan is appropriately grieving the loss of her husband who passed away three months ago.  She is on bupropion, which is controlling her depressive symptoms, however, I think would benefit from grief counseling.  I have shared with her the name and contact information for the [facility] [place] who provides free grief counseling.  I believe this would be of great benefit to her and have encouraged her to reach out to them.  She will also continue to follow up with Dr. Willey Blade to manage her depression and report if it worsens.  She was offered support today through active listening and expressive supportive counseling.    4. Cancer screening:  Due to Ms. Halderman's history and her age, she should receive screening for skin cancers, colon cancer, and gynecologic cancers.  The information and recommendations were shared with the patient and in her written after visit  summary.  5. Health maintenance and wellness promotion: Ms. Hottenstein has lost ~20 pounds with the illness / death of her husband.  She is interested in making changes to help keep the weight off that she has lost as well as healthily losing more.  We discussed recommendations to maximize nutrition / lose weight and minimize recurrence, such as increased intake of fruits, vegetables, lean proteins, and minimizing the intake of red meats and processed foods.  She was also encouraged to engage in moderate to vigorous exercise for 30 minutes per day most days of the week. We discussed the [employer] fitness program, which is designed for cancer survivors to help them become more physically fit after  cancer treatments. I have given her the name and contact information for Elaina Hoops for her to reach out to learn more / enroll.      A total 35 minutes of face-to-face time was spent with this patient with greater than 50% of that time in counseling and care-coordination.   Sylvan Cheese, NP  Survivorship Program [facility] [phone]   Note: [place] Asencion [place], [place] [phone]

## 2015-07-22 NOTE — Telephone Encounter (Signed)
per pof to sch mamma-pt req US-sent heahter M a email to req order be put in-sch referral to Dr Lynder Parents pt appt time /date/location-willc all pt after reply

## 2015-07-22 NOTE — Patient Instructions (Signed)
Thank you for coming in today!  I enjoyed meeting you and am looking forward to working with you. As we discussed, I will put in the order for an ultrasound of your left breast to evaluate the fluid.  I'll also enter the referral to see Dr. Marla Roe or Iran Planas (the plastic surgeons) so that we can get their thoughts on the best way to remove that fluid.  I will let Dr. Zella Richer know that we are doing these things, too.  Please reach out to the [facility] about their grief counseling.  I think you will find this very helpful.  I also want you to reach out to the [employer] program about the exercise program they offer through the [facility].  This can also be very helpful for you as you work on your goals.  Please continue to perform your self breast exam and let us know if you have any changes.  I've included some information on what to look for below.  Your next follow up appointment will be with Gentry Fitz in August 2017 (following your mammogram in August 2017).    Symptoms to Watch for and Report to Your Provider  . Return of the cancer symptoms you had before - such as a lump or change in the area of your incision (or changes in reconstructed breast, if reconstruction completed) . New or unusual pain that seems unrelated to an injury and does not go away, including back pain or bone pain . Weight loss without trying/intending . Unexplained bleeding . A rash or allergic reaction, such as swelling, severe itching or wheezing . Chills or fevers . Persistent headaches . Shortness of breath or difficulty breathing . Bloody stools or blood in your urine . Nausea, vomiting, diarrhea, loss of appetite, or trouble swallowing . A cough that does not go away . Abdominal pain . Swelling in your arms or legs . Fractures . Hot flashes or other menopausal symptoms . Any other signs mentioned by your doctor or nurse or any unusual symptoms                 that you  just can't explain   NOTE: Just because you have certain symptoms, it doesn't mean the cancer has come back or you have a new cancer. Symptoms can be due to other problems that need to be addressed.  It is important to watch for these symptoms and report them to your provider so you can be medically evaluated for any of these concerns!    Living a Life of Wellness After Cancer:  *Note: Please consult your health care provider before using any medications, supplements, over-the-counter products, or other interventions.  Also, please consult your primary care provider before you begin any lifestyle program (diet, exercise, etc.).  Your safety is our top priority and we want to make sure you continue to live a long and healthy life!    Healthy Lifestyle Recommendations  As a cancer survivor, it is important develop a lifelong commitment to a healthy lifestyle. A healthy lifestyle can prevent cancer from returning as well as prevent other diseases like heart disease, diabetes and high blood pressure.  These are some things that you can do to have a healthy lifestyle:  Marland Kitchen Maintain a healthy weight.  . Exercise daily per your doctor's orders. . Eat a balanced diet high in fruits, vegetables, bran, and fiber. Limit intake of red meat      and processed foods.  Marland Kitchen  Limit how much alcohol you consume, if at all. Ali Lowe regular bone mineral density testing for osteoporosis.  . Talk to your doctor about cardiovascular disease or "heart disease" screening. . Stop smoking (if you smoke). . Know your family history. . Be mindful of your emotional, social, and spiritual needs. . Meet regularly with a Primary Care Provider (PCP). Find a PCP if you do not             already have one. . Talk to your doctor about regular cancer screening including screening for colon           cancer, GYN cancers, and skin cancer.

## 2015-07-22 NOTE — Telephone Encounter (Signed)
cld & spoke to pt and gave pt time & date of [place] [facility]

## 2015-07-24 ENCOUNTER — Ambulatory Visit
Admission: RE | Admit: 2015-07-24 | Discharge: 2015-07-24 | Disposition: A | Discharge status: Home / Self Care | Payer: PRIVATE HEALTH INSURANCE | Source: Ambulatory Visit | Attending: Nurse Practitioner | Admitting: Nurse Practitioner

## 2015-07-24 DIAGNOSIS — T888XXD Other specified complications of surgical and medical care, not elsewhere classified, subsequent encounter: Secondary | ICD-10-CM

## 2015-07-24 DIAGNOSIS — C50912 Malignant neoplasm of unspecified site of left female breast: Secondary | ICD-10-CM

## 2015-07-24 DIAGNOSIS — T792XXD Traumatic secondary and recurrent hemorrhage and seroma, subsequent encounter: Secondary | ICD-10-CM

## 2015-08-04 DIAGNOSIS — Z853 Personal history of malignant neoplasm of breast: Secondary | ICD-10-CM | POA: Insufficient documentation

## 2015-08-04 DIAGNOSIS — Z9012 Acquired absence of left breast and nipple: Secondary | ICD-10-CM | POA: Insufficient documentation

## 2015-08-05 ENCOUNTER — Other Ambulatory Visit: Payer: Self-pay | Admitting: Nurse Practitioner

## 2015-08-05 ENCOUNTER — Telehealth: Payer: Self-pay | Admitting: Nurse Practitioner

## 2015-08-05 DIAGNOSIS — T792XXD Traumatic secondary and recurrent hemorrhage and seroma, subsequent encounter: Secondary | ICD-10-CM

## 2015-08-05 DIAGNOSIS — C50412 Malignant neoplasm of upper-outer quadrant of left female breast: Secondary | ICD-10-CM

## 2015-08-05 DIAGNOSIS — T888XXD Other specified complications of surgical and medical care, not elsewhere classified, subsequent encounter: Secondary | ICD-10-CM

## 2015-08-05 NOTE — Telephone Encounter (Signed)
Received call from Ms. Catherine Atkins after she saw Dr. Iran Planas.  Pt does want to pursue breast MRI to fully evaluate seroma and help determine next steps.  Will get MR scheduled and coordinate with Drs. Thimmappa and Rosenbower.

## 2015-08-06 ENCOUNTER — Encounter: Payer: Self-pay | Admitting: Adult Health

## 2015-08-06 ENCOUNTER — Other Ambulatory Visit: Payer: Self-pay | Admitting: Nurse Practitioner

## 2015-08-06 DIAGNOSIS — C50412 Malignant neoplasm of upper-outer quadrant of left female breast: Secondary | ICD-10-CM

## 2015-08-06 NOTE — Progress Notes (Signed)
A birthday card was mailed to the patient today on behalf of the Survivorship Program at [facility].   Gretchen Dawson, NP Survivorship Program [facility] [phone]  

## 2015-08-10 ENCOUNTER — Telehealth: Payer: Self-pay | Admitting: Oncology

## 2015-08-10 ENCOUNTER — Other Ambulatory Visit: Payer: Self-pay | Admitting: Nurse Practitioner

## 2015-08-10 NOTE — Telephone Encounter (Signed)
lvm for pt regarding to added lab... °

## 2015-08-17 ENCOUNTER — Other Ambulatory Visit (HOSPITAL_BASED_OUTPATIENT_CLINIC_OR_DEPARTMENT_OTHER): Payer: PRIVATE HEALTH INSURANCE

## 2015-08-17 ENCOUNTER — Ambulatory Visit
Admission: RE | Admit: 2015-08-17 | Discharge: 2015-08-17 | Disposition: A | Discharge status: Home / Self Care | Payer: PRIVATE HEALTH INSURANCE | Source: Ambulatory Visit | Attending: Nurse Practitioner | Admitting: Nurse Practitioner

## 2015-08-17 DIAGNOSIS — C50912 Malignant neoplasm of unspecified site of left female breast: Secondary | ICD-10-CM | POA: Diagnosis not present

## 2015-08-17 DIAGNOSIS — C50412 Malignant neoplasm of upper-outer quadrant of left female breast: Secondary | ICD-10-CM

## 2015-08-17 DIAGNOSIS — T792XXD Traumatic secondary and recurrent hemorrhage and seroma, subsequent encounter: Secondary | ICD-10-CM

## 2015-08-17 DIAGNOSIS — T888XXD Other specified complications of surgical and medical care, not elsewhere classified, subsequent encounter: Secondary | ICD-10-CM

## 2015-08-17 LAB — COMPREHENSIVE METABOLIC PANEL
ALT: 17 U/L (ref 0–55)
ANION GAP: 9 meq/L (ref 3–11)
AST: 29 U/L (ref 5–34)
Albumin: 3.5 g/dL (ref 3.5–5.0)
Alkaline Phosphatase: 43 U/L (ref 40–150)
BUN: 30.8 mg/dL — ABNORMAL HIGH (ref 7.0–26.0)
CHLORIDE: 110 meq/L — AB (ref 98–109)
CO2: 25 meq/L (ref 22–29)
CREATININE: 1.7 mg/dL — AB (ref 0.6–1.1)
Calcium: 9.3 mg/dL (ref 8.4–10.4)
EGFR: 30 mL/min/{1.73_m2} — ABNORMAL LOW (ref >90)
Glucose: 127 mg/dl (ref 70–140)
Potassium: 3.9 mEq/L (ref 3.5–5.1)
Sodium: 144 mEq/L (ref 136–145)
Total Bilirubin: <0.3 mg/dL (ref 0.20–1.20)
Total Protein: 7 g/dL (ref 6.4–8.3)

## 2015-09-08 ENCOUNTER — Telehealth: Payer: Self-pay | Admitting: Nurse Practitioner

## 2015-09-08 NOTE — Telephone Encounter (Signed)
Called to speak with patient about recent MRI results after discussions with Drs. Thimappa and Rosenbower.  Left message asking for return call.  Will await pt's call.

## 2015-09-08 NOTE — Telephone Encounter (Signed)
Received return call from patient.  Willing to consider surgical excision of seroma; will facilitate follow up appointment for her with Dr. Iran Planas.

## 2015-10-05 DIAGNOSIS — IMO0002 Reserved for concepts with insufficient information to code with codable children: Secondary | ICD-10-CM

## 2015-10-05 HISTORY — DX: Reserved for concepts with insufficient information to code with codable children: IMO0002

## 2015-10-27 NOTE — H&P (Signed)
  Subjective:    Patient ID: Catherine Atkins is a 65 y.o. female.  Follow-up   Here for follow up discussion chronic seroma left chest. PMH significant for mastectomy and SLN 01/2012 with Dr. Margot Chimes for Stage IA IDC ER/PR +, Her 2 -tumor. Continues on adjuvant tamoxifen. She developed seroma soon after drain removal and had multiple aspirations by Dr. Margot Chimes last 2014, states they just stopped trying to aspirate as it recurred each time. Notes given size and firmness, providers mistakenly feel she has had reconstruction. In fact her first post mastectomy MMG in 2014 was done as bilateral, and large seroma noted on that film. She does not need prosthesis as she is similar in volume with bra. She most recently had [place] and MRI was recommended given findings. Since last visit we attempted to obtain MRI but her creatinine was elevated. Per report, her PCP recommended that she not complete exam. She is here to discuss surgical options. Returns with concerns seroma is growing, more tight and larger over lateral chest wall.  EXAM: ULTRASOUND OF THE LEFT BREAST  COMPARISON: None  FINDINGS: On physical exam, mastectomy identified with the left breast enlarged  Targeted ultrasound is performed, showing complicated fluid and heterogeneous echoes/ tissue filling and significantly enlarging the mastectomy space. Complicated fluid measures at least 20 cm in greatest diameter.  IMPRESSION: Complicated fluid and heterogeneous echoes/tissue filling and significantly enlarging the left mastectomy space. Although this may be related to postoperative changes, neoplasm is not excluded and MRI is recommended for further evaluation.  RECOMMENDATION: Breast MRI.  I have discussed the findings and recommendations with the patient. Results were also provided in writing at the conclusion of the visit. If applicable, a reminder letter will be sent to the patient regarding the next  appointment.  BI-RADS CATEGORY 0: Incomplete. Need additional imaging evaluation and/or prior mammograms for comparison.   Electronically Signed  By: Margarette Canada M.D.  On: 07/24/2015 12:53   Review of Systems    Objective:   Physical Exam  Constitutional: She is oriented to person, place, and time.  Cardiovascular: Normal rate, regular rhythm and normal heart sounds.  Pulmonary/Chest: Effort normal and breath sounds normal.  Neurological: She is alert and oriented to person, place, and time.    Left chest large mass, no cellulitis, very firm No axillary adenopathy R breast grade 2 ptosis no masses SN to nipple R 29 cm BW R 21 L 16 cm Nipple to IMF R 11 cm    Assessment:     Hx left breast cancer, s/p mastectomy Chronic seroma cavity left chest    Plan:     Reviewed her US demonstrated large seroma and has many septae and counseled that aspiration alone would likely have limited benefit. I recommend she have chronic cavity excised and drain placed as it is difficult to follow her clinically with regards to cancer recurrence. At least presently, her Cr also prevents her from MRI for surveillance. Dicussed OP procedure, will return to flat chest, 1-2 drains, post procedure compression, visits, limitations. Recommend she take at least 2 weeks off from work and then work part time for additional week to follow. Reviewed that at this time her skin has stretched quite a bit from seroma and will require skin excision to return to flat chest.   Irene Limbo, MD [facility] Plastic & Reconstructive Surgery [phone], pin [phone]

## 2015-10-30 ENCOUNTER — Encounter (HOSPITAL_BASED_OUTPATIENT_CLINIC_OR_DEPARTMENT_OTHER): Payer: Self-pay | Admitting: *Deleted

## 2015-10-30 NOTE — Pre-Procedure Instructions (Signed)
To come for BMET; EKG req. From Dr. Ria Comment office.

## 2015-11-05 ENCOUNTER — Encounter (HOSPITAL_BASED_OUTPATIENT_CLINIC_OR_DEPARTMENT_OTHER)
Admission: RE | Admit: 2015-11-05 | Discharge: 2015-11-05 | Disposition: A | Discharge status: Home / Self Care | Payer: No Typology Code available for payment source | Source: Ambulatory Visit | Attending: Plastic Surgery | Admitting: Plastic Surgery

## 2015-11-05 DIAGNOSIS — I10 Essential (primary) hypertension: Secondary | ICD-10-CM | POA: Diagnosis not present

## 2015-11-05 DIAGNOSIS — Z853 Personal history of malignant neoplasm of breast: Secondary | ICD-10-CM | POA: Diagnosis not present

## 2015-11-05 DIAGNOSIS — L7634 Postprocedural seroma of skin and subcutaneous tissue following other procedure: Secondary | ICD-10-CM | POA: Diagnosis not present

## 2015-11-05 DIAGNOSIS — Y838 Other surgical procedures as the cause of abnormal reaction of the patient, or of later complication, without mention of misadventure at the time of the procedure: Secondary | ICD-10-CM | POA: Diagnosis not present

## 2015-11-05 DIAGNOSIS — Z9012 Acquired absence of left breast and nipple: Secondary | ICD-10-CM | POA: Diagnosis not present

## 2015-11-05 LAB — BASIC METABOLIC PANEL
Anion gap: 6 (ref 5–15)
BUN: 18 mg/dL (ref 6–20)
CHLORIDE: 107 mmol/L (ref 101–111)
CO2: 26 mmol/L (ref 22–32)
CREATININE: 1.56 mg/dL — AB (ref 0.44–1.00)
Calcium: 9.2 mg/dL (ref 8.9–10.3)
GFR calc Af Amer: 39 mL/min — ABNORMAL LOW (ref >60)
GFR calc non Af Amer: 33 mL/min — ABNORMAL LOW (ref >60)
GLUCOSE: 108 mg/dL — AB (ref 65–99)
Potassium: 3.9 mmol/L (ref 3.5–5.1)
SODIUM: 139 mmol/L (ref 135–145)

## 2015-11-06 ENCOUNTER — Ambulatory Visit (HOSPITAL_BASED_OUTPATIENT_CLINIC_OR_DEPARTMENT_OTHER): Payer: No Typology Code available for payment source | Admitting: Anesthesiology

## 2015-11-06 ENCOUNTER — Ambulatory Visit (HOSPITAL_BASED_OUTPATIENT_CLINIC_OR_DEPARTMENT_OTHER)
Admission: RE | Admit: 2015-11-06 | Discharge: 2015-11-06 | Disposition: A | Discharge status: Home / Self Care | Payer: No Typology Code available for payment source | Source: Ambulatory Visit | Attending: Plastic Surgery | Admitting: Plastic Surgery

## 2015-11-06 ENCOUNTER — Encounter (HOSPITAL_BASED_OUTPATIENT_CLINIC_OR_DEPARTMENT_OTHER)
Admission: RE | Disposition: A | Discharge status: Home / Self Care | Payer: Self-pay | Source: Ambulatory Visit | Attending: Plastic Surgery

## 2015-11-06 ENCOUNTER — Encounter (HOSPITAL_BASED_OUTPATIENT_CLINIC_OR_DEPARTMENT_OTHER): Payer: Self-pay

## 2015-11-06 DIAGNOSIS — Z853 Personal history of malignant neoplasm of breast: Secondary | ICD-10-CM | POA: Insufficient documentation

## 2015-11-06 DIAGNOSIS — Y838 Other surgical procedures as the cause of abnormal reaction of the patient, or of later complication, without mention of misadventure at the time of the procedure: Secondary | ICD-10-CM | POA: Insufficient documentation

## 2015-11-06 DIAGNOSIS — Z9012 Acquired absence of left breast and nipple: Secondary | ICD-10-CM | POA: Insufficient documentation

## 2015-11-06 DIAGNOSIS — L7634 Postprocedural seroma of skin and subcutaneous tissue following other procedure: Secondary | ICD-10-CM | POA: Diagnosis not present

## 2015-11-06 DIAGNOSIS — I10 Essential (primary) hypertension: Secondary | ICD-10-CM | POA: Insufficient documentation

## 2015-11-06 HISTORY — DX: Personal history of malignant neoplasm of breast: Z85.3

## 2015-11-06 HISTORY — DX: Dental restoration status: Z98.811

## 2015-11-06 HISTORY — DX: Unspecified cataract: H26.9

## 2015-11-06 HISTORY — PX: DEBRIDEMENT AND CLOSURE WOUND: SHX5614

## 2015-11-06 HISTORY — DX: Reserved for concepts with insufficient information to code with codable children: IMO0002

## 2015-11-06 SURGERY — DEBRIDEMENT, WOUND, WITH CLOSURE
Anesthesia: General | Site: Chest | Laterality: Left

## 2015-11-06 MED ORDER — EPHEDRINE SULFATE 50 MG/ML IJ SOLN
INTRAMUSCULAR | Status: DC | PRN
  Administered 2015-11-06 (×4): 10 mg via INTRAVENOUS

## 2015-11-06 MED ORDER — GLYCOPYRROLATE 0.2 MG/ML IJ SOLN
INTRAMUSCULAR | Status: DC | PRN
  Administered 2015-11-06: 0.2 mg via INTRAVENOUS

## 2015-11-06 MED ORDER — MIDAZOLAM HCL 2 MG/2ML IJ SOLN
1.0000 mg | INTRAMUSCULAR | Status: DC | PRN
  Administered 2015-11-06: 2 mg via INTRAVENOUS

## 2015-11-06 MED ORDER — PROMETHAZINE HCL 25 MG/ML IJ SOLN: 6.2500 mg | INTRAMUSCULAR | Status: DC | PRN

## 2015-11-06 MED ORDER — PROPOFOL 10 MG/ML IV BOLUS
INTRAVENOUS | Status: DC | PRN
Start: 2015-11-06 — End: 2015-11-06
  Administered 2015-11-06: 150 mg via INTRAVENOUS

## 2015-11-06 MED ORDER — LIDOCAINE HCL (CARDIAC) 20 MG/ML IV SOLN
INTRAVENOUS | Status: DC | PRN
  Administered 2015-11-06: 40 mg via INTRAVENOUS

## 2015-11-06 MED ORDER — HYDROMORPHONE HCL 1 MG/ML IJ SOLN
0.2500 mg | INTRAMUSCULAR | Status: DC | PRN
  Administered 2015-11-06 (×3): 0.5 mg via INTRAVENOUS

## 2015-11-06 MED ORDER — GLYCOPYRROLATE 0.2 MG/ML IV SOSY
PREFILLED_SYRINGE | INTRAVENOUS | Status: AC
  Filled 2015-11-06: qty 3

## 2015-11-06 MED ORDER — CEFAZOLIN SODIUM-DEXTROSE 2-4 GM/100ML-% IV SOLN
2.0000 g | INTRAVENOUS | Status: AC
  Administered 2015-11-06: 2 g via INTRAVENOUS

## 2015-11-06 MED ORDER — LIDOCAINE 2% (20 MG/ML) 5 ML SYRINGE
INTRAMUSCULAR | Status: AC
  Filled 2015-11-06: qty 5

## 2015-11-06 MED ORDER — FENTANYL CITRATE (PF) 100 MCG/2ML IJ SOLN
INTRAMUSCULAR | Status: AC
  Filled 2015-11-06: qty 2

## 2015-11-06 MED ORDER — PHENYLEPHRINE HCL 10 MG/ML IJ SOLN
INTRAMUSCULAR | Status: DC | PRN
  Administered 2015-11-06 (×2): 40 ug via INTRAVENOUS

## 2015-11-06 MED ORDER — HYDROCODONE-ACETAMINOPHEN 5-325 MG PO TABS: 1.0000 | ORAL_TABLET | ORAL | Status: DC | PRN

## 2015-11-06 MED ORDER — HYDROMORPHONE HCL 1 MG/ML IJ SOLN
INTRAMUSCULAR | Status: AC
  Filled 2015-11-06: qty 1

## 2015-11-06 MED ORDER — ONDANSETRON HCL 4 MG/2ML IJ SOLN
INTRAMUSCULAR | Status: AC
  Filled 2015-11-06: qty 2

## 2015-11-06 MED ORDER — KETOROLAC TROMETHAMINE 30 MG/ML IJ SOLN
INTRAMUSCULAR | Status: AC
  Filled 2015-11-06: qty 1

## 2015-11-06 MED ORDER — SCOPOLAMINE 1 MG/3DAYS TD PT72: 1.0000 | MEDICATED_PATCH | Freq: Once | TRANSDERMAL | Status: DC | PRN

## 2015-11-06 MED ORDER — LIDOCAINE-EPINEPHRINE 1 %-1:100000 IJ SOLN
INTRAMUSCULAR | Status: AC
  Filled 2015-11-06: qty 1

## 2015-11-06 MED ORDER — OXYCODONE HCL 5 MG PO TABS
ORAL_TABLET | ORAL | Status: AC
  Filled 2015-11-06: qty 1

## 2015-11-06 MED ORDER — CHLORHEXIDINE GLUCONATE 4 % EX LIQD: 1.0000 "application " | Freq: Once | CUTANEOUS | Status: DC

## 2015-11-06 MED ORDER — GLYCOPYRROLATE 0.2 MG/ML IJ SOLN: 0.2000 mg | Freq: Once | INTRAMUSCULAR | Status: DC | PRN

## 2015-11-06 MED ORDER — SUCCINYLCHOLINE CHLORIDE 200 MG/10ML IV SOSY
PREFILLED_SYRINGE | INTRAVENOUS | Status: AC
  Filled 2015-11-06: qty 10

## 2015-11-06 MED ORDER — FENTANYL CITRATE (PF) 100 MCG/2ML IJ SOLN
50.0000 ug | INTRAMUSCULAR | Status: DC | PRN
  Administered 2015-11-06 (×2): 50 ug via INTRAVENOUS

## 2015-11-06 MED ORDER — FENTANYL CITRATE (PF) 100 MCG/2ML IJ SOLN
25.0000 ug | INTRAMUSCULAR | Status: DC | PRN
  Administered 2015-11-06: 50 ug via INTRAVENOUS

## 2015-11-06 MED ORDER — EPHEDRINE 5 MG/ML INJ
INTRAVENOUS | Status: AC
  Filled 2015-11-06: qty 10

## 2015-11-06 MED ORDER — DEXAMETHASONE SODIUM PHOSPHATE 10 MG/ML IJ SOLN
INTRAMUSCULAR | Status: AC
  Filled 2015-11-06: qty 1

## 2015-11-06 MED ORDER — LACTATED RINGERS IV SOLN
INTRAVENOUS | Status: DC
  Administered 2015-11-06 (×2): via INTRAVENOUS

## 2015-11-06 MED ORDER — DEXAMETHASONE SODIUM PHOSPHATE 4 MG/ML IJ SOLN
INTRAMUSCULAR | Status: DC | PRN
  Administered 2015-11-06: 10 mg via INTRAVENOUS

## 2015-11-06 MED ORDER — CEFAZOLIN SODIUM-DEXTROSE 2-4 GM/100ML-% IV SOLN
INTRAVENOUS | Status: AC
  Filled 2015-11-06: qty 100

## 2015-11-06 MED ORDER — BUPIVACAINE-EPINEPHRINE 0.25% -1:200000 IJ SOLN
INTRAMUSCULAR | Status: DC | PRN
  Administered 2015-11-06: 20 mL

## 2015-11-06 MED ORDER — KETOROLAC TROMETHAMINE 30 MG/ML IJ SOLN
30.0000 mg | Freq: Once | INTRAMUSCULAR | Status: AC | PRN
  Administered 2015-11-06: 30 mg via INTRAVENOUS

## 2015-11-06 MED ORDER — BACITRACIN ZINC 500 UNIT/GM EX OINT
TOPICAL_OINTMENT | CUTANEOUS | Status: AC
  Filled 2015-11-06: qty 28.35

## 2015-11-06 MED ORDER — OXYCODONE HCL 5 MG/5ML PO SOLN: 5.0000 mg | Freq: Once | ORAL | Status: AC | PRN

## 2015-11-06 MED ORDER — PHENYLEPHRINE HCL 10 MG/ML IJ SOLN
10.0000 mg | INTRAMUSCULAR | Status: DC | PRN
  Administered 2015-11-06: 40 ug/min via INTRAVENOUS

## 2015-11-06 MED ORDER — PHENYLEPHRINE 40 MCG/ML (10ML) SYRINGE FOR IV PUSH (FOR BLOOD PRESSURE SUPPORT)
PREFILLED_SYRINGE | INTRAVENOUS | Status: AC
  Filled 2015-11-06: qty 10

## 2015-11-06 MED ORDER — MIDAZOLAM HCL 2 MG/2ML IJ SOLN
INTRAMUSCULAR | Status: AC
  Filled 2015-11-06: qty 2

## 2015-11-06 MED ORDER — OXYCODONE HCL 5 MG PO TABS
5.0000 mg | ORAL_TABLET | Freq: Once | ORAL | Status: AC | PRN
Start: 2015-11-06 — End: 2015-11-06
  Administered 2015-11-06: 5 mg via ORAL

## 2015-11-06 MED ORDER — BACITRACIN ZINC 500 UNIT/GM EX OINT
TOPICAL_OINTMENT | CUTANEOUS | Status: AC
  Filled 2015-11-06: qty 0.9

## 2015-11-06 SURGICAL SUPPLY — 58 items
APL SKNCLS STERI-STRIP NONHPOA (GAUZE/BANDAGES/DRESSINGS)
BENZOIN TINCTURE PRP APPL 2/3 (GAUZE/BANDAGES/DRESSINGS) IMPLANT
BLADE CLIPPER SURG (BLADE) IMPLANT
BLADE SURG 10 STRL SS (BLADE) ×2 IMPLANT
BLADE SURG 15 STRL LF DISP TIS (BLADE) ×1 IMPLANT
BLADE SURG 15 STRL SS (BLADE)
CANISTER SUCT 1200ML W/VALVE (MISCELLANEOUS) IMPLANT
CHLORAPREP W/TINT 26ML (MISCELLANEOUS) ×2 IMPLANT
CLOSURE WOUND 1/2 X4 (GAUZE/BANDAGES/DRESSINGS)
COVER BACK TABLE 60X90IN (DRAPES) ×3 IMPLANT
COVER MAYO STAND STRL (DRAPES) ×3 IMPLANT
DRAIN CHANNEL 15F RND FF W/TCR (WOUND CARE) ×2 IMPLANT
DRAIN CHANNEL 19F RND (DRAIN) ×2 IMPLANT
DRAPE LAPAROSCOPIC ABDOMINAL (DRAPES) ×2 IMPLANT
DRAPE U-SHAPE 76X120 STRL (DRAPES) ×3 IMPLANT
ELECT BLADE 6.5 .24CM SHAFT (ELECTRODE) ×2 IMPLANT
ELECT COATED BLADE 2.86 ST (ELECTRODE) ×2 IMPLANT
ELECT REM PT RETURN 9FT ADLT (ELECTROSURGICAL) ×3
ELECT REM PT RETURN 9FT PED (ELECTROSURGICAL)
ELECTRODE REM PT RETRN 9FT PED (ELECTROSURGICAL) IMPLANT
ELECTRODE REM PT RTRN 9FT ADLT (ELECTROSURGICAL) IMPLANT
EVACUATOR SILICONE 100CC (DRAIN) ×4 IMPLANT
GLOVE BIO SURGEON STRL SZ 6 (GLOVE) ×3 IMPLANT
GLOVE BIOGEL PI IND STRL 6.5 (GLOVE) IMPLANT
GLOVE BIOGEL PI INDICATOR 6.5 (GLOVE) ×4
GLOVE ECLIPSE 6.5 STRL STRAW (GLOVE) ×2 IMPLANT
GOWN STRL REUS W/ TWL LRG LVL3 (GOWN DISPOSABLE) ×2 IMPLANT
GOWN STRL REUS W/TWL LRG LVL3 (GOWN DISPOSABLE) ×6
LIQUID BAND (GAUZE/BANDAGES/DRESSINGS) IMPLANT
NDL PRECISIONGLIDE 27X1.5 (NEEDLE) ×1 IMPLANT
NEEDLE PRECISIONGLIDE 27X1.5 (NEEDLE) ×3 IMPLANT
NS IRRIG 1000ML POUR BTL (IV SOLUTION) IMPLANT
PACK BASIN DAY SURGERY FS (CUSTOM PROCEDURE TRAY) ×3 IMPLANT
PENCIL BUTTON HOLSTER BLD 10FT (ELECTRODE) ×2 IMPLANT
SHEET MEDIUM DRAPE 40X70 STRL (DRAPES) IMPLANT
SPONGE GAUZE 4X4 12PLY STER LF (GAUZE/BANDAGES/DRESSINGS) IMPLANT
SPONGE LAP 18X18 X RAY DECT (DISPOSABLE) ×2 IMPLANT
STRIP CLOSURE SKIN 1/2X4 (GAUZE/BANDAGES/DRESSINGS) IMPLANT
SUCTION FRAZIER HANDLE 10FR (MISCELLANEOUS)
SUCTION TUBE FRAZIER 10FR DISP (MISCELLANEOUS) IMPLANT
SUT CHROMIC 4 0 PS 2 18 (SUTURE) IMPLANT
SUT ETHILON 2 0 FS 18 (SUTURE) ×2 IMPLANT
SUT ETHILON 4 0 PS 2 18 (SUTURE) IMPLANT
SUT ETHILON 5 0 P 3 18 (SUTURE)
SUT MNCRL AB 4-0 PS2 18 (SUTURE) ×2 IMPLANT
SUT NYLON ETHILON 5-0 P-3 1X18 (SUTURE) IMPLANT
SUT PDS AB 2-0 CT2 27 (SUTURE) ×2 IMPLANT
SUT PLAIN 5 0 P 3 18 (SUTURE) IMPLANT
SUT VIC AB 3-0 PS1 18 (SUTURE) ×6
SUT VIC AB 3-0 PS1 18XBRD (SUTURE) IMPLANT
SUT VICRYL 4-0 PS2 18IN ABS (SUTURE) ×2 IMPLANT
SYR BULB IRRIGATION 50ML (SYRINGE) ×2 IMPLANT
SYR CONTROL 10ML LL (SYRINGE) ×3 IMPLANT
TOWEL OR 17X24 6PK STRL BLUE (TOWEL DISPOSABLE) ×3 IMPLANT
TRAY DSU PREP LF (CUSTOM PROCEDURE TRAY) ×3 IMPLANT
TUBE CONNECTING 20'X1/4 (TUBING) ×2
TUBE CONNECTING 20X1/4 (TUBING) ×2 IMPLANT
YANKAUER SUCT BULB TIP NO VENT (SUCTIONS) ×2 IMPLANT

## 2015-11-06 NOTE — Transfer of Care (Signed)
Immediate Anesthesia Transfer of Care Note  Patient: Catherine Atkins  Procedure(s) Performed: Procedure(s): EXCISION OF LEFT CHEST CHRONIC SEROMA  (Left)  Patient Location: PACU  Anesthesia Type:General  Level of Consciousness: awake and alert   Airway & Oxygen Therapy: Patient Spontanous Breathing and Patient connected to face mask oxygen  Post-op Assessment: Report given to RN and Post -op Vital signs reviewed and stable  Post vital signs: Reviewed and stable  Last Vitals:  Filed Vitals:   11/06/15 0925  BP: 115/70  Pulse: 69  Temp: 36.9 C  Resp: 20    Last Pain: There were no vitals filed for this visit.       Complications: No apparent anesthesia complications

## 2015-11-06 NOTE — Discharge Instructions (Signed)
°Post Anesthesia Home Care Instructions ° °Activity: °Get plenty of rest for the remainder of the day. A responsible adult should stay with you for 24 hours following the procedure.  °For the next 24 hours, DO NOT: °-Drive a car °-Operate machinery °-Drink alcoholic beverages °-Take any medication unless instructed by your physician °-Make any legal decisions or sign important papers. ° °Meals: °Start with liquid foods such as gelatin or soup. Progress to regular foods as tolerated. Avoid greasy, spicy, heavy foods. If nausea and/or vomiting occur, drink only clear liquids until the nausea and/or vomiting subsides. Call your physician if vomiting continues. ° °Special Instructions/Symptoms: °Your throat may feel dry or sore from the anesthesia or the breathing tube placed in your throat during surgery. If this causes discomfort, gargle with warm salt water. The discomfort should disappear within 24 hours. ° °If you had a scopolamine patch placed behind your ear for the management of post- operative nausea and/or vomiting: ° °1. The medication in the patch is effective for 72 hours, after which it should be removed.  Wrap patch in a tissue and discard in the trash. Wash hands thoroughly with soap and water. °2. You may remove the patch earlier than 72 hours if you experience unpleasant side effects which may include dry mouth, dizziness or visual disturbances. °3. Avoid touching the patch. Wash your hands with soap and water after contact with the patch. °  °About my Jackson-Pratt Bulb Drain ° °What is a Jackson-Pratt bulb? °A Jackson-Pratt is a soft, round device used to collect drainage. It is connected to a long, thin drainage catheter, which is held in place by one or two small stiches near your surgical incision site. When the bulb is squeezed, it forms a vacuum, forcing the drainage to empty into the bulb. ° °Emptying the Jackson-Pratt bulb- °To empty the bulb: °1. Release the plug on the top of the bulb. °2.  Pour the bulb's contents into a measuring container which your nurse will provide. °3. Record the time emptied and amount of drainage. Empty the drain(s) as often as your     doctor or nurse recommends. ° °Date                  Time                    Amount (Drain 1)                 Amount (Drain 2) ° °_____________________________________________________________________ ° °_____________________________________________________________________ ° °_____________________________________________________________________ ° °_____________________________________________________________________ ° °_____________________________________________________________________ ° °_____________________________________________________________________ ° °_____________________________________________________________________ ° °_____________________________________________________________________ ° °Squeezing the Jackson-Pratt Bulb- °To squeeze the bulb: °1. Make sure the plug at the top of the bulb is open. °2. Squeeze the bulb tightly in your fist. You will hear air squeezing from the bulb. °3. Replace the plug while the bulb is squeezed. °4. Use a safety pin to attach the bulb to your clothing. This will keep the catheter from     pulling at the bulb insertion site. ° °When to call your doctor- °Call your doctor if: °· Drain site becomes red, swollen or hot. °· You have a fever greater than 101 degrees F. °· There is oozing at the drain site. °· Drain falls out (apply a guaze bandage over the drain hole and secure it with tape). °· Drainage increases daily not related to activity patterns. (You will usually have more drainage when you are active than when you are resting.) °· Drainage has a bad   odor. ° ° °

## 2015-11-06 NOTE — Interval H&P Note (Signed)
History and Physical Interval Note:  11/06/2015 10:20 AM  Catherine Atkins  has presented today for surgery, with the diagnosis of HISTORY OF BREAST CANCER,ACQUIRED ABCESS BREAST,AND SEROMA OF LEFT CHEST  The various methods of treatment have been discussed with the patient and family. After consideration of risks, benefits and other options for treatment, the patient has consented to  Procedure(s): EXCISION OF LEFT CHEST CHRONIC SEROMA  (Left) as a surgical intervention .  The patient's history has been reviewed, patient examined, no change in status, stable for surgery.  I have reviewed the patient's chart and labs.  Questions were answered to the patient's satisfaction.     Sula Fetterly

## 2015-11-06 NOTE — Anesthesia Postprocedure Evaluation (Signed)
Anesthesia Post Note  Patient: Catherine Atkins  Procedure(s) Performed: Procedure(s) (LRB): EXCISION OF LEFT CHEST CHRONIC SEROMA  (Left)  Patient location during evaluation: PACU Anesthesia Type: General Level of consciousness: awake and alert Pain management: pain level controlled Vital Signs Assessment: post-procedure vital signs reviewed and stable Respiratory status: spontaneous breathing, nonlabored ventilation, respiratory function stable and patient connected to nasal cannula oxygen Cardiovascular status: blood pressure returned to baseline and stable Postop Assessment: no signs of nausea or vomiting Anesthetic complications: no    Last Vitals:  Filed Vitals:   11/06/15 1245 11/06/15 1300  BP: 111/62 115/65  Pulse: 88 84  Temp:    Resp: 20 19    Last Pain:  Filed Vitals:   11/06/15 1306  PainSc: 5                  Marlei Glomski S

## 2015-11-06 NOTE — Anesthesia Procedure Notes (Signed)
Procedure Name: LMA Insertion Date/Time: 11/06/2015 10:56 AM Performed by: Melynda Ripple D Pre-anesthesia Checklist: Patient identified, Emergency Drugs available, Suction available and Patient being monitored Patient Re-evaluated:Patient Re-evaluated prior to inductionOxygen Delivery Method: Circle system utilized Preoxygenation: Pre-oxygenation with 100% oxygen Intubation Type: IV induction Ventilation: Mask ventilation without difficulty LMA: LMA inserted LMA Size: 4.0 Number of attempts: 1 Airway Equipment and Method: Bite block Placement Confirmation: positive ETCO2 Tube secured with: Tape Dental Injury: Teeth and Oropharynx as per pre-operative assessment

## 2015-11-06 NOTE — Anesthesia Preprocedure Evaluation (Signed)
Anesthesia Evaluation  Patient identified by MRN, date of birth, ID band Patient awake    Reviewed: Allergy & Precautions, NPO status , Patient's Chart, lab work & pertinent test results  Airway Mallampati: II  TM Distance: >3 FB Neck ROM: Full    Dental no notable dental hx.    Pulmonary neg pulmonary ROS,    Pulmonary exam normal breath sounds clear to auscultation       Cardiovascular hypertension, Pt. on medications Normal cardiovascular exam Rhythm:Regular Rate:Normal     Neuro/Psych negative neurological ROS  negative psych ROS   GI/Hepatic negative GI ROS, Neg liver ROS,   Endo/Other  negative endocrine ROS  Renal/GU negative Renal ROS  negative genitourinary   Musculoskeletal negative musculoskeletal ROS (+)   Abdominal   Peds negative pediatric ROS (+)  Hematology negative hematology ROS (+)   Anesthesia Other Findings   Reproductive/Obstetrics negative OB ROS                             Anesthesia Physical Anesthesia Plan  ASA: II  Anesthesia Plan: General   Post-op Pain Management:    Induction: Intravenous  Airway Management Planned: LMA  Additional Equipment:   Intra-op Plan:   Post-operative Plan: Extubation in OR  Informed Consent: I have reviewed the patients History and Physical, chart, labs and discussed the procedure including the risks, benefits and alternatives for the proposed anesthesia with the patient or authorized representative who has indicated his/her understanding and acceptance.   Dental advisory given  Plan Discussed with: CRNA and Surgeon  Anesthesia Plan Comments:         Anesthesia Quick Evaluation

## 2015-11-06 NOTE — Op Note (Signed)
Operative Note   DATE OF OPERATION: 6.2.17  LOCATION: [facility]-outpatient  SURGICAL DIVISION: Plastic Surgery  PREOPERATIVE DIAGNOSES:  1. History left breast cancer 2. Acquired absence left breast 3. Chronic seroma left chest  POSTOPERATIVE DIAGNOSES:  same  PROCEDURE:  Excision chronic seroma cavity left chest  SURGEON: Irene Limbo MD MBA  ASSISTANT: none  ANESTHESIA:  General.   EBL: 50 ml  COMPLICATIONS: None immediate.   INDICATIONS FOR PROCEDURE:  The patient, Catherine Atkins, is a 65 y.o. female born on [birth year], is here for removal large septated seroma cavity that continues to enlarge following mastectomy.   FINDINGS: 20 x 15 cm seroma cavity, spanning entirety of mastectomy margins. 700 ml dark fluid aspirated.  DESCRIPTION OF PROCEDURE:  The patient's operative site was marked with the patient in the preoperative area. The patient was taken to the operating room. SCDs were placed and IV antibiotics were given. The patient's operative site was prepped and draped in a sterile fashion. A time out was performed and all information was confirmed to be correct. Incision made in prior mastectomy scar and carried through subcutaneous tissue until seroma cavity wall encountered. Cavity incised and drained as noted in findings. Thick cavity wall that spanned entirety of mastectomy cavity, extending posteriorly to latissimus and inferiorly to rectus muscle noted. Cavity dissected from subcutaneous tissue and underlying muscle and chest wall. Cavity irrigated with saline and hemostasis obtained. Local anesthesia infiltrated throughout pectoralis muscle and serratus fascia, throughout mastectomy flaps.  A 19 and 15 Fr drain placed and secured with 2-0 nylon. Quilting sutures with 2-0 PDS placed from pectoralis muscle and chest wall to undersurface mastectomy flap. Incision closure completed in layers with 3-0 vicryl in superficial fascia, 4-0 vicryl in dermis and 4-0 monocryl  subcuticular. Tissue adhesive applied. Dry dressing and breast binder applied.   The patient was allowed to wake from anesthesia, extubated and taken to the recovery room in satisfactory condition.   SPECIMENS: left chest chronic seroma cavity  DRAINS: 15 and 19 Fr JP in left subcutaneous chest  Irene Limbo, MD [facility] Plastic & Reconstructive Surgery [phone], pin [phone]

## 2015-11-09 ENCOUNTER — Encounter (HOSPITAL_BASED_OUTPATIENT_CLINIC_OR_DEPARTMENT_OTHER): Payer: Self-pay | Admitting: Plastic Surgery

## 2015-11-09 ENCOUNTER — Telehealth: Payer: Self-pay | Admitting: Nurse Practitioner

## 2015-11-09 NOTE — Telephone Encounter (Signed)
Called and spoke with patient after recent surgery to remove seroma with Dr. Iran Planas.  Catherine Atkins is doing well with good pain control; no redness / tenderness / fever and decreasing drainage in 2 drains.  She will follow up with Dr. Iran Planas next Monday 11/16/2015 and contact their office with any questions / problems.

## 2015-11-20 ENCOUNTER — Encounter: Payer: Self-pay | Admitting: Genetic Counselor

## 2016-01-15 ENCOUNTER — Other Ambulatory Visit: Payer: Self-pay | Admitting: Oncology

## 2016-01-15 DIAGNOSIS — C50419 Malignant neoplasm of upper-outer quadrant of unspecified female breast: Secondary | ICD-10-CM

## 2016-02-03 ENCOUNTER — Ambulatory Visit
Admission: RE | Admit: 2016-02-03 | Discharge: 2016-02-03 | Disposition: A | Discharge status: Home / Self Care | Payer: No Typology Code available for payment source | Source: Ambulatory Visit | Attending: Nurse Practitioner | Admitting: Nurse Practitioner

## 2016-02-03 ENCOUNTER — Other Ambulatory Visit: Payer: Self-pay

## 2016-02-03 DIAGNOSIS — C50912 Malignant neoplasm of unspecified site of left female breast: Secondary | ICD-10-CM

## 2016-02-03 DIAGNOSIS — C50419 Malignant neoplasm of upper-outer quadrant of unspecified female breast: Secondary | ICD-10-CM

## 2016-02-04 ENCOUNTER — Telehealth: Payer: Self-pay | Admitting: Oncology

## 2016-02-04 ENCOUNTER — Ambulatory Visit (HOSPITAL_BASED_OUTPATIENT_CLINIC_OR_DEPARTMENT_OTHER): Payer: No Typology Code available for payment source | Admitting: Oncology

## 2016-02-04 ENCOUNTER — Other Ambulatory Visit (HOSPITAL_BASED_OUTPATIENT_CLINIC_OR_DEPARTMENT_OTHER): Payer: No Typology Code available for payment source

## 2016-02-04 VITALS — BP 116/70 | HR 71 | Temp 98.6°F | Resp 18 | Ht 70.0 in | Wt 221.5 lb

## 2016-02-04 DIAGNOSIS — Z170 Estrogen receptor positive status [ER+]: Secondary | ICD-10-CM | POA: Diagnosis not present

## 2016-02-04 DIAGNOSIS — C50412 Malignant neoplasm of upper-outer quadrant of left female breast: Secondary | ICD-10-CM

## 2016-02-04 DIAGNOSIS — C50419 Malignant neoplasm of upper-outer quadrant of unspecified female breast: Secondary | ICD-10-CM

## 2016-02-04 DIAGNOSIS — C50512 Malignant neoplasm of lower-outer quadrant of left female breast: Secondary | ICD-10-CM | POA: Diagnosis not present

## 2016-02-04 DIAGNOSIS — Z79810 Long term (current) use of selective estrogen receptor modulators (SERMs): Secondary | ICD-10-CM

## 2016-02-04 LAB — COMPREHENSIVE METABOLIC PANEL
ALBUMIN: 3.3 g/dL — AB (ref 3.5–5.0)
ALK PHOS: 46 U/L (ref 40–150)
ALT: 20 U/L (ref 0–55)
AST: 31 U/L (ref 5–34)
Anion Gap: 8 mEq/L (ref 3–11)
BILIRUBIN TOTAL: 0.31 mg/dL (ref 0.20–1.20)
BUN: 24.2 mg/dL (ref 7.0–26.0)
CALCIUM: 9.2 mg/dL (ref 8.4–10.4)
CO2: 24 mEq/L (ref 22–29)
Chloride: 107 mEq/L (ref 98–109)
Creatinine: 1.5 mg/dL — ABNORMAL HIGH (ref 0.6–1.1)
EGFR: 37 mL/min/{1.73_m2} — AB (ref >90)
Glucose: 109 mg/dl (ref 70–140)
POTASSIUM: 4.5 meq/L (ref 3.5–5.1)
Sodium: 140 mEq/L (ref 136–145)
Total Protein: 7 g/dL (ref 6.4–8.3)

## 2016-02-04 LAB — CBC WITH DIFFERENTIAL/PLATELET
BASO%: 1.3 % (ref 0.0–2.0)
BASOS ABS: 0.1 10*3/uL (ref 0.0–0.1)
EOS ABS: 0.1 10*3/uL (ref 0.0–0.5)
EOS%: 2 % (ref 0.0–7.0)
HEMATOCRIT: 35.1 % (ref 34.8–46.6)
HEMOGLOBIN: 11.5 g/dL — AB (ref 11.6–15.9)
LYMPH#: 2.8 10*3/uL (ref 0.9–3.3)
LYMPH%: 39.7 % (ref 14.0–49.7)
MCH: 31.2 pg (ref 25.1–34.0)
MCHC: 32.8 g/dL (ref 31.5–36.0)
MCV: 95.1 fL (ref 79.5–101.0)
MONO#: 0.6 10*3/uL (ref 0.1–0.9)
MONO%: 8.2 % (ref 0.0–14.0)
NEUT#: 3.4 10*3/uL (ref 1.5–6.5)
NEUT%: 48.8 % (ref 38.4–76.8)
Platelets: 260 10*3/uL (ref 145–400)
RBC: 3.69 10*6/uL — ABNORMAL LOW (ref 3.70–5.45)
RDW: 13.3 % (ref 11.2–14.5)
WBC: 7.1 10*3/uL (ref 3.9–10.3)

## 2016-02-04 NOTE — Progress Notes (Signed)
[place]  Telephone:(336) [phone] Fax:(336) [phone]     ID: Armine Rizzolo OB: [birth year]  MR#: [id number]  YPG#:[id number]  PCP: Asencion Noble, MD GYN:   SU:  Dr. Zella Richer, MD OTHER MD: Kyung Rudd, MD  CHIEF COMPLAINT: Hx of Left Breast Cancer   CURRENT TREATMENT: tamoxifen daily  BREAST CANCER HISTORY Amarri Michaelson was diagnosed with a left breast cancer in 2013, and underwent a left mastectomy on 01/25/2012 for a pT1c, N0, stage IA invasive ductal carcinoma, grade 2. Tumor was ER positive at 100%, PR +80%, HER-2/neu negative, with an MIB-1 of 10%.  Oncotype score was 27, predicting a rate of distant recurrence within 10 years of 18% the patient took tamoxifen for 5 years. Tamoxifen was started in April of 2014.  Subsequent history is as detailed below.   INTERVAL HISTORY: Catherine Atkins returns today for follow-up of her estrogen receptor positive breast cancer. She continues on tamoxifen, with good tolerance. Hot flashes are moderate and vaginal dryness/ wetness is not an issue. She obtains it at a good price.  Since her last visit here her husband died from complications of emphysema. She is grieving normally.  REVIEW OF SYSTEMS: Antoine Fiallos tells me her seroma continues to decrease. She had a recent physical exams through her primary care physician in [place] and everything was "fine". She tells me she needs cataract surgery. She has mild sinus problems. Occasionally her ankles swell. She can be short of breath when climbing stairs. She has chronic stress urinary incontinence. She has rare headaches. She is moderately depressed.   PAST MEDICAL HISTORY: Past Medical History:  Diagnosis Date  . Dental crowns present   . Depression   . History of breast cancer 2013  . Hypertension    states is under control with meds., has been on med. x "years"  . Immature cataract   . 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 ABDOMINAL HYSTERECTOMY  1989   complete  . ABSCESS DRAINAGE     tonsil - as a teenager  . APPENDECTOMY    . COLONOSCOPY    . DEBRIDEMENT AND CLOSURE WOUND Left 11/06/2015   Procedure: EXCISION OF LEFT CHEST CHRONIC SEROMA ;  Surgeon: Irene Limbo, MD;  Location: [place];  Service: Plastics;  Laterality: Left;  Marland Kitchen MASTECTOMY Left 01/25/2012    FAMILY HISTORY (Updated 01/30/2014) Family History  Problem Relation Age of Onset  . Cancer Mother     breast  . Cancer Sister     breast  . Leukemia Father   . Heart attack Brother   . Prostate cancer Maternal Uncle   . Colon cancer Paternal Uncle 39    GYNECOLOGIC HISTORY:  menarche at 71, GX P0, hysterectomy/BSO at age 51, was on HRT w/ premarin for ~ 15 years.  SOCIAL HISTORY: (Updated 01/30/2014) She is a Freight forwarder for replacements, working mostly weekends. Her husband Sonia Side was a retired Estate manager/land agent.He died NOV 0208 from COPD comlications.  He has 3 children from a prior marriage, none in this area. The patient has a poodle at home "and he runs the house." The patient is not a church attender.   ADVANCED DIRECTIVES: In place   HEALTH MAINTENANCE: (updated 01/30/2014)  Social History  Substance Use Topics  . Smoking status: Never Smoker  . Smokeless tobacco: Never Used  . Alcohol use No  Mammogram: 01/24/2014  Colonoscopy: Due in 2015 (GI is in [place]), patient says will schedule soon  PAP: Status post hysterectomy; has pelvic exams with Dr. Willey Blade,  UTD  Bone density: 01/24/2014 normal  Lipid panel: UTD, Dr. Willey Blade   Allergies  Allergen Reactions  . Codeine Itching    Current Outpatient Prescriptions  Medication Sig Dispense Refill  . amLODipine-benazepril (LOTREL) 5-20 MG per capsule Take 1 capsule by mouth daily.    Marland Kitchen aspirin 81 MG tablet Take 81 mg by mouth daily.    Marland Kitchen atenolol (TENORMIN) 50 MG tablet Take 50 mg by mouth daily.    Marland Kitchen  buPROPion (WELLBUTRIN XL) 300 MG 24 hr tablet Take 300 mg by mouth daily.    Marland Kitchen HYDROcodone-acetaminophen (NORCO) 5-325 MG tablet Take 1-2 tablets by mouth every 4 (four) hours as needed for moderate pain. 30 tablet 0  . tamoxifen (NOLVADEX) 20 MG tablet take 1 tablet by mouth once daily 30 tablet 5  . triamterene-hydrochlorothiazide (MAXZIDE) 75-50 MG tablet Take 1 tablet by mouth daily.     No current facility-administered medications for this visit.     OBJECTIVE:  Middle-aged white female  Vitals:   02/04/16 1034  BP: 116/70  Pulse: 71  Resp: 18  Temp: 98.6 F (37 C)     Body mass index is 31.78 kg/m.    ECOG FS: 1 Filed Weights   02/04/16 1034  Weight: 221 lb 8 oz (100.5 kg)     Sclerae unicteric, pupils round and equal Oropharynx clear and moist-- no thrush or other lesions No cervical or supraclavicular adenopathy Lungs no rales or rhonchi Heart regular rate and rhythm Abd soft, nontender, positive bowel sounds MSK no focal spinal tenderness, no upper extremity lymphedema Neuro: nonfocal, well oriented, appropriate affect Breasts: The right breast is unremarkable. The left breast is status post mastectomy. There is no evidence of chest wall recurrence. Her seroma, previously noted, does appear smaller and softer area there is no associated erythema. The left axilla is benign.  LAB RESULTS:   Lab Results  Component Value Date   WBC 7.1 02/04/2016   NEUTROABS 3.4 02/04/2016   HGB 11.5 (L) 02/04/2016   HCT 35.1 02/04/2016   MCV 95.1 02/04/2016   PLT 260 02/04/2016      Chemistry      Component Value Date/Time   NA 140 02/04/2016 1002   K 4.5 02/04/2016 1002   CL 107 11/05/2015 1208   CL 107 11/27/2012 0830   CO2 24 02/04/2016 1002   BUN 24.2 02/04/2016 1002   CREATININE 1.5 (H) 02/04/2016 1002      Component Value Date/Time   CALCIUM 9.2 02/04/2016 1002   ALKPHOS 46 02/04/2016 1002   AST 31 02/04/2016 1002   ALT 20 02/04/2016 1002   BILITOT 0.31  02/04/2016 1002       STUDIES:  Mm Digital Screening Unilat R  Result Date: 02/03/2016 CLINICAL DATA:  Screening. EXAM: DIGITAL SCREENING UNILATERAL RIGHT MAMMOGRAM WITH CAD COMPARISON:  Previous exam(s). ACR Breast Density Category b: There are scattered areas of fibroglandular density. FINDINGS: The patient has had a left mastectomy. There are no findings suspicious for malignancy. Images were processed with CAD. IMPRESSION: No mammographic evidence of malignancy. A result letter of this screening mammogram will be mailed directly to the patient. RECOMMENDATION: Screening mammogram in one year.  (Code:SM-R-59M) BI-RADS CATEGORY  1: Negative. Electronically Signed   By: Lillia Mountain M.D.   On: 02/03/2016 15:56    ASSESSMENT:  65 y.o. [place] woman   (1) S/p left left mastectomy on 01/25/2012 for a lower outer quadrant mpT1c, N0, stage IA invasive ductal carcinoma, grade 2, estrogen receptor 100% positive, progesterone receptor 80% positive, with no HER-2 amplification, and an MIB-1 of 10%.  (2) Oncotype score of 27 predicts a rate of distant recurrence within 10 years of 18% if the patient takes tamoxifen for 5 years   (3) tamoxifen started April 2014   (4) s/p remote hysterectomy/BSO  PLAN: Rheda Kassab is now 4 years out from definitive surgery for her breast cancer with no evidence of disease recurrence. This is very favorable.  She is tolerating tamoxifen well and wonders if she should continue tamoxifen for total of 10 years. I generally do not recommend that for node-negative cases but certainly it is not out of the question. The benefit in terms of risk reduction is in the 2% range.  Today I gave her information on the [facility] program and also the trails to recovery. More generally I encouraged her to increase her activity level.  I will see her again in a year, after her next mammogram. She knows to call for any problems that may develop before that visit. Chauncey Cruel, MD 02/05/2016 4:49 PM

## 2016-02-04 NOTE — Telephone Encounter (Signed)
appt made and avs printed °

## 2016-02-23 ENCOUNTER — Other Ambulatory Visit: Payer: Self-pay | Admitting: Family Medicine

## 2016-02-24 LAB — HGB A1C W/O EAG: Hgb A1c MFr Bld: 5.6 % (ref 4.8–5.6)

## 2016-07-11 ENCOUNTER — Other Ambulatory Visit: Payer: Self-pay | Admitting: Oncology

## 2016-07-11 DIAGNOSIS — C50419 Malignant neoplasm of upper-outer quadrant of unspecified female breast: Secondary | ICD-10-CM

## 2016-07-13 NOTE — Telephone Encounter (Signed)
Chart reviewed.

## 2016-07-20 NOTE — Progress Notes (Signed)
CLINIC:  Survivorship   REASON FOR VISIT:  Routine follow-up for history of breast cancer.   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  She is doing well today.  She is taking Tamoxifen daily and is tolerating it well.  She does have hot flashes that have been present since her breast surgery, however she tells me that she is used to them by now.  She is interested in getting information on participating in [place] at the [facility].      REVIEW OF SYSTEMS:  Catherine Atkins denies fevers, chills, nausea, vomiting, constipation, diarrhea, melena, hematochezia, new pain in her abdomen, chest pain, back pain, lower extremity swelling, new lumps or bumps anywhere, fatigue, vaginal dryness, lower extremity joint aches, palpitations, cough, shortness of breath, orthopnea, or any other concerns.  Breast: Denies any new nodularity, masses, tenderness, nipple changes, or nipple discharge.    PAST MEDICAL/SURGICAL HISTORY:  Past Medical History:  Diagnosis Date  . Dental crowns present   . Depression   . History of breast cancer 2013  . Hypertension    states is under control with meds., has been on med. x "years"  . Immature cataract   . 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 ABDOMINAL HYSTERECTOMY  1989   complete  . ABSCESS DRAINAGE     tonsil - as a teenager  . APPENDECTOMY    . COLONOSCOPY    . DEBRIDEMENT AND CLOSURE WOUND Left 11/06/2015   Procedure:  EXCISION OF LEFT CHEST CHRONIC SEROMA ;  Surgeon: Irene Limbo, MD;  Location: [facility];  Service: Plastics;  Laterality: Left;  Marland Kitchen MASTECTOMY Left 01/25/2012     ALLERGIES:  Allergies  Allergen Reactions  . Codeine Itching     CURRENT MEDICATIONS:  Outpatient Encounter Prescriptions as of 07/21/2016  Medication Sig  . amLODipine-benazepril (LOTREL) 5-20 MG per capsule Take 1 capsule by mouth daily.  Marland Kitchen aspirin 81 MG tablet Take 81 mg by mouth daily.  Marland Kitchen atenolol (TENORMIN) 50 MG tablet Take 50 mg by mouth daily.  Marland Kitchen buPROPion (WELLBUTRIN XL) 300 MG 24 hr tablet Take 300 mg by mouth daily.  Marland Kitchen HYDROcodone-acetaminophen (NORCO) 5-325 MG tablet Take 1-2 tablets by mouth every 4 (four) hours as needed for moderate pain.  . tamoxifen (NOLVADEX) 20 MG tablet take 1 tablet by mouth once daily  . triamterene-hydrochlorothiazide (MAXZIDE) 75-50 MG tablet Take 1 tablet by mouth daily.   No facility-administered encounter medications on file as of 07/21/2016.      ONCOLOGIC FAMILY HISTORY:  Family History  Problem Relation Age of Onset  . Cancer Mother     breast  . Cancer Sister     breast  . Leukemia Father   . Heart attack Brother   . Prostate cancer Maternal Uncle   . Colon cancer Paternal Uncle 47  SOCIAL HISTORY:  Catherine Atkins Atkins is widowed and lives alone in [place], [place].  Her husband passed away in May 07, 2015 and she is slowly getting used to living alone.  Her brother lives about one hour away.  She does not have any children.     Catherine Atkins is currently working at [employer].  She denies any current or history of tobacco, alcohol, or illicit drug use.     PHYSICAL EXAMINATION:  Vital Signs: Vitals:   07/21/16 0837  BP: (!) 102/54  Pulse: 64  Resp: 18  Temp: 98 F (36.7 C)   Filed Weights   07/21/16 0837  Weight: 214 lb 4.8 oz (97.2 kg)   General: Well-nourished, well-appearing female in no acute distress.   Unaccompanied today.   HEENT: Head is normocephalic.  Pupils equal and reactive to light. Conjunctivae clear without exudate.  Sclerae anicteric. Oral mucosa is pink, moist.  Oropharynx is pink without lesions or erythema.  Lymph: No cervical, supraclavicular, infraclavicular, lymphadenopathy noted on palpation.  Cardiovascular: Regular rate and rhythm.Marland Kitchen Respiratory: Clear to auscultation bilaterally. Chest expansion symmetric; breathing non-labored.  Breast Exam:  -Left breast: s/p mastectomy, no nodularity, or skin change, no sign of recurrence  -Right breast: No appreciable masses on palpation. No skin redness, thickening, or peau d'orange appearance; no nipple retraction or nipple discharge; -Axilla: No axillary adenopathy bilaterally.  GI: Abdomen soft and round; non-tender, non-distended. Bowel sounds normoactive. No hepatosplenomegaly.   GU: Deferred.  Neuro: No focal deficits. Steady gait.  Psych: Mood and affect normal and appropriate for situation.  Extremities: No edema. Warm and Well Perfused.  MSK: No focal spinal tenderness to palpation, full range of motion in bilateral upper extremities. Skin: Warm and dry.  LABORATORY DATA:  None for this visit   DIAGNOSTIC IMAGING:  Most recent mammogram:       ASSESSMENT AND PLAN:  Ms.. Atkins is a pleasant 66 y.o. female with history of Stage IA left breast invasive ductal carcinoma, ER+/PR+/HER2-, diagnosed in 2013, treated with mastectomy, and anti-estrogen therapy with Tamoxifen beginning in April, 2014.  She presents to the [facility] for surveillance and routine follow-up.   1. History of breast cancer:  Catherine Atkins Atkins is currently clinically and radiographically without evidence of disease or recurrence of breast cancer. She will be due for mammogram in August, 2018; orders placed today.  She will continue her anti-estrogen therapy with Tamoxifen, with plans to continue for 5-10 years.  She will return to the cancer center  to see her medical oncologist, Dr. Jana Hakim, in August, 2018.  I encouraged her to call me with any questions or concerns before her next visit at the cancer center, and I would be happy to see her sooner, if needed.     2. Bone health:  Given Ms. Hott's age and history of breast cancer, she is at slight risk for bone demineralization. Her last DEXA scan was in 01/2014 and was normal.  She was encouraged to continue with her consumption of foods rich in calcium, as well as partake in weight-bearing activities.  She was given education on specific food and activities to promote bone health.  3. Cancer screening:  Due to Ms. Kubicek's history and her age, she should receive screening for skin cancers and colon cancer.   She is currently overdue for her colonoscopy, and I recommended she call her PCP and set this up.  She was encouraged to follow-up with her PCP for appropriate cancer screenings. We  also discussed self breast awareness, self breast exams, including visual inspection of her breast and chest wall in the mirror.    4. Health maintenance and wellness promotion: Ms. Tapper was encouraged to consume 5-7 servings of fruits and vegetables per day. She was also encouraged to engage in moderate to vigorous exercise for 30 minutes per day most days of the week. She was instructed to limit her alcohol consumption and continue to abstain from tobacco use.  I gave her handouts on [employer], and healthy foods to eat.  I completed paperwork for her to be able to participate in [employer] today.     Dispo:  -Return to cancer center in August, 2018 for follow up with Dr. Jana Hakim as scheduled. -Mammogram order placed for August, 2018.   A total of (30) minutes of face-to-face time was spent with this patient with greater than 50% of that time in counseling and care-coordination.   Charlestine Massed, NP Survivorship Program [place] [phone]   Note: PRIMARY [place] Asencion Noble, [place] [phone]

## 2016-07-21 ENCOUNTER — Ambulatory Visit (HOSPITAL_BASED_OUTPATIENT_CLINIC_OR_DEPARTMENT_OTHER): Payer: No Typology Code available for payment source | Admitting: Adult Health

## 2016-07-21 ENCOUNTER — Encounter: Payer: Self-pay | Admitting: Adult Health

## 2016-07-21 VITALS — BP 102/54 | HR 64 | Temp 98.0°F | Resp 18 | Ht 70.0 in | Wt 214.3 lb

## 2016-07-21 DIAGNOSIS — N951 Menopausal and female climacteric states: Secondary | ICD-10-CM | POA: Diagnosis not present

## 2016-07-21 DIAGNOSIS — Z170 Estrogen receptor positive status [ER+]: Secondary | ICD-10-CM

## 2016-07-21 DIAGNOSIS — Z79810 Long term (current) use of selective estrogen receptor modulators (SERMs): Secondary | ICD-10-CM

## 2016-07-21 DIAGNOSIS — C50912 Malignant neoplasm of unspecified site of left female breast: Secondary | ICD-10-CM

## 2016-07-21 DIAGNOSIS — C50512 Malignant neoplasm of lower-outer quadrant of left female breast: Secondary | ICD-10-CM

## 2016-10-12 ENCOUNTER — Other Ambulatory Visit: Payer: Self-pay | Admitting: Oncology

## 2016-10-12 DIAGNOSIS — C50419 Malignant neoplasm of upper-outer quadrant of unspecified female breast: Secondary | ICD-10-CM

## 2016-10-14 ENCOUNTER — Other Ambulatory Visit: Payer: Self-pay | Admitting: Oncology

## 2016-10-14 DIAGNOSIS — C50419 Malignant neoplasm of upper-outer quadrant of unspecified female breast: Secondary | ICD-10-CM

## 2017-01-05 ENCOUNTER — Other Ambulatory Visit: Payer: Self-pay | Admitting: Oncology

## 2017-01-05 DIAGNOSIS — C50419 Malignant neoplasm of upper-outer quadrant of unspecified female breast: Secondary | ICD-10-CM

## 2017-01-09 ENCOUNTER — Ambulatory Visit: Payer: Self-pay | Admitting: *Deleted

## 2017-01-09 VITALS — BP 98/72 | HR 63 | Ht 70.0 in | Wt 176.0 lb

## 2017-01-09 DIAGNOSIS — Z0000 Encounter for general adult medical examination without abnormal findings: Secondary | ICD-10-CM

## 2017-01-09 NOTE — Progress Notes (Signed)
[employer] insurance premium discount evaluation: Labs Drawn. Replacements ROI form signed. Tobacco Free Attestation form signed.  Forms placed in paper chart.  Okay to route results to pcp per pt. Also route to Magrinat, Onc. Pt has lab draw appt prior to Onc f/u later this month. Will ask MD if labs drawn today will suffice for what he needs as no specific orders are found in epic at this time.

## 2017-01-10 LAB — CMP12+LP+TP+TSH+6AC+CBC/D/PLT
A/G RATIO: 1.8 (ref 1.2–2.2)
ALBUMIN: 4.2 g/dL (ref 3.6–4.8)
ALT: 15 IU/L (ref 0–32)
AST: 18 IU/L (ref 0–40)
Alkaline Phosphatase: 31 IU/L — ABNORMAL LOW (ref 39–117)
BILIRUBIN TOTAL: 0.3 mg/dL (ref 0.0–1.2)
BUN/Creatinine Ratio: 11 — ABNORMAL LOW (ref 12–28)
BUN: 18 mg/dL (ref 8–27)
Basophils Absolute: 0.1 10*3/uL (ref 0.0–0.2)
Basos: 1 %
CREATININE: 1.66 mg/dL — AB (ref 0.57–1.00)
Calcium: 9.9 mg/dL (ref 8.7–10.3)
Chloride: 104 mmol/L (ref 96–106)
Chol/HDL Ratio: 2.3 ratio (ref 0.0–4.4)
Cholesterol, Total: 155 mg/dL (ref 100–199)
EOS (ABSOLUTE): 0.2 10*3/uL (ref 0.0–0.4)
EOS: 3 %
Estimated CHD Risk: <0.5 times avg. (ref 0.0–1.0)
Free Thyroxine Index: 1.6 (ref 1.2–4.9)
GFR calc Af Amer: 36 mL/min/{1.73_m2} — ABNORMAL LOW (ref >59)
GFR, EST NON AFRICAN AMERICAN: 31 mL/min/{1.73_m2} — AB (ref >59)
GGT: 7 IU/L (ref 0–60)
GLOBULIN, TOTAL: 2.4 g/dL (ref 1.5–4.5)
GLUCOSE: 99 mg/dL (ref 65–99)
HDL: 67 mg/dL (ref >39)
HEMOGLOBIN: 11.5 g/dL (ref 11.1–15.9)
Hematocrit: 34.6 % (ref 34.0–46.6)
IMMATURE GRANS (ABS): 0 10*3/uL (ref 0.0–0.1)
IMMATURE GRANULOCYTES: 0 %
Iron: 82 ug/dL (ref 27–139)
LDH: 149 IU/L (ref 119–226)
LDL Calculated: 70 mg/dL (ref 0–99)
LYMPHS ABS: 2.5 10*3/uL (ref 0.7–3.1)
Lymphs: 41 %
MCH: 32.6 pg (ref 26.6–33.0)
MCHC: 33.2 g/dL (ref 31.5–35.7)
MCV: 98 fL — ABNORMAL HIGH (ref 79–97)
MONOCYTES: 7 %
Monocytes Absolute: 0.4 10*3/uL (ref 0.1–0.9)
NEUTROS PCT: 48 %
Neutrophils Absolute: 3 10*3/uL (ref 1.4–7.0)
PHOSPHORUS: 3.4 mg/dL (ref 2.5–4.5)
Platelets: 217 10*3/uL (ref 150–379)
Potassium: 4.4 mmol/L (ref 3.5–5.2)
RBC: 3.53 x10E6/uL — AB (ref 3.77–5.28)
RDW: 13.9 % (ref 12.3–15.4)
Sodium: 142 mmol/L (ref 134–144)
T3 UPTAKE RATIO: 23 % — AB (ref 24–39)
T4 TOTAL: 6.8 ug/dL (ref 4.5–12.0)
TOTAL PROTEIN: 6.6 g/dL (ref 6.0–8.5)
TRIGLYCERIDES: 92 mg/dL (ref 0–149)
TSH: 2.9 u[IU]/mL (ref 0.450–4.500)
URIC ACID: 5.3 mg/dL (ref 2.5–7.1)
VLDL CHOLESTEROL CAL: 18 mg/dL (ref 5–40)
WBC: 6.2 10*3/uL (ref 3.4–10.8)

## 2017-01-10 LAB — HGB A1C W/O EAG: Hgb A1c MFr Bld: 5.2 % (ref 4.8–5.6)

## 2017-01-10 NOTE — Progress Notes (Signed)
Results reviewed with pt. Creatinine increased and GFR decreased, both slightly worsened from previous. Pt reports her PCP watches this closely. No specific interventions at this time. Encouraged pt to discuss with pcp what his preferences are for her daily fluid intake in order to avoid dehydration but also not fluid overload. RBC low, H&H normal. Other labs unremarkable. Pt has had 52# wt loss in past year. BMI now normal. Congratulated pt. She plans to continue wt loss/maintenance program this year. Copy provided to pt. Results routed to pcp and Onc MD & NP per pt request. Also at pt request, asked Onc if pt needed to keep her lab appt that is scheduled prior to her upcoming OV this month since these were drawn. Will f/u with pt once response received.

## 2017-01-11 ENCOUNTER — Other Ambulatory Visit: Payer: Self-pay | Admitting: Oncology

## 2017-01-12 ENCOUNTER — Telehealth: Payer: Self-pay | Admitting: Oncology

## 2017-01-12 NOTE — Telephone Encounter (Signed)
lvm to inform pt of 8/30 appt aty 115 pm per sch msg

## 2017-01-23 ENCOUNTER — Other Ambulatory Visit: Payer: Self-pay | Admitting: Oncology

## 2017-01-26 ENCOUNTER — Other Ambulatory Visit: Payer: No Typology Code available for payment source

## 2017-01-30 ENCOUNTER — Other Ambulatory Visit: Payer: Self-pay | Admitting: *Deleted

## 2017-01-30 ENCOUNTER — Other Ambulatory Visit: Payer: Self-pay | Admitting: Adult Health

## 2017-01-30 DIAGNOSIS — Z1231 Encounter for screening mammogram for malignant neoplasm of breast: Secondary | ICD-10-CM

## 2017-02-01 ENCOUNTER — Other Ambulatory Visit: Payer: Self-pay | Admitting: Oncology

## 2017-02-01 NOTE — Progress Notes (Unsigned)
[facility]  Telephone:(336) [phone] Fax:(336) [phone]     ID: Lavanya Roa OB: April 08, 1951  MR#: [id number]  AXK#:[id number]  PCP: Asencion Noble, MD GYN:   SU:  Dr. Zella Richer, MD OTHER MD: Kyung Rudd, MD  CHIEF COMPLAINT: Hx of Left Breast Cancer   CURRENT TREATMENT: tamoxifen daily  BREAST CANCER HISTORY Linsi Humann was diagnosed with a left breast cancer in 2013, and underwent a left mastectomy on 01/25/2012 for a pT1c, N0, stage IA invasive ductal carcinoma, grade 2. Tumor was ER positive at 100%, PR +80%, HER-2/neu negative, with an MIB-1 of 10%.  Oncotype score was 27, predicting a rate of distant recurrence within 10 years of 18% the patient took tamoxifen for 5 years. Tamoxifen was started in April of 2014.  Subsequent history is as detailed below.   INTERVAL HISTORY: Letitia Sabala returns today for follow-up of her estrogen receptor positive breast cancer. She continues on tamoxifen, with good tolerance. Hot flashes are moderate and vaginal dryness/ wetness is not an issue. She obtains it at a good price.  Since her last visit here her husband died from complications of emphysema. She is grieving normally.  REVIEW OF SYSTEMS: Waynesha Rammel tells me her seroma continues to decrease. She had a recent physical exams through her primary care physician in [place] and everything was "fine". She tells me she needs cataract surgery. She has mild sinus problems. Occasionally her ankles swell. She can be short of breath when climbing stairs. She has chronic stress urinary incontinence. She has rare headaches. She is moderately depressed.   PAST MEDICAL HISTORY: Past Medical History:  Diagnosis Date  . Dental crowns present   . Depression   . History of breast cancer 2013  . Hypertension    states is under control with meds., has been on med. x "years"  . Immature cataract   . 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 ABDOMINAL HYSTERECTOMY  1989   complete  . ABSCESS DRAINAGE     tonsil - as a teenager  . APPENDECTOMY    . COLONOSCOPY    . DEBRIDEMENT AND CLOSURE WOUND Left 11/06/2015   Procedure: EXCISION OF LEFT CHEST CHRONIC SEROMA ;  Surgeon: Irene Limbo, MD;  Location: [facility];  Service: Plastics;  Laterality: Left;  Marland Kitchen MASTECTOMY Left 01/25/2012    FAMILY HISTORY (Updated 01/30/2014) Family History  Problem Relation Age of Onset  . Cancer Mother        breast  . Cancer Sister        breast  . Leukemia Father   . Heart attack Brother   . Prostate cancer Maternal Uncle   . Colon cancer Paternal Uncle 60    GYNECOLOGIC HISTORY:  menarche at 73, GX P0, hysterectomy/BSO at age 53, was on HRT w/ premarin for ~ 15 years.  SOCIAL HISTORY: (Updated 01/30/2014) She is a Freight forwarder for replacements, working mostly weekends. Her husband Sonia Side was a retired Estate manager/land agent.He died NOV 7867 from COPD comlications.  He has 3 children from a prior marriage, none in this area. The patient has a poodle at home "and he runs the house." The patient is not a church attender.   ADVANCED DIRECTIVES: In place   HEALTH MAINTENANCE: (updated 01/30/2014)  Social History  Substance Use Topics  . Smoking status: Never Smoker  . Smokeless tobacco: Never Used  .  Alcohol use No    Mammogram: 01/24/2014  Colonoscopy: Due in 2015 (GI is in [place]), patient says will schedule soon  PAP: Status post hysterectomy; has pelvic exams with Dr. Willey Blade,  UTD  Bone density: 01/24/2014 normal  Lipid panel: UTD, Dr. Willey Blade   Allergies  Allergen Reactions  . Codeine Itching    Current Outpatient Prescriptions  Medication Sig Dispense Refill  . amLODipine-benazepril (LOTREL) 5-20 MG per capsule Take 1 capsule by mouth daily.    Marland Kitchen atenolol (TENORMIN) 50 MG tablet Take 50 mg by mouth daily.    Marland Kitchen buPROPion (WELLBUTRIN XL) 300 MG 24 hr tablet Take  300 mg by mouth daily.    . tamoxifen (NOLVADEX) 20 MG tablet take 1 tablet by mouth once daily 30 tablet 2  . triamterene-hydrochlorothiazide (MAXZIDE) 75-50 MG tablet Take 1 tablet by mouth daily.     No current facility-administered medications for this visit.     OBJECTIVE:  Middle-aged white female  There were no vitals filed for this visit.   There is no height or weight on file to calculate BMI.    ECOG FS: 1 There were no vitals filed for this visit.   Sclerae unicteric, pupils round and equal Oropharynx clear and moist-- no thrush or other lesions No cervical or supraclavicular adenopathy Lungs no rales or rhonchi Heart regular rate and rhythm Abd soft, nontender, positive bowel sounds MSK no focal spinal tenderness, no upper extremity lymphedema Neuro: nonfocal, well oriented, appropriate affect Breasts: The right breast is unremarkable. The left breast is status post mastectomy. There is no evidence of chest wall recurrence. Her seroma, previously noted, does appear smaller and softer area there is no associated erythema. The left axilla is benign.  LAB RESULTS:   Lab Results  Component Value Date   WBC 6.2 01/09/2017   NEUTROABS 3.0 01/09/2017   HGB 11.5 01/09/2017   HCT 34.6 01/09/2017   MCV 98 (H) 01/09/2017   PLT 217 01/09/2017      Chemistry      Component Value Date/Time   NA 142 01/09/2017 1210   NA 140 02/04/2016 1002   K 4.4 01/09/2017 1210   K 4.5 02/04/2016 1002   CL 104 01/09/2017 1210   CL 107 11/27/2012 0830   CO2 24 02/04/2016 1002   BUN 18 01/09/2017 1210   BUN 24.2 02/04/2016 1002   CREATININE 1.66 (H) 01/09/2017 1210   CREATININE 1.5 (H) 02/04/2016 1002      Component Value Date/Time   CALCIUM 9.9 01/09/2017 1210   CALCIUM 9.2 02/04/2016 1002   ALKPHOS 31 (L) 01/09/2017 1210   ALKPHOS 46 02/04/2016 1002   AST 18 01/09/2017 1210   AST 31 02/04/2016 1002   ALT 15 01/09/2017 1210   ALT 20 02/04/2016 1002   BILITOT 0.3 01/09/2017 1210    BILITOT 0.31 02/04/2016 1002       STUDIES:  No results found.  ASSESSMENT: 66 y.o. [place] woman   (1) S/p left left mastectomy on 01/25/2012 for a lower outer quadrant mpT1c, N0, stage IA invasive ductal carcinoma, grade 2, estrogen receptor 100% positive, progesterone receptor 80% positive, with no HER-2 amplification, and an MIB-1 of 10%.  (2) Oncotype score of 27 predicts a rate of distant recurrence within 10 years of 18% if the patient takes tamoxifen for 5 years   (3) tamoxifen started April 2014   (4) s/p remote hysterectomy/BSO  PLAN: Miracle Mongillo is now 4 years out from definitive surgery for  her breast cancer with no evidence of disease recurrence. This is very favorable.  She is tolerating tamoxifen well and wonders if she should continue tamoxifen for total of 10 years. I generally do not recommend that for node-negative cases but certainly it is not out of the question. The benefit in terms of risk reduction is in the 2% range.  Today I gave her information on the [place] program and also the trails to recovery. More generally I encouraged her to increase her activity level.  I will see her again in a year, after her next mammogram. She knows to call for any problems that may develop before that visit. Chauncey Cruel, MD 02/01/2017 7:39 AM

## 2017-02-02 ENCOUNTER — Ambulatory Visit (HOSPITAL_BASED_OUTPATIENT_CLINIC_OR_DEPARTMENT_OTHER): Payer: No Typology Code available for payment source | Admitting: Oncology

## 2017-02-02 VITALS — BP 101/53 | HR 65 | Temp 97.7°F | Resp 16 | Ht 70.0 in | Wt 173.9 lb

## 2017-02-02 DIAGNOSIS — Z79810 Long term (current) use of selective estrogen receptor modulators (SERMs): Secondary | ICD-10-CM | POA: Diagnosis not present

## 2017-02-02 DIAGNOSIS — Z170 Estrogen receptor positive status [ER+]: Secondary | ICD-10-CM

## 2017-02-02 DIAGNOSIS — C50512 Malignant neoplasm of lower-outer quadrant of left female breast: Secondary | ICD-10-CM | POA: Diagnosis not present

## 2017-02-02 NOTE — Progress Notes (Signed)
[facility]  Telephone:(336) [phone] Fax:(336) [phone]     ID: [name] OB: Sep 06, 1950  MR#: [id number]  HFW#:[id number]  PCP: Asencion Noble, MD GYN:   SU:  Dr. Zella Richer, MD OTHER MD: Kyung Rudd, MD  CHIEF COMPLAINT: Hx of Left Breast Cancer   CURRENT TREATMENT: tamoxifen daily  BREAST CANCER HISTORY Catherine Atkins was diagnosed with a left breast cancer in 2013, and underwent a left mastectomy on 01/25/2012 for a pT1c, N0, stage IA invasive ductal carcinoma, grade 2. Tumor was ER positive at 100%, PR +80%, HER-2/neu negative, with an MIB-1 of 10%.  Oncotype score was 27, predicting a rate of distant recurrence within 10 years of 18% the patient took tamoxifen for 5 years. Tamoxifen was started in April of 2014.  Subsequent history is as detailed below.   INTERVAL HISTORY: Catherine Atkins returns today for follow-up and treatment of her estrogen receptor positive breast cancer. The interval history is generally unremarkable. She continues on tamoxifen, with good tolerance. She does have bothersome hot flashes but she "can't live with them". Vaginal wetness is not an issue. She obtains a drug at a good price.  REVIEW OF SYSTEMS: Catherine Atkins enjoys her work and enjoys reading. She is not actually exercising at present. She gets "tired of using the procedures as" and is thinking of possible reconstruction. A detailed review of systems today was otherwise unremarkable   PAST MEDICAL HISTORY: Past Medical History:  Diagnosis Date  . Dental crowns present   . Depression   . History of breast cancer 2013  . Hypertension    states is under control with meds., has been on med. x "years"  . Immature cataract   . 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 ABDOMINAL HYSTERECTOMY  1989   complete  . ABSCESS DRAINAGE     tonsil - as a teenager  . APPENDECTOMY    . COLONOSCOPY    . DEBRIDEMENT AND  CLOSURE WOUND Left 11/06/2015   Procedure: EXCISION OF LEFT CHEST CHRONIC SEROMA ;  Surgeon: Irene Limbo, MD;  Location: [facility];  Service: Plastics;  Laterality: Left;  Marland Kitchen MASTECTOMY Left 01/25/2012    FAMILY HISTORY (Updated 01/30/2014) Family History  Problem Relation Age of Onset  . Cancer Mother        breast  . Cancer Sister        breast  . Leukemia Father   . Heart attack Brother   . Prostate cancer Maternal Uncle   . Colon cancer Paternal Uncle 82    GYNECOLOGIC HISTORY:  menarche at 44, GX P0, hysterectomy/BSO at age 43, was on HRT w/ premarin for ~ 15 years.  SOCIAL HISTORY: (Updated 01/30/2014) She is a Freight forwarder for replacements, working mostly weekends. Her husband Sonia Side was a retired Estate manager/land agent.He died NOV 0277 from COPD comlications.  He has 3 children from a prior marriage, none in this area. The patient has a poodle at home "and he runs the house." The patient is not a church attender.   ADVANCED DIRECTIVES: In place   HEALTH MAINTENANCE: (updated 01/30/2014)  Social History  Substance Use Topics  . Smoking status: Never Smoker  . Smokeless tobacco: Never Used  . Alcohol use No    Mammogram: 01/24/2014  Colonoscopy: Due in 2015 (GI is in [place]), patient says will schedule soon  PAP: Status post hysterectomy; has pelvic  exams with Dr. Willey Blade,  UTD  Bone density: 01/24/2014 normal  Lipid panel: UTD, Dr. Willey Blade   Allergies  Allergen Reactions  . Codeine Itching    Current Outpatient Prescriptions  Medication Sig Dispense Refill  . amLODipine-benazepril (LOTREL) 5-20 MG per capsule Take 1 capsule by mouth daily.    Marland Kitchen atenolol (TENORMIN) 50 MG tablet Take 50 mg by mouth daily.    Marland Kitchen buPROPion (WELLBUTRIN XL) 300 MG 24 hr tablet Take 300 mg by mouth daily.    . tamoxifen (NOLVADEX) 20 MG tablet take 1 tablet by mouth once daily 30 tablet 2  . triamterene-hydrochlorothiazide (MAXZIDE) 75-50 MG tablet Take 1  tablet by mouth as needed.      No current facility-administered medications for this visit.     OBJECTIVE: 66 year old white female Who appears well Vitals:   02/02/17 1257  BP: (!) 101/53  Pulse: 65  Resp: 16  Temp: 97.7 F (36.5 C)  SpO2: 100%     Body mass index is 24.95 kg/m.    ECOG FS: 0 Filed Weights   02/02/17 1257  Weight: 173 lb 14.4 oz (78.9 kg)     Sclerae unicteric, EOMs intact Oropharynx clear and moist No cervical or supraclavicular adenopathy Lungs no rales or rhonchi Heart regular rate and rhythm Abd soft, nontender, positive bowel sounds MSK no focal spinal tenderness, no upper extremity lymphedema Neuro: nonfocal, well oriented, appropriate affect Breasts: The right breast is benign. The left breast is status post mastectomy. There is no evidence of local recurrence. Both axillae are benign.  LAB RESULTS:   Lab Results  Component Value Date   WBC 6.2 01/09/2017   NEUTROABS 3.0 01/09/2017   HGB 11.5 01/09/2017   HCT 34.6 01/09/2017   MCV 98 (H) 01/09/2017   PLT 217 01/09/2017      Chemistry      Component Value Date/Time   NA 142 01/09/2017 1210   NA 140 02/04/2016 1002   K 4.4 01/09/2017 1210   K 4.5 02/04/2016 1002   CL 104 01/09/2017 1210   CL 107 11/27/2012 0830   CO2 24 02/04/2016 1002   BUN 18 01/09/2017 1210   BUN 24.2 02/04/2016 1002   CREATININE 1.66 (H) 01/09/2017 1210   CREATININE 1.5 (H) 02/04/2016 1002      Component Value Date/Time   CALCIUM 9.9 01/09/2017 1210   CALCIUM 9.2 02/04/2016 1002   ALKPHOS 31 (L) 01/09/2017 1210   ALKPHOS 46 02/04/2016 1002   AST 18 01/09/2017 1210   AST 31 02/04/2016 1002   ALT 15 01/09/2017 1210   ALT 20 02/04/2016 1002   BILITOT 0.3 01/09/2017 1210   BILITOT 0.31 02/04/2016 1002       STUDIES:  [facility]  ASSESSMENT: 66 year old [place] woman   (1) S/p left left mastectomy on 01/25/2012 for a lower outer quadrant mpT1c, N0, stage IA  invasive ductal carcinoma, grade 2, estrogen receptor 100% positive, progesterone receptor 80% positive, with no HER-2 amplification, and an MIB-1 of 10%.  (2) Oncotype score of 27 predicts a rate of distant recurrence within 10 years of 18% if the patient takes tamoxifen for 5 years   (3) tamoxifen started April 2014   (4) s/p remote hysterectomy/BSO  (5) left seroma debridement 11/06/2015 showed no evidence of malignancy  PLAN: Catherine Atkins is now 5 years out from definitive surgery for her breast cancer with no evidence of disease recurrence. This is very favorable.  She will complete 5 years of tamoxifen April 2019. Accordingly I'm going to see her in March of next year and that will be her "graduation" visit.  She is interested possibly in left reconstruction. We discussed the various types of reconstruction available and she already has used Dr. Iran Planas for her seroma removal so she will think about it some more and if she decides to consider it more seriously she will give Dr. Iran Planas a call  Her mammogram will be tomorrow. I don't anticipate any unusual findings rate if there are of course she will see me before next March  She knows to call for any other problems that may develop before the next visit MAGRINAT,GUSTAV C, MD 02/02/2017 1:26 PM

## 2017-02-03 ENCOUNTER — Ambulatory Visit (HOSPITAL_COMMUNITY): Payer: Self-pay

## 2017-02-03 ENCOUNTER — Ambulatory Visit: Payer: No Typology Code available for payment source

## 2017-02-03 ENCOUNTER — Ambulatory Visit
Admission: RE | Admit: 2017-02-03 | Discharge: 2017-02-03 | Disposition: A | Discharge status: Home / Self Care | Payer: No Typology Code available for payment source | Source: Ambulatory Visit | Attending: Adult Health | Admitting: Adult Health

## 2017-02-03 DIAGNOSIS — Z1231 Encounter for screening mammogram for malignant neoplasm of breast: Secondary | ICD-10-CM

## 2017-04-19 ENCOUNTER — Other Ambulatory Visit: Payer: Self-pay

## 2017-04-19 DIAGNOSIS — C50419 Malignant neoplasm of upper-outer quadrant of unspecified female breast: Secondary | ICD-10-CM

## 2017-04-19 MED ORDER — TAMOXIFEN CITRATE 20 MG PO TABS: 20.0000 mg | ORAL_TABLET | Freq: Every day | ORAL | 4 refills | Status: DC

## 2017-06-23 ENCOUNTER — Telehealth: Payer: Self-pay | Admitting: Adult Health

## 2017-06-23 NOTE — Telephone Encounter (Signed)
LT PAL 07/21/17. Per LT cancelled 2/15 appointment with her due to patient also scheduled for lab/GM 3/27. Per LT keep scheduled appointments with GM 3/27. Left message for patient and mailed updated schedule.

## 2017-07-21 ENCOUNTER — Encounter: Payer: No Typology Code available for payment source | Admitting: Adult Health

## 2017-08-29 ENCOUNTER — Other Ambulatory Visit: Payer: Self-pay

## 2017-08-29 DIAGNOSIS — Z170 Estrogen receptor positive status [ER+]: Principal | ICD-10-CM

## 2017-08-29 DIAGNOSIS — C50512 Malignant neoplasm of lower-outer quadrant of left female breast: Secondary | ICD-10-CM

## 2017-08-29 NOTE — Progress Notes (Signed)
[place]  [phone]) [phone] [phone]) [phone]     ID: Catherine Atkins OB: September 16, 1950  MR#: [id number]  ZYS#:[id number]  PCP: Asencion Noble, MD GYN:   SU:  [Dr. Jackolyn Confer, MD] OTHER MD: Catherine Rudd, MD  CHIEF COMPLAINT: Estrogen receptor positive breast cancer  CURRENT TREATMENT: Completing 5 years of tamoxifen  BREAST CANCER HISTORY: From the original intake note:  Catherine Atkins was diagnosed with a left breast cancer in 2013, and underwent a left mastectomy on 01/25/2012 for a pT1c, N0, stage IA invasive ductal carcinoma, grade 2. Tumor was ER positive at 100%, PR +80%, HER-2/neu negative, with an MIB-1 of 10%.  Oncotype score was 27, predicting a rate of distant recurrence within 10 years of 18% the patient took tamoxifen for 5 years. Tamoxifen was started in April of 2014.  Subsequent history is as detailed below.   INTERVAL HISTORY: Catherine Atkins returns today for follow-up and treatment of her estrogen receptor positive breast cancer. She continues on tamoxifen, with good tolerance. She had occasional hot flashes. She denies issues with increased vaginal discharge.   Since her last visit, she underwent diagnostic unilateral right breast mammography with CAD and tomography on 02/03/2017 at [facility] showing: breast density category B. There was no evidence of malignancy.   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 She is doing well overall. She has an exercise room at work and she walks on the treadmill for 15 minutes during lunch. She notes that she lost some weight. She reads romance novels while she is at home. She was feeling light headed when she stool up. She denies unusual headaches, visual changes, nausea, vomiting, or dizziness. There has been no unusual cough, phlegm production, or pleurisy. This been no change in bowel or bladder habits. She denies unexplained fatigue or unexplained weight  loss, bleeding, rash, or fever. A detailed review of systems was otherwise stable.     PAST MEDICAL HISTORY: Past Medical History:  Diagnosis Date  . Dental crowns present   . Depression   . History of breast cancer 2013  . Hypertension    states is under control with meds., has been on med. x "years"  . Immature cataract   . 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 ABDOMINAL HYSTERECTOMY  1989   complete  . ABSCESS DRAINAGE     tonsil - as a teenager  . APPENDECTOMY    . COLONOSCOPY    . DEBRIDEMENT AND CLOSURE WOUND Left 11/06/2015   Procedure: EXCISION OF LEFT CHEST CHRONIC SEROMA ;  Surgeon: Irene Limbo, MD;  Location: [facility];  Service: Plastics;  Laterality: Left;  Marland Kitchen MASTECTOMY Left 01/25/2012    FAMILY HISTORY (Updated 01/30/2014) Family History  Problem Relation Age of Onset  . Cancer Mother        breast  . Breast cancer Mother   . Cancer Sister        breast  . Breast cancer Sister   . Leukemia Father   . Heart attack Brother   . Prostate cancer Maternal Uncle   . Colon cancer Paternal Uncle 46    GYNECOLOGIC HISTORY:  menarche at 36, GX P0, hysterectomy/BSO at age 63, was on HRT w/ premarin for ~ 15 years.  SOCIAL HISTORY: (Updated 01/30/2014) She is a Freight forwarder for replacements, working mostly weekends. Her  husband Catherine Atkins was a retired Estate manager/land agent.He died NOV 1245 from COPD comlications.  He has 3 children from a prior marriage, none in this area. The patient has a poodle at home "and he runs the house." The patient is not a church attender.   ADVANCED DIRECTIVES: In place   HEALTH MAINTENANCE: (updated 01/30/2014)  Social History   Tobacco Use  . Smoking status: Never Smoker  . Smokeless tobacco: Never Used  Substance Use Topics  . Alcohol use: No  . Drug use: No    Mammogram: 01/24/2014  Colonoscopy: Due in 2015 (GI is  in [place]), patient says will schedule soon  PAP: Status post hysterectomy; has pelvic exams with Dr. Willey Blade,  [facility]  Bone density: 01/24/2014 normal  Lipid panel: UTD, Dr. Willey Blade   Allergies  Allergen Reactions  . Codeine Itching    Current Outpatient Medications  Medication Sig Dispense Refill  . amLODipine-benazepril (LOTREL) 5-20 MG per capsule Take 1 capsule by mouth daily.    Marland Kitchen atenolol (TENORMIN) 50 MG tablet Take 50 mg by mouth daily.    Marland Kitchen buPROPion (WELLBUTRIN XL) 300 MG 24 hr tablet Take 300 mg by mouth daily.    . tamoxifen (NOLVADEX) 20 MG tablet Take 1 tablet (20 mg total) daily by mouth. 30 tablet 4  . triamterene-hydrochlorothiazide (MAXZIDE) 75-50 MG tablet Take 1 tablet by mouth as needed.      No current facility-administered medications for this visit.     OBJECTIVE:  Middle-aged white woman in no acute distress  Vitals:   08/30/17 1129  BP: (!) 116/50  Pulse: 73  Resp: 17  Temp: 99 F (37.2 C)  SpO2: 100%     Body mass index is 25.27 kg/m.    ECOG FS: 0 Filed Weights   08/30/17 1129  Weight: 176 lb 1.6 oz (79.9 kg)     Sclerae unicteric, pupils round and equal No cervical or supraclavicular adenopathy Lungs no rales or rhonchi Heart regular rate and rhythm Abd soft, nontender, positive bowel sounds MSK no focal spinal tenderness, no upper extremity lymphedema Neuro: nonfocal, well oriented, appropriate affect Breasts: The right breast is unremarkable.  The left breast is status post mastectomy.  There is no evidence of chest wall recurrence.  Both axillae are benign.  LAB RESULTS:   Lab Results  Component Value Date   WBC 5.9 08/30/2017   NEUTROABS 2.8 08/30/2017   HGB 11.5 01/09/2017   HCT 35.1 08/30/2017   MCV 97.7 08/30/2017   PLT 233 08/30/2017      Chemistry      Component Value Date/Time   NA 142 08/30/2017 1059   NA 142 01/09/2017 1210   NA 140 02/04/2016 1002   K 4.6 08/30/2017 1059   K 4.5 02/04/2016 1002   CL 108  08/30/2017 1059   CL 107 11/27/2012 0830   CO2 28 08/30/2017 1059   CO2 24 02/04/2016 1002   BUN 21 08/30/2017 1059   BUN 18 01/09/2017 1210   BUN 24.2 02/04/2016 1002   CREATININE 1.32 (H) 08/30/2017 1059   CREATININE 1.5 (H) 02/04/2016 1002      Component Value Date/Time   CALCIUM 9.5 08/30/2017 1059   CALCIUM 9.2 02/04/2016 1002   ALKPHOS 43 08/30/2017 1059   ALKPHOS 46 02/04/2016 1002   AST 20 08/30/2017 1059   AST 31 02/04/2016 1002   ALT 17 08/30/2017 1059   ALT 20 02/04/2016 1002   BILITOT 0.2 08/30/2017 1059  BILITOT 0.31 02/04/2016 1002       STUDIES: Since her last visit, she underwent diagnostic unilateral right breast mammography with CAD and tomography on 02/03/2017 at [facility] showing: breast density category B. There was no evidence of malignancy.   ASSESSMENT: 66 y.o. [place] woman   (1) S/p left left mastectomy on 01/25/2012 for a lower outer quadrant mpT1c, N0, stage IA invasive ductal carcinoma, grade 2, estrogen receptor 100% positive, progesterone receptor 80% positive, with no HER-2 amplification, and an MIB-1 of 10%.  (2) Oncotype score of 27 predicts a rate of distant recurrence within 10 years of 18% if the patient takes tamoxifen for 5 years   (3) tamoxifen started April 2014 --completed 5 years March 2019  (4) s/p remote hysterectomy/BSO  (5) left seroma debridement 11/06/2015 showed no evidence of malignancy  PLAN: Dymin Dingledine will soon be 6 years out from definitive surgery for her breast cancer with no evidence of disease recurrence.  This is very favorable.  She is completing 5 years of tamoxifen.  We discussed the fact that she could take tamoxifen for another 5 years and lower her risk a little bit more, perhaps by 2 to 3%.  However she very much wants to "get out of the cancer business" and accordingly she will stop tamoxifen as she runs out of her current supply.  She is considering reconstruction on the left but is  deterred by the possibility of infection pain, and some of the complications being reported in the presence currently with implants  At this point I am comfortable releasing her to her primary care physician.  All she will need in terms of breast cancer follow-up is yearly right mammography and yearly physician breast and chest wall exam  I will be glad to see Haynes Dage at any point in the future if and when the need arises but as of now are making no further routine appointments for her here.  Sewell Pitner, Virgie Dad, MD  08/30/17 12:01 PM Medical Oncology and Hematology [facility] [address] [place], [place] [address] Tel. [phone]    Fax. [phone]  This document serves as a record of services personally performed by Lurline Del, MD. It was created on his behalf by Sheron Nightingale, a trained medical scribe. The creation of this record is based on the scribe's personal observations and the provider's statements to them.   I have reviewed the above documentation for accuracy and completeness, and I agree with the above.

## 2017-08-30 ENCOUNTER — Inpatient Hospital Stay: Payer: No Typology Code available for payment source | Attending: Oncology | Admitting: Oncology

## 2017-08-30 ENCOUNTER — Inpatient Hospital Stay: Payer: No Typology Code available for payment source

## 2017-08-30 VITALS — BP 116/50 | HR 73 | Temp 99.0°F | Resp 17 | Ht 70.0 in | Wt 176.1 lb

## 2017-08-30 DIAGNOSIS — Z170 Estrogen receptor positive status [ER+]: Principal | ICD-10-CM

## 2017-08-30 DIAGNOSIS — Z79810 Long term (current) use of selective estrogen receptor modulators (SERMs): Secondary | ICD-10-CM | POA: Diagnosis not present

## 2017-08-30 DIAGNOSIS — C50512 Malignant neoplasm of lower-outer quadrant of left female breast: Secondary | ICD-10-CM

## 2017-08-30 DIAGNOSIS — Z9012 Acquired absence of left breast and nipple: Secondary | ICD-10-CM | POA: Insufficient documentation

## 2017-08-30 DIAGNOSIS — C50812 Malignant neoplasm of overlapping sites of left female breast: Secondary | ICD-10-CM | POA: Insufficient documentation

## 2017-08-30 LAB — CBC WITH DIFFERENTIAL (CANCER CENTER ONLY)
BASOS PCT: 1 %
Basophils Absolute: 0.1 10*3/uL (ref 0.0–0.1)
EOS ABS: 0.1 10*3/uL (ref 0.0–0.5)
EOS PCT: 1 %
HCT: 35.1 % (ref 34.8–46.6)
Hemoglobin: 11.8 g/dL (ref 11.6–15.9)
LYMPHS ABS: 2.4 10*3/uL (ref 0.9–3.3)
Lymphocytes Relative: 41 %
MCH: 32.9 pg (ref 25.1–34.0)
MCHC: 33.6 g/dL (ref 31.5–36.0)
MCV: 97.7 fL (ref 79.5–101.0)
Monocytes Absolute: 0.5 10*3/uL (ref 0.1–0.9)
Monocytes Relative: 9 %
Neutro Abs: 2.8 10*3/uL (ref 1.5–6.5)
Neutrophils Relative %: 48 %
PLATELETS: 233 10*3/uL (ref 145–400)
RBC: 3.59 MIL/uL — AB (ref 3.70–5.45)
RDW: 13.1 % (ref 11.2–14.5)
WBC: 5.9 10*3/uL (ref 3.9–10.3)

## 2017-08-30 LAB — CMP (CANCER CENTER ONLY)
ALK PHOS: 43 U/L (ref 40–150)
ALT: 17 U/L (ref 0–55)
AST: 20 U/L (ref 5–34)
Albumin: 3.5 g/dL (ref 3.5–5.0)
Anion gap: 6 (ref 3–11)
BUN: 21 mg/dL (ref 7–26)
CALCIUM: 9.5 mg/dL (ref 8.4–10.4)
CHLORIDE: 108 mmol/L (ref 98–109)
CO2: 28 mmol/L (ref 22–29)
CREATININE: 1.32 mg/dL — AB (ref 0.60–1.10)
GFR, EST NON AFRICAN AMERICAN: 40 mL/min — AB (ref >60)
GFR, Est AFR Am: 47 mL/min — ABNORMAL LOW (ref >60)
Glucose, Bld: 108 mg/dL (ref 70–140)
Potassium: 4.6 mmol/L (ref 3.5–5.1)
Sodium: 142 mmol/L (ref 136–145)
Total Bilirubin: 0.2 mg/dL (ref 0.2–1.2)
Total Protein: 6.7 g/dL (ref 6.4–8.3)

## 2017-08-31 ENCOUNTER — Telehealth: Payer: Self-pay | Admitting: Oncology

## 2017-08-31 NOTE — Telephone Encounter (Signed)
Per 3/27 no los °

## 2017-09-03 ENCOUNTER — Other Ambulatory Visit: Payer: Self-pay | Admitting: Oncology

## 2017-09-03 DIAGNOSIS — C50419 Malignant neoplasm of upper-outer quadrant of unspecified female breast: Secondary | ICD-10-CM

## 2018-03-08 ENCOUNTER — Ambulatory Visit (INDEPENDENT_AMBULATORY_CARE_PROVIDER_SITE_OTHER): Payer: Self-pay

## 2018-03-08 DIAGNOSIS — Z1211 Encounter for screening for malignant neoplasm of colon: Secondary | ICD-10-CM

## 2018-03-08 MED ORDER — NA SULFATE-K SULFATE-MG SULF 17.5-3.13-1.6 GM/177ML PO SOLN: 1.0000 | ORAL | 0 refills | Status: DC

## 2018-03-08 NOTE — Patient Instructions (Signed)
Catherine Atkins  Dec 02, 1950 MRN: [id number]     Procedure Date: 05/25/18 Time to register: 9:30am Place to register: [facility] Procedure Time: 10:30am Scheduled provider: R. Garfield Cornea, MD    PREPARATION FOR COLONOSCOPY WITH SUPREP BOWEL PREP KIT  Note: Suprep Bowel Prep Kit is a split-dose (2day) regimen. Consumption of BOTH 6-ounce bottles is required for a complete prep.  Please notify us immediately if you are diabetic, take iron supplements, or if you are on Coumadin or any other blood thinners.                                                                                                                                                  2 DAYS BEFORE PROCEDURE:  DATE: 05/23/18   DAY: Wednesday Begin clear liquid diet AFTER your lunch meal. NO SOLID FOODS after this point.  1 DAY BEFORE PROCEDURE:  DATE: 05/24/18   DAY: Thursday Continue clear liquids the entire day - NO SOLID FOOD.   At 6:00pm: Complete steps 1 through 4 below, using ONE (1) 6-ounce bottle, before going to bed. Step 1:  Pour ONE (1) 6-ounce bottle of SUPREP liquid into the mixing container.  Step 2:  Add cool drinking water to the 16 ounce line on the container and mix.  Note: Dilute the solution concentrate as directed prior to use. Step 3:  DRINK ALL the liquid in the container. Step 4:  You MUST drink an additional two (2) or more 16 ounce containers of water over the next one (1) hour.   Continue clear liquids.  DAY OF PROCEDURE:   DATE: 05/25/18   DAY: Friday If you take medications for your heart, blood pressure, or breathing, you may take these medications.   5 hours before your procedure at :5:30am Step 1:  Pour ONE (1) 6-ounce bottle of SUPREP liquid into the mixing container.  Step 2:  Add cool drinking water to the 16 ounce line on the container and mix.  Note: Dilute the solution concentrate as directed prior to use. Step 3:  DRINK ALL the liquid in the container. Step 4:   You MUST drink an additional two (2) or more 16 ounce containers of water over the next one (1) hour. You MUST complete the final glass of water at least 3 hours before your colonoscopy.   Nothing by mouth past 7:30am  You may take your morning medications with sip of water unless we have instructed otherwise.    Please see below for Dietary Information.  CLEAR LIQUIDS INCLUDE:  Water Jello (NOT red in color)   Ice Popsicles (NOT red in color)   Tea (sugar ok, no milk/cream) Powdered fruit flavored drinks  Coffee (sugar ok, no milk/cream) Gatorade/ Lemonade/ Kool-Aid  (NOT red in color)   Juice: apple, white grape, white cranberry Soft drinks  Clear bullion, consomme, broth (fat free beef/chicken/vegetable)  Carbonated beverages (any kind)  Strained chicken noodle soup Hard Candy   Remember: Clear liquids are liquids that will allow you to see your fingers on the other side of a clear glass. Be sure liquids are NOT red in color, and not cloudy, but CLEAR.  DO NOT EAT OR DRINK ANY OF THE FOLLOWING:  Dairy products of any kind   Cranberry juice Tomato juice / V8 juice   Grapefruit juice Orange juice     Red grape juice  Do not eat any solid foods, including such foods as: cereal, oatmeal, yogurt, fruits, vegetables, creamed soups, eggs, bread, crackers, pureed foods in a blender, etc.   HELPFUL HINTS FOR DRINKING PREP SOLUTION:   Make sure prep is extremely cold. Mix and refrigerate the the morning of the prep. You may also put in the freezer.   You may try mixing some Crystal Light or Country Time Lemonade if you prefer. Mix in small amounts; add more if necessary.  Try drinking through a straw  Rinse mouth with water or a mouthwash between glasses, to remove after-taste.  Try sipping on a cold beverage /ice/ popsicles between glasses of prep.  Place a piece of sugar-free hard candy in mouth between glasses.  If you become nauseated, try consuming smaller amounts, or stretch  out the time between glasses. Stop for 30-60 minutes, then slowly start back drinking.     OTHER INSTRUCTIONS  You will need a responsible adult at least 69 years of age to accompany you and drive you home. This person must remain in the waiting room during your procedure. The hospital will cancel your procedure if you do not have a responsible adult with you.   1. Wear loose fitting clothing that is easily removed. 2. Leave jewelry and other valuables at home.  3. Remove all body piercing jewelry and leave at home. 4. Total time from sign-in until discharge is approximately 2-3 hours. 5. You should go home directly after your procedure and rest. You can resume normal activities the day after your procedure. 6. The day of your procedure you should not:  Drive  Make legal decisions  Operate machinery  Drink alcohol  Return to work   You may call the office (Dept: [phone]) before 5:00pm, or page the doctor on call ([phone]) after 5:00pm, for further instructions, if necessary.   Insurance Information YOU WILL NEED TO CHECK WITH YOUR INSURANCE COMPANY FOR THE BENEFITS OF COVERAGE YOU HAVE FOR THIS PROCEDURE.  UNFORTUNATELY, NOT ALL INSURANCE COMPANIES HAVE BENEFITS TO COVER ALL OR PART OF THESE TYPES OF PROCEDURES.  IT IS YOUR RESPONSIBILITY TO CHECK YOUR BENEFITS, HOWEVER, WE WILL BE GLAD TO ASSIST YOU WITH ANY CODES YOUR INSURANCE COMPANY MAY NEED.    PLEASE NOTE THAT MOST INSURANCE COMPANIES WILL NOT COVER A SCREENING COLONOSCOPY FOR PEOPLE UNDER THE AGE OF 50  IF YOU HAVE BCBS INSURANCE, YOU MAY HAVE BENEFITS FOR A SCREENING COLONOSCOPY BUT IF POLYPS ARE FOUND THE DIAGNOSIS WILL CHANGE AND THEN YOU MAY HAVE A DEDUCTIBLE THAT WILL NEED TO BE MET. SO PLEASE MAKE SURE YOU CHECK YOUR BENEFITS FOR A SCREENING COLONOSCOPY AS WELL AS A DIAGNOSTIC COLONOSCOPY.      

## 2018-03-08 NOTE — Progress Notes (Signed)
Gastroenterology Pre-Procedure Review  Request Date:03/08/18 Requesting Physician: Dr.Fagan- last tcs 2004 RMR- no report in epic  PATIENT REVIEW QUESTIONS: The patient responded to the following health history questions as indicated:    1. Diabetes Melitis: no 2. Joint replacements in the past 12 months: no 3. Major health problems in the past 3 months: no 4. Has an artificial valve or MVP: no 5. Has a defibrillator: no 6. Has been advised in past to take antibiotics in advance of a procedure like teeth cleaning: no 7. Family history of colon cancer: no  8. Alcohol Use: no 9. History of sleep apnea: no  10. History of coronary artery or other vascular stents placed within the last 12 months: no 11. History of any prior anesthesia complications: no    MEDICATIONS & ALLERGIES:    Patient reports the following regarding taking any blood thinners:   Plavix? no Aspirin? no Coumadin? no Brilinta? no Xarelto? no Eliquis? no Pradaxa? no Savaysa? no Effient? no  Patient confirms/reports the following medications:  Current Outpatient Medications  Medication Sig Dispense Refill  . amLODipine-benazepril (LOTREL) 5-20 MG per capsule Take 1 capsule by mouth daily.    Marland Kitchen atenolol (TENORMIN) 50 MG tablet Take 25 mg by mouth daily.     Marland Kitchen buPROPion (WELLBUTRIN XL) 300 MG 24 hr tablet Take 300 mg by mouth daily.    Marland Kitchen escitalopram (LEXAPRO) 5 MG tablet 5 mg daily.  3  . hydrochlorothiazide (HYDRODIURIL) 12.5 MG tablet Take 12.5 mg by mouth daily.  2   No current facility-administered medications for this visit.     Patient confirms/reports the following allergies:  Allergies  Allergen Reactions  . Codeine Itching    No orders of the defined types were placed in this encounter.   AUTHORIZATION INFORMATION Primary Insurance: [place],  ID #: [id number] Pre-Cert / Josem Kaufmann required:  Pre-Cert / Josem Kaufmann #:   SCHEDULE INFORMATION: Procedure has been scheduled as follows:  Date: 05/25/18,  Time:10:30 Location: [facility] Dr.Rourk  This Gastroenterology Pre-Precedure Review Form is being routed to the following provider(s): Walden Field NP

## 2018-03-09 ENCOUNTER — Other Ambulatory Visit: Payer: Self-pay | Admitting: Internal Medicine

## 2018-03-09 ENCOUNTER — Other Ambulatory Visit: Payer: Self-pay

## 2018-03-09 DIAGNOSIS — Z1231 Encounter for screening mammogram for malignant neoplasm of breast: Secondary | ICD-10-CM

## 2018-03-09 NOTE — Progress Notes (Signed)
Will need OV due to polypharmacy (Lexapro and Wellbutrin)

## 2018-03-12 NOTE — Progress Notes (Signed)
I have mailed the pt a letter and taken her off the schedule.

## 2018-03-12 NOTE — Progress Notes (Signed)
Please schedule ov and I will mail a letter to the pt.  

## 2018-03-12 NOTE — Progress Notes (Signed)
PATIENT SCHEDULED  °

## 2018-03-14 ENCOUNTER — Other Ambulatory Visit: Payer: Self-pay | Admitting: Internal Medicine

## 2018-03-14 ENCOUNTER — Ambulatory Visit
Admission: RE | Admit: 2018-03-14 | Discharge: 2018-03-14 | Disposition: A | Discharge status: Home / Self Care | Payer: No Typology Code available for payment source | Source: Ambulatory Visit | Attending: Internal Medicine | Admitting: Internal Medicine

## 2018-03-14 DIAGNOSIS — Z1231 Encounter for screening mammogram for malignant neoplasm of breast: Secondary | ICD-10-CM

## 2018-06-11 ENCOUNTER — Telehealth: Payer: Self-pay

## 2018-06-11 ENCOUNTER — Other Ambulatory Visit: Payer: Self-pay

## 2018-06-11 ENCOUNTER — Ambulatory Visit (INDEPENDENT_AMBULATORY_CARE_PROVIDER_SITE_OTHER): Payer: No Typology Code available for payment source | Admitting: Gastroenterology

## 2018-06-11 ENCOUNTER — Encounter: Payer: Self-pay | Admitting: Gastroenterology

## 2018-06-11 DIAGNOSIS — Z79899 Other long term (current) drug therapy: Secondary | ICD-10-CM | POA: Insufficient documentation

## 2018-06-11 DIAGNOSIS — Z1211 Encounter for screening for malignant neoplasm of colon: Secondary | ICD-10-CM

## 2018-06-11 NOTE — Assessment & Plan Note (Signed)
Very pleasant 68 year old female presenting for screening colonoscopy.  Given Lexapro, Wellbutrin will plan for deep sedation.  I have discussed the risks, alternatives, benefits with regards to but not limited to the risk of reaction to medication, bleeding, infection, perforation and the patient is agreeable to proceed. Written consent to be obtained.

## 2018-06-11 NOTE — Progress Notes (Signed)
CC'D TO PCP °

## 2018-06-11 NOTE — Patient Instructions (Signed)
Called [employer], no PA needed for TCS. Ref# [id number]

## 2018-06-11 NOTE — Progress Notes (Signed)
Primary Care Physician:  Asencion Noble, MD  Primary Gastroenterologist:  Garfield Cornea, MD   Chief Complaint  Patient presents with  . consult TCS    HPI:  Catherine Atkins is a 68 y.o. female here to schedule colonoscopy.  Last colonoscopy was in 2004.  Diminutive rectosigmoid polyps removed, diverticulosis.  Pathology not available.  Recall this in 2014 for follow-up colonoscopy but patient never scheduled.  Patient is doing well.  She has no bowel concerns.  No melena or rectal bleeding.  No abdominal pain or upper GI symptoms.  She denies any family history of colon cancer.  Current Outpatient Medications  Medication Sig Dispense Refill  . amLODipine-benazepril (LOTREL) 5-20 MG per capsule Take 1 capsule by mouth daily.    Marland Kitchen atenolol (TENORMIN) 50 MG tablet Take 50 mg by mouth daily.     Marland Kitchen buPROPion (WELLBUTRIN XL) 300 MG 24 hr tablet Take 450 mg by mouth daily.     Marland Kitchen escitalopram (LEXAPRO) 5 MG tablet 5 mg daily.  3  . hydrochlorothiazide (HYDRODIURIL) 12.5 MG tablet Take 12.5 mg by mouth daily.  2  . Na Sulfate-K Sulfate-Mg Sulf (SUPREP BOWEL PREP KIT) 17.5-3.13-1.6 GM/177ML SOLN Take 1 kit by mouth as directed. (Patient not taking: Reported on 06/11/2018) 1 Bottle 0   No current facility-administered medications for this visit.     Allergies as of 06/11/2018 - Review Complete 06/11/2018  Allergen Reaction Noted  . Codeine Itching 12/16/2011    Past Medical History:  Diagnosis Date  . Dental crowns present   . Depression   . History of breast cancer 2013  . Hypertension    states is under control with meds., has been on med. x "years"  . Immature cataract   . 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 ABDOMINAL HYSTERECTOMY  1989   complete  . ABSCESS DRAINAGE     on neck  . APPENDECTOMY    . COLONOSCOPY  2004   Dr. Gala Romney: diverticulosis, diminutive rectosigmoid polyps removed/cold biopsy.  . DEBRIDEMENT AND  CLOSURE WOUND Left 11/06/2015   Procedure: EXCISION OF LEFT CHEST CHRONIC SEROMA ;  Surgeon: Irene Limbo, MD;  Location: [facility];  Service: Plastics;  Laterality: Left;  Marland Kitchen MASTECTOMY Left 01/25/2012    Family History  Problem Relation Age of Onset  . Cancer Mother        breast  . Breast cancer Mother   . Cancer Sister        breast  . Breast cancer Sister   . Leukemia Father   . Heart attack Brother   . Prostate cancer Maternal Uncle   . Colon cancer Neg Hx     Social History   Socioeconomic History  . Marital status: Widowed    Spouse name: Not on file  . Number of children: 0  . Years of education: Not on file  . 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 Financial resource strain: Not on file  . Food insecurity:    Worry: Not on file    Inability: Not on file  . Transportation needs:    Medical: Not on file    Non-medical: Not on file  Tobacco Use  . Smoking status: Never Smoker  . Smokeless tobacco: Never Used  Substance and Sexual Activity  . Alcohol use:  No  . Drug use: No  . Sexual activity: Not on file  Lifestyle  . Physical activity:    Days per week: Not on file    Minutes per session: Not on file  . Stress: Not on file  Relationships  . Social connections:    Talks on phone: Not on file    Gets together: Not on file    Attends religious service: Not on file    Active member of club or organization: Not on file    Attends meetings of clubs or organizations: Not on file    Relationship status: Not on file  . Intimate partner violence:    Fear of current or ex partner: Not on file    Emotionally abused: Not on file    Physically abused: Not on file    Forced sexual activity: Not on file  Other Topics Concern  . Not on file  Social History Narrative  . Not on file      ROS:  General: Negative for anorexia, weight loss, fever,  chills, fatigue, weakness. Eyes: Negative for vision changes.  ENT: Negative for hoarseness, difficulty swallowing , nasal congestion. CV: Negative for chest pain, angina, palpitations, dyspnea on exertion, peripheral edema.  Respiratory: Negative for dyspnea at rest, dyspnea on exertion, cough, sputum, wheezing.  GI: See history of present illness. GU:  Negative for dysuria, hematuria, urinary incontinence, urinary frequency, nocturnal urination.  MS: Negative for joint pain, low back pain.  Derm: Negative for rash or itching.  Neuro: Negative for weakness, abnormal sensation, seizure, frequent headaches, memory loss, confusion.  Psych: Negative for anxiety, depression, suicidal ideation, hallucinations.  Endo: Negative for unusual weight change.  Heme: Negative for bruising or bleeding. Allergy: Negative for rash or hives.    Physical Examination:  BP 117/75   Pulse 70   Temp (!) 97.1 F (36.2 C) (Oral)   Ht 5' 10" (1.778 m)   Wt 211 lb 9.6 oz (96 kg)   BMI 30.36 kg/m    General: Well-nourished, well-developed in no acute distress.  Head: Normocephalic, atraumatic.   Eyes: Conjunctiva pink, no icterus. Mouth: Oropharyngeal mucosa moist and pink , no lesions erythema or exudate. Neck: Supple without thyromegaly, masses, or lymphadenopathy.  Lungs: Clear to auscultation bilaterally.  Heart: Regular rate and rhythm, no murmurs rubs or gallops.  Abdomen: Bowel sounds are normal, nontender, nondistended, no hepatosplenomegaly or masses, no abdominal bruits or    hernia , no rebound or guarding.   Rectal: Not performed Extremities: No lower extremity edema. No clubbing or deformities.  Neuro: Alert and oriented x 4 , grossly normal neurologically.  Skin: Warm and dry, no rash or jaundice.   Psych: Alert and cooperative, normal mood and affect.  

## 2018-06-11 NOTE — H&P (View-Only) (Signed)
Primary Care Physician:  Fagan, Roy, MD  Primary Gastroenterologist:  Michael Rourk, MD   Chief Complaint  Patient presents with  . consult TCS    HPI:  Catherine Atkins is a 67 y.o. female here to schedule colonoscopy.  Last colonoscopy was in 2004.  Diminutive rectosigmoid polyps removed, diverticulosis.  Pathology not available.  Recall this in 2014 for follow-up colonoscopy but patient never scheduled.  Patient is doing well.  She has no bowel concerns.  No melena or rectal bleeding.  No abdominal pain or upper GI symptoms.  She denies any family history of colon cancer.  Current Outpatient Medications  Medication Sig Dispense Refill  . amLODipine-benazepril (LOTREL) 5-20 MG per capsule Take 1 capsule by mouth daily.    . atenolol (TENORMIN) 50 MG tablet Take 50 mg by mouth daily.     . buPROPion (WELLBUTRIN XL) 300 MG 24 hr tablet Take 450 mg by mouth daily.     . escitalopram (LEXAPRO) 5 MG tablet 5 mg daily.  3  . hydrochlorothiazide (HYDRODIURIL) 12.5 MG tablet Take 12.5 mg by mouth daily.  2  . Na Sulfate-K Sulfate-Mg Sulf (SUPREP BOWEL PREP KIT) 17.5-3.13-1.6 GM/177ML SOLN Take 1 kit by mouth as directed. (Patient not taking: Reported on 06/11/2018) 1 Bottle 0   No current facility-administered medications for this visit.     Allergies as of 06/11/2018 - Review Complete 06/11/2018  Allergen Reaction Noted  . Codeine Itching 12/16/2011    Past Medical History:  Diagnosis Date  . Dental crowns present   . Depression   . History of breast cancer 2013  . Hypertension    states is under control with meds., has been on med. x "years"  . Immature cataract   . 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 ABDOMINAL HYSTERECTOMY  1989   complete  . ABSCESS DRAINAGE     on neck  . APPENDECTOMY    . COLONOSCOPY  2004   Dr. Rourk: diverticulosis, diminutive rectosigmoid polyps removed/cold biopsy.  . DEBRIDEMENT AND  CLOSURE WOUND Left 11/06/2015   Procedure: EXCISION OF LEFT CHEST CHRONIC SEROMA ;  Surgeon: Brinda Thimmappa, MD;  Location: [facility];  Service: Plastics;  Laterality: Left;  . MASTECTOMY Left 01/25/2012    Family History  Problem Relation Age of Onset  . Cancer Mother        breast  . Breast cancer Mother   . Cancer Sister        breast  . Breast cancer Sister   . Leukemia Father   . Heart attack Brother   . Prostate cancer Maternal Uncle   . Colon cancer Neg Hx     Social History   Socioeconomic History  . Marital status: Widowed    Spouse name: Not on file  . Number of children: 0  . Years of education: Not on file  . 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 Financial resource strain: Not on file  . Food insecurity:    Worry: Not on file    Inability: Not on file  . Transportation needs:    Medical: Not on file    Non-medical: Not on file  Tobacco Use  . Smoking status: Never Smoker  . Smokeless tobacco: Never Used  Substance and Sexual Activity  . Alcohol use:   No  . Drug use: No  . Sexual activity: Not on file  Lifestyle  . Physical activity:    Days per week: Not on file    Minutes per session: Not on file  . Stress: Not on file  Relationships  . Social connections:    Talks on phone: Not on file    Gets together: Not on file    Attends religious service: Not on file    Active member of club or organization: Not on file    Attends meetings of clubs or organizations: Not on file    Relationship status: Not on file  . Intimate partner violence:    Fear of current or ex partner: Not on file    Emotionally abused: Not on file    Physically abused: Not on file    Forced sexual activity: Not on file  Other Topics Concern  . Not on file  Social History Narrative  . Not on file      ROS:  General: Negative for anorexia, weight loss, fever,  chills, fatigue, weakness. Eyes: Negative for vision changes.  ENT: Negative for hoarseness, difficulty swallowing , nasal congestion. CV: Negative for chest pain, angina, palpitations, dyspnea on exertion, peripheral edema.  Respiratory: Negative for dyspnea at rest, dyspnea on exertion, cough, sputum, wheezing.  GI: See history of present illness. GU:  Negative for dysuria, hematuria, urinary incontinence, urinary frequency, nocturnal urination.  MS: Negative for joint pain, low back pain.  Derm: Negative for rash or itching.  Neuro: Negative for weakness, abnormal sensation, seizure, frequent headaches, memory loss, confusion.  Psych: Negative for anxiety, depression, suicidal ideation, hallucinations.  Endo: Negative for unusual weight change.  Heme: Negative for bruising or bleeding. Allergy: Negative for rash or hives.    Physical Examination:  BP 117/75   Pulse 70   Temp (!) 97.1 F (36.2 C) (Oral)   Ht 5' 10" (1.778 m)   Wt 211 lb 9.6 oz (96 kg)   BMI 30.36 kg/m    General: Well-nourished, well-developed in no acute distress.  Head: Normocephalic, atraumatic.   Eyes: Conjunctiva pink, no icterus. Mouth: Oropharyngeal mucosa moist and pink , no lesions erythema or exudate. Neck: Supple without thyromegaly, masses, or lymphadenopathy.  Lungs: Clear to auscultation bilaterally.  Heart: Regular rate and rhythm, no murmurs rubs or gallops.  Abdomen: Bowel sounds are normal, nontender, nondistended, no hepatosplenomegaly or masses, no abdominal bruits or    hernia , no rebound or guarding.   Rectal: Not performed Extremities: No lower extremity edema. No clubbing or deformities.  Neuro: Alert and oriented x 4 , grossly normal neurologically.  Skin: Warm and dry, no rash or jaundice.   Psych: Alert and cooperative, normal mood and affect.  

## 2018-06-11 NOTE — Telephone Encounter (Signed)
Called and informed pt of pre-op appt 07/04/18 at 9:00am. Letter mailed.

## 2018-06-11 NOTE — Patient Instructions (Signed)
1. Colonoscopy as scheduled. Please see separate instructions. 

## 2018-07-02 NOTE — Patient Instructions (Signed)
Catherine Atkins [facility]  07/02/2018     @PREFPERIOPPHARMACY @   Your procedure is scheduled on  07/09/2018  Report to [facility] at  1200  P.M.  Call this number if you have problems the morning of surgery:  [phone]   Remember:  Follow the diet and prep instructions given to you by Dr Roseanne Kaufman office.                      Take these medicines the morning of surgery with A SIP OF WATER  Amlodipine, atenolol, wellbutrin, lexapro.    Do not wear jewelry, make-up or nail polish.  Do not wear lotions, powders, or perfumes, or deodorant.  Do not shave 48 hours prior to surgery.  Men may shave face and neck.  Do not bring valuables to the hospital.  [facility] is not responsible for any belongings or valuables.  Contacts, dentures or bridgework may not be worn into surgery.  Leave your suitcase in the car.  After surgery it may be brought to your room.  For patients admitted to the hospital, discharge time will be determined by your treatment team.  Patients discharged the day of surgery will not be allowed to drive home.   Name and phone number of your driver:   family Special instructions:  None  Please read over the following fact sheets that you were given. Anesthesia Post-op Instructions and Care and Recovery After Surgery       Colonoscopy, Adult A colonoscopy is an exam to look at the large intestine. It is done to check for problems, such as:  Lumps (tumors).  Growths (polyps).  Swelling (inflammation).  Bleeding. What happens before the procedure? Eating and drinking Follow instructions from your doctor about eating and drinking. These instructions may include:  A few days before the procedure - follow a low-fiber diet. ? Avoid nuts. ? Avoid seeds. ? Avoid dried fruit. ? Avoid raw fruits. ? Avoid vegetables.  1-3 days before the procedure - follow a clear liquid diet. Avoid liquids that have red or purple dye. Drink only clear liquids, such  as: ? Clear broth or bouillon. ? Black coffee or tea. ? Clear juice. ? Clear soft drinks or sports drinks. ? Gelatin dessert. ? Popsicles.  On the day of the procedure - do not eat or drink anything during the 2 hours before the procedure. Up to 2 hours before the procedure, you may continue to drink clear liquids, such as water or clear fruit juice.  Bowel prep If you were prescribed an oral bowel prep:  Take it as told by your doctor. Starting the day before your procedure, you will need to drink a lot of liquid. The liquid will cause you to poop (have bowel movements) until your poop is almost clear or light green.  To clean out your colon, you may also be given: ? Laxative medicines. ? Instructions about how to use an enema.  If your skin or butt gets irritated from diarrhea, you may: ? Wipe the area with wipes that have medicine in them, such as adult wet wipes with aloe and vitamin E. ? Put something on your skin that soothes the area, such as petroleum jelly.  If you throw up (vomit) while drinking the bowel prep, take a break for up to 60 minutes. Then begin the bowel prep again. If you keep throwing up and you cannot take the bowel  prep without throwing up, call your doctor. General instructions  Ask your doctor about: ? Changing or stopping your normal medicines. This is important if you take iron pills, diabetes medicines, or blood thinners. ? Taking medicines such as aspirin and ibuprofen. These medicines can thin your blood. Do not take these medicines unless your doctor tells you to take them.  Plan to have someone take you home from the hospital or clinic. What happens during the procedure?   An IV tube may be put into one of your veins.  You will be given medicine to help you relax (sedative).  To reduce your risk of infection: ? Your doctors will wash their hands. ? Your anal area will be washed with soap.  You will be asked to lie on your side with your  knees bent.  Your doctor will get a long, thin, flexible tube ready. The tube will have a camera and a light on the end.  The tube will be put into your anus.  The tube will be gently put into your large intestine.  Air will be delivered into your large intestine to keep it open. You may feel some pressure or cramping.  The camera will be used to take photos.  A small tissue sample may be removed for testing (biopsy).  If small growths are found, your doctor may remove them and have them checked for cancer.  The tube that was put into your anus will be slowly removed. The procedure may vary among doctors and hospitals. What happens after the procedure?  Your doctor will check on you often until the medicines you were given have worn off.  Do not drive for 24 hours after the procedure.  You may have a small amount of blood in your poop.  You may pass gas.  You may have mild cramps or bloating in your belly (abdomen).  It is up to you to get the results of your procedure. Ask your doctor, or the department performing the procedure, when your results will be ready. Summary  A colonoscopy is an exam to look at the large intestine.  Follow instructions from your doctor about eating and drinking before the procedure.  If you were prescribed an oral bowel prep to clean out your colon, take it as told by your doctor.  Your doctor will check on you often until the medicines you were given have worn off.  Plan to have someone take you home from the hospital or clinic. This information is not intended to replace advice given to you by your health care provider. Make sure you discuss any questions you have with your health care provider. Document Released: 06/25/2010 Document Revised: 03/22/2017 Document Reviewed: 08/04/2015 Elsevier Interactive Patient Education  2019 [employer]  Colonoscopy, Adult, Care After This sheet gives you information about how to care for yourself  after your procedure. Your health care provider may also give you more specific instructions. If you have problems or questions, contact your health care provider. What can I expect after the procedure? After the procedure, it is common to have:  A small amount of blood in your stool for 24 hours after the procedure.  Some gas.  Mild abdominal cramping or bloating. Follow these instructions at home: General instructions  For the first 24 hours after the procedure: ? Do not drive or use machinery. ? Do not sign important documents. ? Do not drink alcohol. ? Do your regular daily activities at a slower pace than  normal. ? Eat soft, easy-to-digest foods.  Take over-the-counter or prescription medicines only as told by your health care provider. Relieving cramping and bloating   Try walking around when you have cramps or feel bloated.  Apply heat to your abdomen as told by your health care provider. Use a heat source that your health care provider recommends, such as a moist heat pack or a heating pad. ? Place a towel between your skin and the heat source. ? Leave the heat on for 20-30 minutes. ? Remove the heat if your skin turns bright red. This is especially important if you are unable to feel pain, heat, or cold. You may have a greater risk of getting burned. Eating and drinking   Drink enough fluid to keep your urine pale yellow.  Resume your normal diet as instructed by your health care provider. Avoid heavy or fried foods that are hard to digest.  Avoid drinking alcohol for as long as instructed by your health care provider. Contact a health care provider if:  You have blood in your stool 2-3 days after the procedure. Get help right away if:  You have more than a small spotting of blood in your stool.  You pass large blood clots in your stool.  Your abdomen is swollen.  You have nausea or vomiting.  You have a fever.  You have increasing abdominal pain that is  not relieved with medicine. Summary  After the procedure, it is common to have a small amount of blood in your stool. You may also have mild abdominal cramping and bloating.  For the first 24 hours after the procedure, do not drive or use machinery, sign important documents, or drink alcohol.  Contact your health care provider if you have a lot of blood in your stool, nausea or vomiting, a fever, or increased abdominal pain. This information is not intended to replace advice given to you by your health care provider. Make sure you discuss any questions you have with your health care provider. Document Released: 01/05/2004 Document Revised: 03/15/2017 Document Reviewed: 08/04/2015 Elsevier Interactive Patient Education  2019 [place] Anesthesia is a term that refers to techniques, procedures, and medicines that help a person stay safe and comfortable during a medical procedure. Monitored anesthesia care, or sedation, is one type of anesthesia. Your anesthesia specialist may recommend sedation if you will be having a procedure that does not require you to be unconscious, such as:  Cataract surgery.  A dental procedure.  A biopsy.  A colonoscopy. During the procedure, you may receive a medicine to help you relax (sedative). There are three levels of sedation:  Mild sedation. At this level, you may feel awake and relaxed. You will be able to follow directions.  Moderate sedation. At this level, you will be sleepy. You may not remember the procedure.  Deep sedation. At this level, you will be asleep. You will not remember the procedure. The more medicine you are given, the deeper your level of sedation will be. Depending on how you respond to the procedure, the anesthesia specialist may change your level of sedation or the type of anesthesia to fit your needs. An anesthesia specialist will monitor you closely during the procedure. Let your health care  provider know about:  Any allergies you have.  All medicines you are taking, including vitamins, herbs, eye drops, creams, and over-the-counter medicines.  Any use of steroids (by mouth or as a cream).  Any problems you  or family members have had with sedatives and anesthetic medicines.  Any blood disorders you have.  Any surgeries you have had.  Any medical conditions you have, such as sleep apnea.  Whether you are pregnant or may be pregnant.  Any use of cigarettes, alcohol, or street drugs. What are the risks? Generally, this is a safe procedure. However, problems may occur, including:  Getting too much medicine (oversedation).  Nausea.  Allergic reaction to medicines.  Trouble breathing. If this happens, a breathing tube may be used to help with breathing. It will be removed when you are awake and breathing on your own.  Heart trouble.  Lung trouble. Before the procedure Staying hydrated Follow instructions from your health care provider about hydration, which may include:  Up to 2 hours before the procedure - you may continue to drink clear liquids, such as water, clear fruit juice, black coffee, and plain tea. Eating and drinking restrictions Follow instructions from your health care provider about eating and drinking, which may include:  8 hours before the procedure - stop eating heavy meals or foods such as meat, fried foods, or fatty foods.  6 hours before the procedure - stop eating light meals or foods, such as toast or cereal.  6 hours before the procedure - stop drinking milk or drinks that contain milk.  2 hours before the procedure - stop drinking clear liquids. Medicines Ask your health care provider about:  Changing or stopping your regular medicines. This is especially important if you are taking diabetes medicines or blood thinners.  Taking medicines such as aspirin and ibuprofen. These medicines can thin your blood. Do not take these medicines  before your procedure if your health care provider instructs you not to. Tests and exams  You will have a physical exam.  You may have blood tests done to show: ? How well your kidneys and liver are working. ? How well your blood can clot. General instructions  Plan to have someone take you home from the hospital or clinic.  If you will be going home right after the procedure, plan to have someone with you for 24 hours.  What happens during the procedure?  Your blood pressure, heart rate, breathing, level of pain and overall condition will be monitored.  An IV tube will be inserted into one of your veins.  Your anesthesia specialist will give you medicines as needed to keep you comfortable during the procedure. This may mean changing the level of sedation.  The procedure will be performed. After the procedure  Your blood pressure, heart rate, breathing rate, and blood oxygen level will be monitored until the medicines you were given have worn off.  Do not drive for 24 hours if you received a sedative.  You may: ? Feel sleepy, clumsy, or nauseous. ? Feel forgetful about what happened after the procedure. ? Have a sore throat if you had a breathing tube during the procedure. ? Vomit. This information is not intended to replace advice given to you by your health care provider. Make sure you discuss any questions you have with your health care provider. Document Released: 02/16/2005 Document Revised: 10/30/2015 Document Reviewed: 09/13/2015 Elsevier Interactive Patient Education  2019 [place], Care After These instructions provide you with information about caring for yourself after your procedure. Your health care provider may also give you more specific instructions. Your treatment has been planned according to current medical practices, but problems sometimes occur. Call your health care  provider if you have any problems or questions after your  procedure. What can I expect after the procedure? After your procedure, you may:  Feel sleepy for several hours.  Feel clumsy and have poor balance for several hours.  Feel forgetful about what happened after the procedure.  Have poor judgment for several hours.  Feel nauseous or vomit.  Have a sore throat if you had a breathing tube during the procedure. Follow these instructions at home: For at least 24 hours after the procedure:      Have a responsible adult stay with you. It is important to have someone help care for you until you are awake and alert.  Rest as needed.  Do not: ? Participate in activities in which you could fall or become injured. ? Drive. ? Use heavy machinery. ? Drink alcohol. ? Take sleeping pills or medicines that cause drowsiness. ? Make important decisions or sign legal documents. ? Take care of children on your own. Eating and drinking  Follow the diet that is recommended by your health care provider.  If you vomit, drink water, juice, or soup when you can drink without vomiting.  Make sure you have little or no nausea before eating solid foods. General instructions  Take over-the-counter and prescription medicines only as told by your health care provider.  If you have sleep apnea, surgery and certain medicines can increase your risk for breathing problems. Follow instructions from your health care provider about wearing your sleep device: ? Anytime you are sleeping, including during daytime naps. ? While taking prescription pain medicines, sleeping medicines, or medicines that make you drowsy.  If you smoke, do not smoke without supervision.  Keep all follow-up visits as told by your health care provider. This is important. Contact a health care provider if:  You keep feeling nauseous or you keep vomiting.  You feel light-headed.  You develop a rash.  You have a fever. Get help right away if:  You have trouble  breathing. Summary  For several hours after your procedure, you may feel sleepy and have poor judgment.  Have a responsible adult stay with you for at least 24 hours or until you are awake and alert. This information is not intended to replace advice given to you by your health care provider. Make sure you discuss any questions you have with your health care provider. Document Released: 09/13/2015 Document Revised: 01/06/2017 Document Reviewed: 09/13/2015 Elsevier Interactive Patient Education  2019 [employer].

## 2018-07-04 ENCOUNTER — Encounter (HOSPITAL_COMMUNITY)
Admission: RE | Admit: 2018-07-04 | Discharge: 2018-07-04 | Disposition: A | Discharge status: Home / Self Care | Payer: No Typology Code available for payment source | Source: Ambulatory Visit | Attending: Internal Medicine | Admitting: Internal Medicine

## 2018-07-04 ENCOUNTER — Other Ambulatory Visit: Payer: Self-pay

## 2018-07-04 ENCOUNTER — Encounter (HOSPITAL_COMMUNITY): Payer: Self-pay

## 2018-07-04 DIAGNOSIS — Z01818 Encounter for other preprocedural examination: Secondary | ICD-10-CM | POA: Diagnosis present

## 2018-07-04 LAB — CBC
HCT: 36.8 % (ref 36.0–46.0)
HEMOGLOBIN: 11.9 g/dL — AB (ref 12.0–15.0)
MCH: 31.7 pg (ref 26.0–34.0)
MCHC: 32.3 g/dL (ref 30.0–36.0)
MCV: 98.1 fL (ref 80.0–100.0)
NRBC: 0 % (ref 0.0–0.2)
Platelets: 255 10*3/uL (ref 150–400)
RBC: 3.75 MIL/uL — AB (ref 3.87–5.11)
RDW: 13.2 % (ref 11.5–15.5)
WBC: 7.7 10*3/uL (ref 4.0–10.5)

## 2018-07-04 LAB — BASIC METABOLIC PANEL
Anion gap: 8 (ref 5–15)
BUN: 26 mg/dL — AB (ref 8–23)
CALCIUM: 9.1 mg/dL (ref 8.9–10.3)
CHLORIDE: 108 mmol/L (ref 98–111)
CO2: 25 mmol/L (ref 22–32)
CREATININE: 1.37 mg/dL — AB (ref 0.44–1.00)
GFR, EST AFRICAN AMERICAN: 45 mL/min — AB (ref >60)
GFR, EST NON AFRICAN AMERICAN: 39 mL/min — AB (ref >60)
Glucose, Bld: 116 mg/dL — ABNORMAL HIGH (ref 70–99)
POTASSIUM: 3.6 mmol/L (ref 3.5–5.1)
SODIUM: 141 mmol/L (ref 135–145)

## 2018-07-09 ENCOUNTER — Ambulatory Visit (HOSPITAL_COMMUNITY): Payer: No Typology Code available for payment source | Admitting: Anesthesiology

## 2018-07-09 ENCOUNTER — Other Ambulatory Visit: Payer: Self-pay

## 2018-07-09 ENCOUNTER — Encounter (HOSPITAL_COMMUNITY)
Admission: RE | Disposition: A | Discharge status: Home / Self Care | Payer: Self-pay | Source: Home / Self Care | Attending: Internal Medicine

## 2018-07-09 ENCOUNTER — Encounter (HOSPITAL_COMMUNITY): Payer: Self-pay | Admitting: *Deleted

## 2018-07-09 ENCOUNTER — Ambulatory Visit (HOSPITAL_COMMUNITY)
Admission: RE | Admit: 2018-07-09 | Discharge: 2018-07-09 | Disposition: A | Discharge status: Home / Self Care | Payer: No Typology Code available for payment source | Attending: Internal Medicine | Admitting: Internal Medicine

## 2018-07-09 DIAGNOSIS — Z86010 Personal history of colonic polyps: Secondary | ICD-10-CM | POA: Diagnosis not present

## 2018-07-09 DIAGNOSIS — Z885 Allergy status to narcotic agent status: Secondary | ICD-10-CM | POA: Insufficient documentation

## 2018-07-09 DIAGNOSIS — Z1211 Encounter for screening for malignant neoplasm of colon: Secondary | ICD-10-CM | POA: Insufficient documentation

## 2018-07-09 DIAGNOSIS — I10 Essential (primary) hypertension: Secondary | ICD-10-CM | POA: Insufficient documentation

## 2018-07-09 DIAGNOSIS — Z8249 Family history of ischemic heart disease and other diseases of the circulatory system: Secondary | ICD-10-CM | POA: Diagnosis not present

## 2018-07-09 DIAGNOSIS — Z79899 Other long term (current) drug therapy: Secondary | ICD-10-CM | POA: Diagnosis not present

## 2018-07-09 DIAGNOSIS — Z853 Personal history of malignant neoplasm of breast: Secondary | ICD-10-CM | POA: Insufficient documentation

## 2018-07-09 DIAGNOSIS — D120 Benign neoplasm of cecum: Secondary | ICD-10-CM | POA: Diagnosis not present

## 2018-07-09 DIAGNOSIS — K5730 Diverticulosis of large intestine without perforation or abscess without bleeding: Secondary | ICD-10-CM | POA: Diagnosis not present

## 2018-07-09 DIAGNOSIS — F329 Major depressive disorder, single episode, unspecified: Secondary | ICD-10-CM | POA: Diagnosis not present

## 2018-07-09 HISTORY — PX: POLYPECTOMY: SHX5525

## 2018-07-09 HISTORY — PX: COLONOSCOPY WITH PROPOFOL: SHX5780

## 2018-07-09 SURGERY — COLONOSCOPY WITH PROPOFOL
Anesthesia: Monitor Anesthesia Care

## 2018-07-09 MED ORDER — STERILE WATER FOR IRRIGATION IR SOLN
Status: DC | PRN
  Administered 2018-07-09: 100 mL

## 2018-07-09 MED ORDER — PROPOFOL 500 MG/50ML IV EMUL
INTRAVENOUS | Status: DC | PRN
  Administered 2018-07-09: 150 ug/kg/min via INTRAVENOUS

## 2018-07-09 MED ORDER — PROPOFOL 10 MG/ML IV BOLUS
INTRAVENOUS | Status: DC | PRN
  Administered 2018-07-09 (×3): 20 mg via INTRAVENOUS
  Administered 2018-07-09: 10 mg via INTRAVENOUS

## 2018-07-09 MED ORDER — CHLORHEXIDINE GLUCONATE CLOTH 2 % EX PADS: 6.0000 | MEDICATED_PAD | Freq: Once | CUTANEOUS | Status: DC

## 2018-07-09 MED ORDER — LACTATED RINGERS IV SOLN
INTRAVENOUS | Status: DC
  Administered 2018-07-09: 14:00:00 via INTRAVENOUS

## 2018-07-09 NOTE — Interval H&P Note (Signed)
History and Physical Interval Note:  07/09/2018 1:27 PM  Catherine Atkins  has presented today for surgery, with the diagnosis of screening colonoscopy  The various methods of treatment have been discussed with the patient and family. After consideration of risks, benefits and other options for treatment, the patient has consented to  Procedure(s) with comments: COLONOSCOPY WITH PROPOFOL (N/A) - 2:00pm as a surgical intervention .  The patient's history has been reviewed, patient examined, no change in status, stable for surgery.  I have reviewed the patient's chart and labs.  Questions were answered to the patient's satisfaction.     Cassell Voorhies   No change.  Screening colonoscopy per plan.  The risks, benefits, limitations, alternatives and imponderables have been reviewed with the patient. Questions have been answered. All parties are agreeable.

## 2018-07-09 NOTE — Anesthesia Postprocedure Evaluation (Signed)
Anesthesia Post Note  Patient: Catherine Atkins  Procedure(s) Performed: COLONOSCOPY WITH PROPOFOL (N/A ) POLYPECTOMY  Patient location during evaluation: PACU Anesthesia Type: MAC Level of consciousness: awake and alert and oriented Pain management: pain level controlled Vital Signs Assessment: post-procedure vital signs reviewed and stable Respiratory status: spontaneous breathing Cardiovascular status: blood pressure returned to baseline and stable Postop Assessment: no apparent nausea or vomiting Anesthetic complications: no     Last Vitals:  Vitals:   07/09/18 1228 07/09/18 1415  BP: 121/70 (!) 83/58  Pulse: 67 65  Resp: (!) 21 10  Temp: 36.7 C (P) 36.9 C  SpO2: 98% 99%    Last Pain:  Vitals:   07/09/18 1407  TempSrc:   PainSc: 0-No pain                 Tylin Stradley

## 2018-07-09 NOTE — Anesthesia Preprocedure Evaluation (Signed)
Anesthesia Evaluation  Patient identified by MRN, date of birth, ID band Patient awake    Reviewed: Allergy & Precautions, H&P , NPO status , Patient's Chart, lab work & pertinent test results, reviewed documented beta blocker date and time   Airway Mallampati: II  TM Distance: >3 FB Neck ROM: full    Dental no notable dental hx.    Pulmonary neg pulmonary ROS,    Pulmonary exam normal breath sounds clear to auscultation       Cardiovascular Exercise Tolerance: Good hypertension, negative cardio ROS   Rhythm:regular Rate:Normal     Neuro/Psych PSYCHIATRIC DISORDERS Depression negative neurological ROS     GI/Hepatic negative GI ROS, Neg liver ROS,   Endo/Other  negative endocrine ROS  Renal/GU negative Renal ROS  negative genitourinary   Musculoskeletal   Abdominal   Peds  Hematology negative hematology ROS (+)   Anesthesia Other Findings History of breast cancer  Reproductive/Obstetrics negative OB ROS                             Anesthesia Physical Anesthesia Plan  ASA: III  Anesthesia Plan: MAC   Post-op Pain Management:    Induction:   PONV Risk Score and Plan:   Airway Management Planned:   Additional Equipment:   Intra-op Plan:   Post-operative Plan:   Informed Consent: I have reviewed the patients History and Physical, chart, labs and discussed the procedure including the risks, benefits and alternatives for the proposed anesthesia with the patient or authorized representative who has indicated his/her understanding and acceptance.     Dental Advisory Given  Plan Discussed with: CRNA  Anesthesia Plan Comments:         Anesthesia Quick Evaluation

## 2018-07-09 NOTE — Transfer of Care (Signed)
Immediate Anesthesia Transfer of Care Note  Patient: Catherine Atkins  Procedure(s) Performed: COLONOSCOPY WITH PROPOFOL (N/A ) POLYPECTOMY  Patient Location: PACU  Anesthesia Type:MAC  Level of Consciousness: awake  Airway & Oxygen Therapy: Patient Spontanous Breathing  Post-op Assessment: Report given to RN  Post vital signs: Reviewed and stable  Last Vitals:  Vitals Value Taken Time  BP 83/58 07/09/2018  2:15 PM  Temp    Pulse 65 07/09/2018  2:17 PM  Resp 9 07/09/2018  2:17 PM  SpO2 98 % 07/09/2018  2:17 PM  Vitals shown include unvalidated device data.  Last Pain:  Vitals:   07/09/18 1407  TempSrc:   PainSc: 0-No pain         Complications: No apparent anesthesia complications

## 2018-07-09 NOTE — Op Note (Signed)
[facility] Patient Name: Catherine Atkins Procedure Date: 07/09/2018 1:26 PM MRN: [id number] Date of Birth: 06-25-1950 Attending MD: Norvel Richards , MD CSN: [id number] Age: 68 Admit Type: Outpatient Procedure:                Colonoscopy Indications:              Screening for colorectal malignant neoplasm Providers:                Norvel Richards, MD, Rosina Lowenstein, RN, Aram Candela Referring MD:              Medicines:                Propofol per Anesthesia Complications:            No immediate complications. Estimated Blood Loss:     Estimated blood loss was minimal. Procedure:                Pre-Anesthesia Assessment:                           - Prior to the procedure, a History and Physical                            was performed, and patient medications and                            allergies were reviewed. The patient's tolerance of                            previous anesthesia was also reviewed. The risks                            and benefits of the procedure and the sedation                            options and risks were discussed with the patient.                            All questions were answered, and informed consent                            was obtained. Prior Anticoagulants: The patient has                            taken no previous anticoagulant or antiplatelet                            agents. ASA Grade Assessment: II - A patient with                            mild systemic disease. After reviewing the risks  and benefits, the patient was deemed in                            satisfactory condition to undergo the procedure.                           After obtaining informed consent, the colonoscope                            was passed under direct vision. Throughout the                            procedure, the patient's blood pressure, pulse, and                            oxygen  saturations were monitored continuously. The                            [id number]-[id number] ([id number]) scope was introduced through                            the anus and advanced to the the cecum, identified                            by appendiceal orifice and ileocecal valve. The                            colonoscopy was performed without difficulty. The                            patient tolerated the procedure well. The quality                            of the bowel preparation was adequate. Scope In: 1:53:22 PM Scope Out: 2:06:45 PM Scope Withdrawal Time: 0 hours 8 minutes 26 seconds  Total Procedure Duration: 0 hours 13 minutes 23 seconds  Findings:      The perianal and digital rectal examinations were normal.      Scattered medium-mouthed diverticula were found in the sigmoid colon and       descending colon.      Three sessile polyps were found in the cecum. The polyps were 4 to 7 mm       in size. These polyps were removed with a cold snare. Resection and       retrieval were complete. Estimated blood loss was minimal.      The exam was otherwise without abnormality on direct and retroflexion       views. Impression:               - Diverticulosis in the sigmoid colon and in the                            descending colon.                           - Three 4 to 7  mm polyps in the cecum, removed with                            a cold snare. Resected and retrieved.                           - The examination was otherwise normal on direct                            and retroflexion views. Moderate Sedation:      Moderate (conscious) sedation was personally administered by an       anesthesia professional. The following parameters were monitored: oxygen       saturation, heart rate, blood pressure, respiratory rate, EKG, adequacy       of pulmonary ventilation, and response to care. Recommendation:           - Patient has a contact number available for                             emergencies. The signs and symptoms of potential                            delayed complications were discussed with the                            patient. Return to normal activities tomorrow.                            Written discharge instructions were provided to the                            patient.                           - Resume previous diet.                           - Continue present medications.                           - Repeat colonoscopy date to be determined after                            pending pathology results are reviewed for                            surveillance.                           - Return to GI office (date not yet determined). Procedure Code(s):        --- Professional ---                           [phone], Colonoscopy, flexible; with removal of  tumor(s), polyp(s), or other lesion(s) by snare                            technique Diagnosis Code(s):        --- Professional ---                           Z12.11, Encounter for screening for malignant                            neoplasm of colon                           D12.0, Benign neoplasm of cecum                           K57.30, Diverticulosis of large intestine without                            perforation or abscess without bleeding CPT copyright 2018 American Medical Association. All rights reserved. The codes documented in this report are preliminary and upon coder review may  be revised to meet current compliance requirements. Cristopher Estimable. Rozalynn Buege, MD Norvel Richards, MD 07/09/2018 2:13:17 PM This report has been signed electronically. Number of Addenda: 0

## 2018-07-09 NOTE — Discharge Instructions (Signed)
Colonoscopy Discharge Instructions  Read the instructions outlined below and refer to this sheet in the next few weeks. These discharge instructions provide you with general information on caring for yourself after you leave the hospital. Your doctor may also give you specific instructions. While your treatment has been planned according to the most current medical practices available, unavoidable complications occasionally occur. If you have any problems or questions after discharge, call Dr. Gala Romney at [phone]. ACTIVITY  You may resume your regular activity, but move at a slower pace for the next 24 hours.   Take frequent rest periods for the next 24 hours.   Walking will help get rid of the air and reduce the bloated feeling in your belly (abdomen).   No driving for 24 hours (because of the medicine (anesthesia) used during the test).    Do not sign any important legal documents or operate any machinery for 24 hours (because of the anesthesia used during the test).  NUTRITION  Drink plenty of fluids.   You may resume your normal diet as instructed by your doctor.   Begin with a light meal and progress to your normal diet. Heavy or fried foods are harder to digest and may make you feel sick to your stomach (nauseated).   Avoid alcoholic beverages for 24 hours or as instructed.  MEDICATIONS  You may resume your normal medications unless your doctor tells you otherwise.  WHAT YOU CAN EXPECT TODAY  Some feelings of bloating in the abdomen.   Passage of more gas than usual.   Spotting of blood in your stool or on the toilet paper.  IF YOU HAD POLYPS REMOVED DURING THE COLONOSCOPY:  No aspirin products for 7 days or as instructed.   No alcohol for 7 days or as instructed.   Eat a soft diet for the next 24 hours.  FINDING OUT THE RESULTS OF YOUR TEST Not all test results are available during your visit. If your test results are not back during the visit, make an appointment  with your caregiver to find out the results. Do not assume everything is normal if you have not heard from your caregiver or the medical facility. It is important for you to follow up on all of your test results.  SEEK IMMEDIATE MEDICAL ATTENTION IF:  You have more than a spotting of blood in your stool.   Your belly is swollen (abdominal distention).   You are nauseated or vomiting.   You have a temperature over 101.   You have abdominal pain or discomfort that is severe or gets worse throughout the day.    Diverticulosis  Diverticulosis is a condition that develops when small pouches (diverticula) form in the wall of the large intestine (colon). The colon is where water is absorbed and stool is formed. The pouches form when the inside layer of the colon pushes through weak spots in the outer layers of the colon. You may have a few pouches or many of them. What are the causes? The cause of this condition is not known. What increases the risk? The following factors may make you more likely to develop this condition:  Being older than age 63. Your risk for this condition increases with age. Diverticulosis is rare among people younger than age 61. By age 53, many people have it.  Eating a low-fiber diet.  Having frequent constipation.  Being overweight.  Not getting enough exercise.  Smoking.  Taking over-the-counter pain medicines, like aspirin and ibuprofen.  Having a family history of diverticulosis. What are the signs or symptoms? In most people, there are no symptoms of this condition. If you do have symptoms, they may include:  Bloating.  Cramps in the abdomen.  Constipation or diarrhea.  Pain in the lower left side of the abdomen. How is this diagnosed? This condition is most often diagnosed during an exam for other colon problems. Because diverticulosis usually has no symptoms, it often cannot be diagnosed independently. This condition may be diagnosed  by:  Using a flexible scope to examine the colon (colonoscopy).  Taking an X-ray of the colon after dye has been put into the colon (barium enema).  Doing a CT scan. How is this treated? You may not need treatment for this condition if you have never developed an infection related to diverticulosis. If you have had an infection before, treatment may include:  Eating a high-fiber diet. This may include eating more fruits, vegetables, and grains.  Taking a fiber supplement.  Taking a live bacteria supplement (probiotic).  Taking medicine to relax your colon.  Taking antibiotic medicines. Follow these instructions at home:  Drink 6-8 glasses of water or more each day to prevent constipation.  Try not to strain when you have a bowel movement.  If you have had an infection before: ? Eat more fiber as directed by your health care provider or your diet and nutrition specialist (dietitian). ? Take a fiber supplement or probiotic, if your health care provider approves.  Take over-the-counter and prescription medicines only as told by your health care provider.  If you were prescribed an antibiotic, take it as told by your health care provider. Do not stop taking the antibiotic even if you start to feel better.  Keep all follow-up visits as told by your health care provider. This is important. Contact a health care provider if:  You have pain in your abdomen.  You have bloating.  You have cramps.  You have not had a bowel movement in 3 days. Get help right away if:  Your pain gets worse.  Your bloating becomes very bad.  You have a fever or chills, and your symptoms suddenly get worse.  You vomit.  You have bowel movements that are bloody or black.  You have bleeding from your rectum. Summary  Diverticulosis is a condition that develops when small pouches (diverticula) form in the wall of the large intestine (colon).  You may have a few pouches or many of  them.  This condition is most often diagnosed during an exam for other colon problems.  If you have had an infection related to diverticulosis, treatment may include increasing the fiber in your diet, taking supplements, or taking medicines. This information is not intended to replace advice given to you by your health care provider. Make sure you discuss any questions you have with your health care provider. Document Released: 02/18/2004 Document Revised: 04/11/2016 Document Reviewed: 04/11/2016 Elsevier Interactive Patient Education  2019 [place].  Colon Polyps  Polyps are tissue growths inside the body. Polyps can grow in many places, including the large intestine (colon). A polyp may be a round bump or a mushroom-shaped growth. You could have one polyp or several. Most colon polyps are noncancerous (benign). However, some colon polyps can become cancerous over time. Finding and removing the polyps early can help prevent this. What are the causes? The exact cause of colon polyps is not known. What increases the risk? You are more likely to develop  this condition if you:  Have a family history of colon cancer or colon polyps.  Are older than 15 or older than 45 if you are African American.  Have inflammatory bowel disease, such as ulcerative colitis or Crohn's disease.  Have certain hereditary conditions, such as: ? Familial adenomatous polyposis. ? Lynch syndrome. ? Turcot syndrome. ? Peutz-Jeghers syndrome.  Are overweight.  Smoke cigarettes.  Do not get enough exercise.  Drink too much alcohol.  Eat a diet that is high in fat and red meat and low in fiber.  Had childhood cancer that was treated with abdominal radiation. What are the signs or symptoms? Most polyps do not cause symptoms. If you have symptoms, they may include:  Blood coming from your rectum when having a bowel movement.  Blood in your stool. The stool may look dark red or black.  Abdominal  pain.  A change in bowel habits, such as constipation or diarrhea. How is this diagnosed? This condition is diagnosed with a colonoscopy. This is a procedure in which a lighted, flexible scope is inserted into the anus and then passed into the colon to examine the area. Polyps are sometimes found when a colonoscopy is done as part of routine cancer screening tests. How is this treated? Treatment for this condition involves removing any polyps that are found. Most polyps can be removed during a colonoscopy. Those polyps will then be tested for cancer. Additional treatment may be needed depending on the results of testing. Follow these instructions at home: Lifestyle  Maintain a healthy weight, or lose weight if recommended by your health care provider.  Exercise every day or as told by your health care provider.  Do not use any products that contain nicotine or tobacco, such as cigarettes and e-cigarettes. If you need help quitting, ask your health care provider.  If you drink alcohol, limit how much you have: ? 0-1 drink a day for women. ? 0-2 drinks a day for men.  Be aware of how much alcohol is in your drink. In the [place] one drink equals one 12 oz bottle of beer (355 mL), one 5 oz glass of wine (148 mL), or one 1 oz shot of hard liquor (44 mL). Eating and drinking   Eat foods that are high in fiber, such as fruits, vegetables, and whole grains.  Eat foods that are high in calcium and vitamin D, such as milk, cheese, yogurt, eggs, liver, fish, and broccoli.  Limit foods that are high in fat, such as fried foods and desserts.  Limit the amount of red meat and processed meat you eat, such as hot dogs, sausage, bacon, and lunch meats. General instructions  Keep all follow-up visits as told by your health care provider. This is important. ? This includes having regularly scheduled colonoscopies. ? Talk to your health care provider about when you need a colonoscopy. Contact a  health care provider if:  You have new or worsening bleeding during a bowel movement.  You have new or increased blood in your stool.  You have a change in bowel habits.  You lose weight for no known reason. Summary  Polyps are tissue growths inside the body. Polyps can grow in many places, including the colon.  Most colon polyps are noncancerous (benign), but some can become cancerous over time.  This condition is diagnosed with a colonoscopy.  Treatment for this condition involves removing any polyps that are found. Most polyps can be removed during a colonoscopy. This information  is not intended to replace advice given to you by your health care provider. Make sure you discuss any questions you have with your health care provider. Document Released: 02/17/2004 Document Revised: 09/07/2017 Document Reviewed: 09/07/2017 Elsevier Interactive Patient Education  2019 Elsevier Inc.  Diverticulosis and colon polyp information provided  Further recommendations to follow pending review of pathology report

## 2018-07-12 ENCOUNTER — Encounter: Payer: Self-pay | Admitting: Internal Medicine

## 2018-07-13 ENCOUNTER — Encounter (HOSPITAL_COMMUNITY): Payer: Self-pay | Admitting: Internal Medicine

## 2018-08-20 ENCOUNTER — Other Ambulatory Visit: Payer: Self-pay

## 2018-08-20 ENCOUNTER — Ambulatory Visit: Payer: Self-pay | Admitting: *Deleted

## 2018-08-20 VITALS — BP 130/86 | Ht 70.5 in | Wt 217.0 lb

## 2018-08-20 DIAGNOSIS — Z0000 Encounter for general adult medical examination without abnormal findings: Secondary | ICD-10-CM

## 2018-08-20 NOTE — Progress Notes (Signed)
Be Well insurance premium discount evaluation: Labs Drawn. Re[employer] ROI form signed. Tobacco Free Attestation form signed.  Forms placed in paper chart. Okay to route results to pcp per pt. 

## 2018-08-22 LAB — CMP12+LP+TP+TSH+6AC+CBC/D/PLT
ALT: 16 IU/L (ref 0–32)
AST: 17 IU/L (ref 0–40)
Albumin/Globulin Ratio: 1.6 (ref 1.2–2.2)
Albumin: 4.1 g/dL (ref 3.8–4.8)
Alkaline Phosphatase: 65 IU/L (ref 39–117)
BUN/Creatinine Ratio: 15 (ref 12–28)
BUN: 20 mg/dL (ref 8–27)
Basophils Absolute: 0.1 10*3/uL (ref 0.0–0.2)
Basos: 1 %
Bilirubin Total: 0.4 mg/dL (ref 0.0–1.2)
Calcium: 9.7 mg/dL (ref 8.7–10.3)
Chloride: 108 mmol/L — ABNORMAL HIGH (ref 96–106)
Chol/HDL Ratio: 3.2 ratio (ref 0.0–4.4)
Cholesterol, Total: 212 mg/dL — ABNORMAL HIGH (ref 100–199)
Creatinine, Ser: 1.32 mg/dL — ABNORMAL HIGH (ref 0.57–1.00)
EOS (ABSOLUTE): 0.2 10*3/uL (ref 0.0–0.4)
Eos: 3 %
Free Thyroxine Index: 1.5 (ref 1.2–4.9)
GFR calc Af Amer: 47 mL/min/{1.73_m2} — ABNORMAL LOW (ref >59)
GFR calc non Af Amer: 41 mL/min/{1.73_m2} — ABNORMAL LOW (ref >59)
GGT: 7 IU/L (ref 0–60)
Globulin, Total: 2.5 g/dL (ref 1.5–4.5)
Glucose: 105 mg/dL — ABNORMAL HIGH (ref 65–99)
HDL: 66 mg/dL (ref >39)
Hematocrit: 37.9 % (ref 34.0–46.6)
Hemoglobin: 12.4 g/dL (ref 11.1–15.9)
IMMATURE GRANS (ABS): 0 10*3/uL (ref 0.0–0.1)
Immature Granulocytes: 0 %
Iron: 103 ug/dL (ref 27–139)
LDH: 195 IU/L (ref 119–226)
LDL Calculated: 120 mg/dL — ABNORMAL HIGH (ref 0–99)
LYMPHS: 31 %
Lymphocytes Absolute: 2.3 10*3/uL (ref 0.7–3.1)
MCH: 31.2 pg (ref 26.6–33.0)
MCHC: 32.7 g/dL (ref 31.5–35.7)
MCV: 96 fL (ref 79–97)
Monocytes Absolute: 0.6 10*3/uL (ref 0.1–0.9)
Monocytes: 8 %
Neutrophils Absolute: 4.3 10*3/uL (ref 1.4–7.0)
Neutrophils: 57 %
Phosphorus: 3.3 mg/dL (ref 3.0–4.3)
Platelets: 277 10*3/uL (ref 150–450)
Potassium: 4.5 mmol/L (ref 3.5–5.2)
RBC: 3.97 x10E6/uL (ref 3.77–5.28)
RDW: 13.3 % (ref 11.7–15.4)
Sodium: 143 mmol/L (ref 134–144)
T3 Uptake Ratio: 23 % — ABNORMAL LOW (ref 24–39)
T4, Total: 6.7 ug/dL (ref 4.5–12.0)
TSH: 2.05 u[IU]/mL (ref 0.450–4.500)
Total Protein: 6.6 g/dL (ref 6.0–8.5)
Triglycerides: 132 mg/dL (ref 0–149)
Uric Acid: 4.6 mg/dL (ref 2.5–7.1)
VLDL Cholesterol Cal: 26 mg/dL (ref 5–40)
WBC: 7.5 10*3/uL (ref 3.4–10.8)

## 2018-08-22 LAB — HGB A1C W/O EAG: Hgb A1c MFr Bld: 5.3 % (ref 4.8–5.6)

## 2018-08-24 NOTE — Progress Notes (Signed)
noted 

## 2019-03-07 ENCOUNTER — Other Ambulatory Visit: Payer: Self-pay | Admitting: Internal Medicine

## 2019-03-07 DIAGNOSIS — Z1231 Encounter for screening mammogram for malignant neoplasm of breast: Secondary | ICD-10-CM

## 2019-04-22 ENCOUNTER — Ambulatory Visit
Admission: RE | Admit: 2019-04-22 | Discharge: 2019-04-22 | Disposition: A | Discharge status: Home / Self Care | Payer: PRIVATE HEALTH INSURANCE | Source: Ambulatory Visit | Attending: Internal Medicine | Admitting: Internal Medicine

## 2019-04-22 ENCOUNTER — Other Ambulatory Visit: Payer: Self-pay

## 2019-04-22 DIAGNOSIS — Z1231 Encounter for screening mammogram for malignant neoplasm of breast: Secondary | ICD-10-CM

## 2019-07-28 ENCOUNTER — Ambulatory Visit: Payer: PRIVATE HEALTH INSURANCE | Attending: Internal Medicine

## 2019-07-28 DIAGNOSIS — Z23 Encounter for immunization: Secondary | ICD-10-CM | POA: Insufficient documentation

## 2019-07-28 NOTE — Progress Notes (Signed)
   Covid-19 Vaccination Clinic  Name:  Angelle Ferraiolo    MRN: [id number] DOB: [birth year]  07/28/2019  Ms. Rhines was observed post Covid-19 immunization for 15 minutes without incidence. She was provided with Vaccine Information Sheet and instruction to access the V-Safe system.   Ms. Cowles was instructed to call 911 with any severe reactions post vaccine: Marland Kitchen Difficulty breathing  . Swelling of your face and throat  . A fast heartbeat  . A bad rash all over your body  . Dizziness and weakness    Immunizations Administered    Name Date Dose VIS Date Route   Pfizer COVID-19 Vaccine 07/28/2019  2:08 PM 0.3 mL 05/17/2019 Intramuscular   Manufacturer: [place]   Lot: [id number]   [place]: [id number]

## 2019-08-21 ENCOUNTER — Ambulatory Visit: Payer: PRIVATE HEALTH INSURANCE | Attending: Internal Medicine

## 2019-08-21 DIAGNOSIS — Z23 Encounter for immunization: Secondary | ICD-10-CM

## 2019-08-21 NOTE — Progress Notes (Signed)
   Covid-19 Vaccination Clinic  Name:  Catherine Atkins    MRN: [id number] DOB: [birth year]  08/21/2019  [name] was observed post Covid-19 immunization for 15 minutes without incident. She was provided with Vaccine Information Sheet and instruction to access the V-Safe system.   [name] was instructed to call 911 with any severe reactions post vaccine: Marland Kitchen Difficulty breathing  . Swelling of face and throat  . A fast heartbeat  . A bad rash all over body  . Dizziness and weakness   Immunizations Administered    Name Date Dose VIS Date Route   Pfizer COVID-19 Vaccine 08/21/2019  2:42 PM 0.3 mL 05/17/2019 Intramuscular   Manufacturer: [place]   Lot: [id number]   [place]: [id number]      Covid-19 Vaccination Clinic  Name:  Catherine Atkins    MRN: [id number] DOB: 01-22-1951  08/21/2019  [name] was observed post Covid-19 immunization for 15 minutes without incident. She was provided with Vaccine Information Sheet and instruction to access the V-Safe system.   [name] was instructed to call 911 with any severe reactions post vaccine: Marland Kitchen Difficulty breathing  . Swelling of face and throat  . A fast heartbeat  . A bad rash all over body  . Dizziness and weakness   Immunizations Administered    Name Date Dose VIS Date Route   Pfizer COVID-19 Vaccine 08/21/2019  2:42 PM 0.3 mL 05/17/2019 Intramuscular   Manufacturer: [place]   Lot: [id number]   [place]: [id number]

## 2020-01-15 ENCOUNTER — Encounter: Payer: Self-pay | Admitting: Genetic Counselor

## 2020-04-01 ENCOUNTER — Other Ambulatory Visit: Payer: Self-pay | Admitting: Internal Medicine

## 2020-04-01 DIAGNOSIS — Z1231 Encounter for screening mammogram for malignant neoplasm of breast: Secondary | ICD-10-CM

## 2020-04-13 ENCOUNTER — Other Ambulatory Visit: Payer: Self-pay

## 2020-04-13 ENCOUNTER — Ambulatory Visit: Payer: Self-pay | Admitting: *Deleted

## 2020-04-13 DIAGNOSIS — Z0000 Encounter for general adult medical examination without abnormal findings: Secondary | ICD-10-CM

## 2020-04-13 NOTE — Progress Notes (Signed)
Pt brought orders from pcp for CBC no diff and lipid panel. Pt fasting today. Under standing orders ahead of anticipated restart of Be Well program and lab requirements, added A1c, and due to decreased kidney function results previously, added BMET.

## 2020-04-14 LAB — CBC WITH DIFFERENTIAL/PLATELET
Basophils Absolute: 0.1 10*3/uL (ref 0.0–0.2)
Basos: 1 %
EOS (ABSOLUTE): 0.2 10*3/uL (ref 0.0–0.4)
Eos: 2 %
Hematocrit: 38.6 % (ref 34.0–46.6)
Hemoglobin: 13.3 g/dL (ref 11.1–15.9)
Immature Grans (Abs): 0 10*3/uL (ref 0.0–0.1)
Immature Granulocytes: 0 %
Lymphocytes Absolute: 3 10*3/uL (ref 0.7–3.1)
Lymphs: 45 %
MCH: 32.4 pg (ref 26.6–33.0)
MCHC: 34.5 g/dL (ref 31.5–35.7)
MCV: 94 fL (ref 79–97)
Monocytes Absolute: 0.5 10*3/uL (ref 0.1–0.9)
Monocytes: 8 %
Neutrophils Absolute: 3 10*3/uL (ref 1.4–7.0)
Neutrophils: 44 %
Platelets: 283 10*3/uL (ref 150–450)
RBC: 4.11 x10E6/uL (ref 3.77–5.28)
RDW: 13.3 % (ref 11.7–15.4)
WBC: 6.7 10*3/uL (ref 3.4–10.8)

## 2020-04-14 LAB — BASIC METABOLIC PANEL
BUN/Creatinine Ratio: 17 (ref 12–28)
BUN: 24 mg/dL (ref 8–27)
CO2: 25 mmol/L (ref 20–29)
Calcium: 9.8 mg/dL (ref 8.7–10.3)
Chloride: 107 mmol/L — ABNORMAL HIGH (ref 96–106)
Creatinine, Ser: 1.4 mg/dL — ABNORMAL HIGH (ref 0.57–1.00)
GFR calc Af Amer: 44 mL/min/{1.73_m2} — ABNORMAL LOW (ref >59)
GFR calc non Af Amer: 38 mL/min/{1.73_m2} — ABNORMAL LOW (ref >59)
Glucose: 113 mg/dL — ABNORMAL HIGH (ref 65–99)
Potassium: 4.5 mmol/L (ref 3.5–5.2)
Sodium: 145 mmol/L — ABNORMAL HIGH (ref 134–144)

## 2020-04-14 LAB — LIPID PANEL
Chol/HDL Ratio: 3.9 ratio (ref 0.0–4.4)
Cholesterol, Total: 229 mg/dL — ABNORMAL HIGH (ref 100–199)
HDL: 58 mg/dL (ref >39)
LDL Chol Calc (NIH): 149 mg/dL — ABNORMAL HIGH (ref 0–99)
Triglycerides: 124 mg/dL (ref 0–149)
VLDL Cholesterol Cal: 22 mg/dL (ref 5–40)

## 2020-04-14 LAB — HGB A1C W/O EAG: Hgb A1c MFr Bld: 5.8 % — ABNORMAL HIGH (ref 4.8–5.6)

## 2020-04-14 NOTE — Progress Notes (Signed)
noted 

## 2020-04-14 NOTE — Progress Notes (Signed)
Pt has viewed results and notes in [place]. Called pt to discuss results further if needed. No answer. LVMRCB. Results routed to pcp per pt request.

## 2020-04-20 NOTE — Progress Notes (Signed)
Noted PCM reviewed results with patient

## 2020-04-20 NOTE — Progress Notes (Signed)
Pt emailed RN on Friday 11/12 stating "I saw the information in [place]. My doctor has already called me and gone over the blood work with me." No further questions/needs.

## 2020-05-07 ENCOUNTER — Ambulatory Visit
Admission: RE | Admit: 2020-05-07 | Discharge: 2020-05-07 | Disposition: A | Discharge status: Home / Self Care | Payer: PRIVATE HEALTH INSURANCE | Source: Ambulatory Visit | Attending: Internal Medicine | Admitting: Internal Medicine

## 2020-05-07 ENCOUNTER — Other Ambulatory Visit: Payer: Self-pay

## 2020-05-07 DIAGNOSIS — Z1231 Encounter for screening mammogram for malignant neoplasm of breast: Secondary | ICD-10-CM

## 2020-07-27 ENCOUNTER — Other Ambulatory Visit: Payer: Self-pay

## 2020-07-27 ENCOUNTER — Ambulatory Visit: Payer: Self-pay | Admitting: *Deleted

## 2020-07-27 VITALS — BP 105/66 | HR 65 | Ht 69.5 in | Wt 230.0 lb

## 2020-07-27 DIAGNOSIS — Z0000 Encounter for general adult medical examination without abnormal findings: Secondary | ICD-10-CM

## 2020-07-27 NOTE — Progress Notes (Signed)
Be Well insurance premium discount evaluation: Labs Drawn PCP office 04/13/2020 Repl[employer] ROI form signed. Tobacco Free Attestation form signed.  Forms placed in paper chart.

## 2021-04-19 ENCOUNTER — Ambulatory Visit: Payer: Self-pay | Admitting: *Deleted

## 2021-04-19 ENCOUNTER — Other Ambulatory Visit: Payer: Self-pay

## 2021-04-19 DIAGNOSIS — R7301 Impaired fasting glucose: Secondary | ICD-10-CM

## 2021-04-19 DIAGNOSIS — Z0000 Encounter for general adult medical examination without abnormal findings: Secondary | ICD-10-CM

## 2021-04-19 DIAGNOSIS — I10 Essential (primary) hypertension: Secondary | ICD-10-CM

## 2021-04-19 DIAGNOSIS — G43909 Migraine, unspecified, not intractable, without status migrainosus: Secondary | ICD-10-CM

## 2021-04-19 DIAGNOSIS — E785 Hyperlipidemia, unspecified: Secondary | ICD-10-CM

## 2021-04-19 NOTE — Progress Notes (Signed)
Fasting labs per pcp orders in paper chart.

## 2021-04-20 ENCOUNTER — Encounter: Payer: Self-pay | Admitting: Registered Nurse

## 2021-04-20 LAB — URINALYSIS, ROUTINE W REFLEX MICROSCOPIC
Bilirubin, UA: NEGATIVE
Glucose, UA: NEGATIVE
Ketones, UA: NEGATIVE
Leukocytes,UA: NEGATIVE
Nitrite, UA: NEGATIVE
Protein,UA: NEGATIVE
RBC, UA: NEGATIVE
Specific Gravity, UA: 1.019 (ref 1.005–1.030)
Urobilinogen, Ur: 0.2 mg/dL (ref 0.2–1.0)
pH, UA: 5.5 (ref 5.0–7.5)

## 2021-04-20 LAB — SPECIMEN STATUS REPORT

## 2021-04-24 LAB — CMP12+LP+TP+TSH+6AC+CBC/D/PLT
ALT: 22 IU/L (ref 0–32)
AST: 25 IU/L (ref 0–40)
Albumin/Globulin Ratio: 1.5 (ref 1.2–2.2)
Albumin: 4.4 g/dL (ref 3.7–4.7)
Alkaline Phosphatase: 69 IU/L (ref 44–121)
BUN/Creatinine Ratio: 18 (ref 12–28)
BUN: 25 mg/dL (ref 8–27)
Basophils Absolute: 0.1 10*3/uL (ref 0.0–0.2)
Basos: 1 %
Bilirubin Total: 0.4 mg/dL (ref 0.0–1.2)
Calcium: 9.8 mg/dL (ref 8.7–10.3)
Chloride: 103 mmol/L (ref 96–106)
Chol/HDL Ratio: 4.7 ratio — ABNORMAL HIGH (ref 0.0–4.4)
Cholesterol, Total: 246 mg/dL — ABNORMAL HIGH (ref 100–199)
Creatinine, Ser: 1.37 mg/dL — ABNORMAL HIGH (ref 0.57–1.00)
EOS (ABSOLUTE): 0.4 10*3/uL (ref 0.0–0.4)
Eos: 5 %
Estimated CHD Risk: 1.2 times avg. — ABNORMAL HIGH (ref 0.0–1.0)
Free Thyroxine Index: 1.8 (ref 1.2–4.9)
GGT: 12 IU/L (ref 0–60)
Globulin, Total: 3 g/dL (ref 1.5–4.5)
HDL: 52 mg/dL (ref >39)
Hematocrit: 38.3 % (ref 34.0–46.6)
Hemoglobin: 13 g/dL (ref 11.1–15.9)
Immature Grans (Abs): 0 10*3/uL (ref 0.0–0.1)
Immature Granulocytes: 0 %
Iron: 80 ug/dL (ref 27–139)
LDH: 194 IU/L (ref 119–226)
LDL Chol Calc (NIH): 161 mg/dL — ABNORMAL HIGH (ref 0–99)
Lymphocytes Absolute: 2.8 10*3/uL (ref 0.7–3.1)
Lymphs: 34 %
MCH: 31.6 pg (ref 26.6–33.0)
MCHC: 33.9 g/dL (ref 31.5–35.7)
MCV: 93 fL (ref 79–97)
Monocytes Absolute: 0.7 10*3/uL (ref 0.1–0.9)
Monocytes: 8 %
Neutrophils Absolute: 4.3 10*3/uL (ref 1.4–7.0)
Neutrophils: 52 %
Platelets: 276 10*3/uL (ref 150–450)
RBC: 4.12 x10E6/uL (ref 3.77–5.28)
RDW: 13.1 % (ref 11.7–15.4)
Sodium: 140 mmol/L (ref 134–144)
T3 Uptake Ratio: 25 % (ref 24–39)
T4, Total: 7.1 ug/dL (ref 4.5–12.0)
TSH: 2.62 u[IU]/mL (ref 0.450–4.500)
Total Protein: 7.4 g/dL (ref 6.0–8.5)
Triglycerides: 180 mg/dL — ABNORMAL HIGH (ref 0–149)
Uric Acid: 5.7 mg/dL (ref 3.1–7.9)
VLDL Cholesterol Cal: 33 mg/dL (ref 5–40)
WBC: 8.3 10*3/uL (ref 3.4–10.8)
eGFR: 41 mL/min/{1.73_m2} — ABNORMAL LOW (ref >59)

## 2021-04-24 LAB — HGB A1C W/O EAG: Hgb A1c MFr Bld: 5.7 % — ABNORMAL HIGH (ref 4.8–5.6)

## 2021-04-24 LAB — PARATHYROID HORMONE, INTACT (NO CA): PTH: 20 pg/mL (ref 15–65)

## 2021-04-24 LAB — MAGNESIUM: Magnesium: 2.3 mg/dL (ref 1.6–2.3)

## 2021-04-24 LAB — VITAMIN D 25 HYDROXY (VIT D DEFICIENCY, FRACTURES): Vit D, 25-Hydroxy: 21.1 ng/mL — ABNORMAL LOW (ref 30.0–100.0)

## 2021-04-26 NOTE — Progress Notes (Signed)
Remainder of labs resulted, PTH and A1c, routed to pcp.

## 2021-05-06 NOTE — Progress Notes (Signed)
Pt does not appear to have viewed results/notes in MyChart. Unable to reach pt by phone. LVM advising results available and to review and call RN if any questions or concerns.

## 2021-05-10 NOTE — Progress Notes (Signed)
Pt emailed RN advising that she has seen the results in [place] and has an appt with her MD tomorrow. Denies questions currently as she says "I'm sure he will go over it in much detail." Encouraged to contact clinic with any further needs or concerns.

## 2021-05-10 NOTE — Progress Notes (Signed)
Reviewed RN Hildred Alamin note patient has follow up with [facility] tomorrow and has reviewed results in epic

## 2021-06-08 ENCOUNTER — Other Ambulatory Visit: Payer: Self-pay | Admitting: Internal Medicine

## 2021-06-08 DIAGNOSIS — Z1231 Encounter for screening mammogram for malignant neoplasm of breast: Secondary | ICD-10-CM

## 2021-06-09 ENCOUNTER — Other Ambulatory Visit: Payer: Self-pay

## 2021-06-09 ENCOUNTER — Ambulatory Visit
Admission: RE | Admit: 2021-06-09 | Discharge: 2021-06-09 | Disposition: A | Discharge status: Home / Self Care | Payer: No Typology Code available for payment source | Source: Ambulatory Visit | Attending: Internal Medicine | Admitting: Internal Medicine

## 2021-06-09 DIAGNOSIS — Z1231 Encounter for screening mammogram for malignant neoplasm of breast: Secondary | ICD-10-CM

## 2021-07-23 ENCOUNTER — Other Ambulatory Visit: Payer: Self-pay

## 2021-07-23 ENCOUNTER — Ambulatory Visit: Payer: Self-pay | Admitting: *Deleted

## 2021-07-23 VITALS — BP 130/78 | HR 61 | Wt 239.0 lb

## 2021-07-23 DIAGNOSIS — R7303 Prediabetes: Secondary | ICD-10-CM

## 2021-07-23 DIAGNOSIS — N189 Chronic kidney disease, unspecified: Secondary | ICD-10-CM

## 2021-07-23 DIAGNOSIS — I10 Essential (primary) hypertension: Secondary | ICD-10-CM

## 2021-07-23 DIAGNOSIS — Z0000 Encounter for general adult medical examination without abnormal findings: Secondary | ICD-10-CM

## 2021-07-23 DIAGNOSIS — E559 Vitamin D deficiency, unspecified: Secondary | ICD-10-CM

## 2021-07-23 NOTE — Progress Notes (Signed)
Be Well insurance premium discount evaluation: Labs Drawn. Replacements ROI form signed. Tobacco Free Attestation form signed.  Forms placed in paper chart.  Fasting labs redrawn from Nov 22 per pcp orders.

## 2021-07-24 ENCOUNTER — Encounter: Payer: Self-pay | Admitting: Registered Nurse

## 2021-07-24 LAB — CMP12+LP+TP+TSH+6AC+CBC/D/PLT
ALT: 28 IU/L (ref 0–32)
AST: 32 IU/L (ref 0–40)
Albumin/Globulin Ratio: 1.6 (ref 1.2–2.2)
Albumin: 4.5 g/dL (ref 3.7–4.7)
Alkaline Phosphatase: 68 IU/L (ref 44–121)
BUN/Creatinine Ratio: 15 (ref 12–28)
BUN: 20 mg/dL (ref 8–27)
Basophils Absolute: 0.1 10*3/uL (ref 0.0–0.2)
Basos: 1 %
Bilirubin Total: 0.5 mg/dL (ref 0.0–1.2)
Calcium: 10 mg/dL (ref 8.7–10.3)
Chloride: 103 mmol/L (ref 96–106)
Chol/HDL Ratio: 4.1 ratio (ref 0.0–4.4)
Cholesterol, Total: 227 mg/dL — ABNORMAL HIGH (ref 100–199)
Creatinine, Ser: 1.32 mg/dL — ABNORMAL HIGH (ref 0.57–1.00)
EOS (ABSOLUTE): 0.2 10*3/uL (ref 0.0–0.4)
Eos: 3 %
Estimated CHD Risk: 0.9 times avg. (ref 0.0–1.0)
Free Thyroxine Index: 1.7 (ref 1.2–4.9)
GGT: 10 IU/L (ref 0–60)
Globulin, Total: 2.8 g/dL (ref 1.5–4.5)
Glucose: 102 mg/dL — ABNORMAL HIGH (ref 70–99)
HDL: 56 mg/dL (ref >39)
Hematocrit: 37.8 % (ref 34.0–46.6)
Hemoglobin: 12.7 g/dL (ref 11.1–15.9)
Immature Grans (Abs): 0 10*3/uL (ref 0.0–0.1)
Immature Granulocytes: 0 %
Iron: 104 ug/dL (ref 27–139)
LDH: 215 IU/L (ref 119–226)
LDL Chol Calc (NIH): 143 mg/dL — ABNORMAL HIGH (ref 0–99)
Lymphocytes Absolute: 2.6 10*3/uL (ref 0.7–3.1)
Lymphs: 38 %
MCH: 31.7 pg (ref 26.6–33.0)
MCHC: 33.6 g/dL (ref 31.5–35.7)
MCV: 94 fL (ref 79–97)
Monocytes Absolute: 0.5 10*3/uL (ref 0.1–0.9)
Monocytes: 7 %
Neutrophils Absolute: 3.6 10*3/uL (ref 1.4–7.0)
Neutrophils: 51 %
Phosphorus: 3.5 mg/dL (ref 3.0–4.3)
Platelets: 286 10*3/uL (ref 150–450)
Potassium: 4.4 mmol/L (ref 3.5–5.2)
RBC: 4.01 x10E6/uL (ref 3.77–5.28)
RDW: 13.5 % (ref 11.7–15.4)
Sodium: 140 mmol/L (ref 134–144)
T3 Uptake Ratio: 24 % (ref 24–39)
T4, Total: 7.2 ug/dL (ref 4.5–12.0)
TSH: 2.26 u[IU]/mL (ref 0.450–4.500)
Total Protein: 7.3 g/dL (ref 6.0–8.5)
Triglycerides: 158 mg/dL — ABNORMAL HIGH (ref 0–149)
Uric Acid: 5.1 mg/dL (ref 3.1–7.9)
VLDL Cholesterol Cal: 28 mg/dL (ref 5–40)
WBC: 7 10*3/uL (ref 3.4–10.8)
eGFR: 43 mL/min/{1.73_m2} — ABNORMAL LOW (ref >59)

## 2021-07-24 LAB — HGB A1C W/O EAG: Hgb A1c MFr Bld: 5.8 % — ABNORMAL HIGH (ref 4.8–5.6)

## 2021-07-24 LAB — MAGNESIUM: Magnesium: 2.1 mg/dL (ref 1.6–2.3)

## 2021-07-24 NOTE — Addendum Note (Signed)
Addended by: Gerarda Fraction A on: 07/24/2021 03:52 PM   Modules accepted: Orders

## 2021-07-26 NOTE — Progress Notes (Signed)
Vitamin D added on with [facility].

## 2021-07-27 ENCOUNTER — Encounter: Payer: Self-pay | Admitting: Registered Nurse

## 2021-07-27 DIAGNOSIS — E559 Vitamin D deficiency, unspecified: Secondary | ICD-10-CM | POA: Insufficient documentation

## 2021-07-27 LAB — VITAMIN D 25 HYDROXY (VIT D DEFICIENCY, FRACTURES): Vit D, 25-Hydroxy: 23.7 ng/mL — ABNORMAL LOW (ref 30.0–100.0)

## 2021-07-27 LAB — SPECIMEN STATUS REPORT

## 2021-07-27 NOTE — Addendum Note (Signed)
Addended by: Gerarda Fraction A on: 07/27/2021 07:05 AM   Modules accepted: Orders

## 2021-08-05 MED ORDER — CHOLECALCIFEROL 1.25 MG (50000 UT) PO TABS: 1.0000 | ORAL_TABLET | ORAL | 0 refills | Status: AC

## 2021-12-27 ENCOUNTER — Other Ambulatory Visit: Payer: Self-pay | Admitting: Registered Nurse

## 2021-12-27 DIAGNOSIS — E559 Vitamin D deficiency, unspecified: Secondary | ICD-10-CM

## 2021-12-27 DIAGNOSIS — N189 Chronic kidney disease, unspecified: Secondary | ICD-10-CM

## 2021-12-27 DIAGNOSIS — R7303 Prediabetes: Secondary | ICD-10-CM

## 2021-12-28 ENCOUNTER — Encounter: Payer: Self-pay | Admitting: Registered Nurse

## 2021-12-28 LAB — BASIC METABOLIC PANEL
BUN/Creatinine Ratio: 16
BUN: 26 mg/dL
CO2: 20 mmol/L (ref 20–29)
Calcium: 10.1 mg/dL
Chloride: 107 mmol/L — ABNORMAL HIGH (ref 96–106)
Creatinine, Ser: 1.66 mg/dL
Glucose: 117 mg/dL — ABNORMAL HIGH (ref 70–99)
Potassium: 4.4 mmol/L (ref 3.5–5.2)
Sodium: 146 mmol/L — ABNORMAL HIGH (ref 134–144)
eGFR: 32 mL/min/{1.73_m2} — ABNORMAL LOW (ref >59)

## 2021-12-28 LAB — HGB A1C W/O EAG: Hgb A1c MFr Bld: 5.7 % — ABNORMAL HIGH (ref 4.8–5.6)

## 2021-12-28 LAB — VITAMIN D 25 HYDROXY (VIT D DEFICIENCY, FRACTURES): Vit D, 25-Hydroxy: 34.1 ng/mL (ref 30.0–100.0)

## 2021-12-28 MED ORDER — CHOLECALCIFEROL 1.25 MG (50000 UT) PO TABS: 1.0000 | ORAL_TABLET | ORAL | 0 refills | Status: AC

## 2021-12-28 NOTE — Addendum Note (Signed)
Addended by: Gerarda Fraction A on: 12/28/2021 08:01 PM   Modules accepted: Orders, Level of Service

## 2022-03-15 ENCOUNTER — Ambulatory Visit: Payer: Self-pay | Admitting: Registered Nurse

## 2022-03-15 ENCOUNTER — Encounter: Payer: Self-pay | Admitting: Registered Nurse

## 2022-03-15 VITALS — BP 122/79 | HR 68 | Temp 98.2°F | Resp 16

## 2022-03-15 DIAGNOSIS — M25561 Pain in right knee: Secondary | ICD-10-CM

## 2022-03-15 MED ORDER — DICLOFENAC SODIUM 1 % EX GEL: 4.0000 g | Freq: Four times a day (QID) | CUTANEOUS | 0 refills | Status: AC

## 2022-03-15 NOTE — Patient Instructions (Signed)
Acute Knee Pain, Adult Acute knee pain is sudden and may be caused by damage, swelling, or irritation of the muscles and tissues that support the knee. Pain may result from: A fall. An injury to the knee from twisting motions. A hit to the knee. Infection. Acute knee pain may go away on its own with time and rest. If it does not, your health care provider may order tests to find the cause of the pain. These may include: Imaging tests, such as an X-ray, MRI, CT scan, or ultrasound. Joint aspiration. In this test, fluid is removed from the knee and evaluated. Arthroscopy. In this test, a lighted tube is inserted into the knee and an image is projected onto a TV screen. Biopsy. In this test, a sample of tissue is removed from the body and studied under a microscope. Follow these instructions at home: If you have a knee sleeve or brace:  Wear the knee sleeve or brace as told by your health care provider. Remove it only as told by your health care provider. Loosen it if your toes tingle, become numb, or turn cold and blue. Keep it clean. If the knee sleeve or brace is not waterproof: Do not let it get wet. Cover it with a watertight covering when you take a bath or shower. Activity Rest your knee. Do not do things that cause pain or make pain worse. Avoid high-impact activities or exercises, such as running, jumping rope, or doing jumping jacks. Work with a physical therapist to make a safe exercise program, as recommended by your health care provider. Do exercises as told by your physical therapist. Managing pain, stiffness, and swelling  If directed, put ice on the affected knee. To do this: If you have a removable knee sleeve or brace, remove it as told by your health care provider. Put ice in a plastic bag. Place a towel between your skin and the bag. Leave the ice on for 20 minutes, 2-3 times a day. Remove the ice if your skin turns bright red. This is very important. If you cannot  feel pain, heat, or cold, you have a greater risk of damage to the area. If directed, use an elastic bandage to put pressure (compression) on your injured knee. This may control swelling, give support, and help with discomfort. Raise (elevate) your knee above the level of your heart while you are sitting or lying down. Sleep with a pillow under your knee. General instructions Take over-the-counter and prescription medicines only as told by your health care provider. Do not use any products that contain nicotine or tobacco, such as cigarettes, e-cigarettes, and chewing tobacco. If you need help quitting, ask your health care provider. If you are overweight, work with your health care provider and a dietitian to set a weight-loss goal that is healthy and reasonable for you. Extra weight can put pressure on your knee. Pay attention to any changes in your symptoms. Keep all follow-up visits. This is important. Contact a health care provider if: Your knee pain continues, changes, or gets worse. You have a fever along with knee pain. Your knee feels warm to the touch or is red. Your knee buckles or locks up. Get help right away if: Your knee swells, and the swelling becomes worse. You cannot move your knee. You have severe pain in your knee that cannot be managed with pain medicine. Summary Acute knee pain can be caused by a fall, an injury, an infection, or damage, swelling, or irritation   of the tissues that support your knee. Your health care provider may perform tests to find out the cause of the pain. Pay attention to any changes in your symptoms. Relieve your pain with rest, medicines, light activity, and the use of ice. Get help right away if your knee swells, you cannot move your knee, or you have severe pain that cannot be managed with medicine. This information is not intended to replace advice given to you by your health care provider. Make sure you discuss any questions you have with  your health care provider. Document Revised: 11/06/2019 Document Reviewed: 11/06/2019 Elsevier Patient Education  [place]. Osteoarthritis  Osteoarthritis is a type of arthritis. It refers to joint pain or joint disease. Osteoarthritis affects tissue that covers the ends of bones in joints (cartilage). Cartilage acts as a cushion between the bones and helps them move smoothly. Osteoarthritis occurs when cartilage in the joints gets worn down. Osteoarthritis is sometimes called "wear and tear" arthritis. Osteoarthritis is the most common form of arthritis. It often occurs in older people. It is a condition that gets worse over time. The joints most often affected by this condition are in the fingers, toes, hips, knees, and spine, including the neck and lower back. What are the causes? This condition is caused by the wearing down of cartilage that covers the ends of bones. What increases the risk? The following factors may make you more likely to develop this condition: Being age 73 or older. Obesity. Overuse of joints. Past injury of a joint. Past surgery on a joint. Family history of osteoarthritis. What are the signs or symptoms? The main symptoms of this condition are pain, swelling, and stiffness in the joint. Other symptoms may include: An enlarged joint. More pain and further damage caused by small pieces of bone or cartilage that break off and float inside of the joint. Small deposits of bone (osteophytes) that grow on the edges of the joint. A grating or scraping feeling inside the joint when you move it. Popping or creaking sounds when you move. Difficulty walking or exercising. An inability to grip items, twist your hand(s), or control the movements of your hands and fingers. How is this diagnosed? This condition may be diagnosed based on: Your medical history. A physical exam. Your symptoms. X-rays of the affected joint(s). Blood tests to rule out other types of  arthritis. How is this treated? There is no cure for this condition, but treatment can help control pain and improve joint function. Treatment may include a combination of therapies, such as: Pain relief techniques, such as: Applying heat and cold to the joint. Massage. A form of talk therapy called cognitive behavioral therapy (CBT). This therapy helps you set goals and follow up on the changes that you make. Medicines for pain and inflammation. The medicines can be taken by mouth or applied to the skin. They include: NSAIDs, such as ibuprofen. Prescription medicines. Strong anti-inflammatory medicines (corticosteroids). Certain nutritional supplements. A prescribed exercise program. You may work with a physical therapist. Assistive devices, such as a brace, wrap, splint, specialized glove, or cane. A weight control plan. Surgery, such as: An osteotomy. This is done to reposition the bones and relieve pain or to remove loose pieces of bone and cartilage. Joint replacement surgery. You may need this surgery if you have advanced osteoarthritis. Follow these instructions at home: Activity Rest your affected joints as told by your health care provider. Exercise as told by your health care provider. He  or she may recommend specific types of exercise, such as: Strengthening exercises. These are done to strengthen the muscles that support joints affected by arthritis. Aerobic activities. These are exercises, such as brisk walking or water aerobics, that increase your heart rate. Range-of-motion activities. These help your joints move more easily. Balance and agility exercises. Managing pain, stiffness, and swelling     If directed, apply heat to the affected area as often as told by your health care provider. Use the heat source that your health care provider recommends, such as a moist heat pack or a heating pad. If you have a removable assistive device, remove it as told by your health  care provider. Place a towel between your skin and the heat source. If your health care provider tells you to keep the assistive device on while you apply heat, place a towel between the assistive device and the heat source. Leave the heat on for 20-30 minutes. Remove the heat if your skin turns bright red. This is especially important if you are unable to feel pain, heat, or cold. You may have a greater risk of getting burned. If directed, put ice on the affected area. To do this: If you have a removable assistive device, remove it as told by your health care provider. Put ice in a plastic bag. Place a towel between your skin and the bag. If your health care provider tells you to keep the assistive device on during icing, place a towel between the assistive device and the bag. Leave the ice on for 20 minutes, 2-3 times a day. Move your fingers or toes often to reduce stiffness and swelling. Raise (elevate) the injured area above the level of your heart while you are sitting or lying down. General instructions Take over-the-counter and prescription medicines only as told by your health care provider. Maintain a healthy weight. Follow instructions from your health care provider for weight control. Do not use any products that contain nicotine or tobacco, such as cigarettes, e-cigarettes, and chewing tobacco. If you need help quitting, ask your health care provider. Use assistive devices as told by your health care provider. Keep all follow-up visits as told by your health care provider. This is important. Where to find more information [employer] of Arthritis and Musculoskeletal and Skin Diseases: www.niams.SouthExposed.es [employer] on Aging: http://kim-miller.com/ American College of Rheumatology: www.rheumatology.org Contact a health care provider if: You have redness, swelling, or a feeling of warmth in a joint that gets worse. You have a fever along with joint or muscle aches. You  develop a rash. You have trouble doing your normal activities. Get help right away if: You have pain that gets worse and is not relieved by pain medicine. Summary Osteoarthritis is a type of arthritis that affects tissue covering the ends of bones in joints (cartilage). This condition is caused by the wearing down of cartilage that covers the ends of bones. The main symptom of this condition is pain, swelling, and stiffness in the joint. There is no cure for this condition, but treatment can help control pain and improve joint function. This information is not intended to replace advice given to you by your health care provider. Make sure you discuss any questions you have with your health care provider. Document Revised: 05/20/2019 Document Reviewed: 05/20/2019 Elsevier Patient Education  [place]. Abrasion An abrasion is a cut or a scrape on the surface of the skin. An abrasion does not go through all the layers of  the skin. It is important to care for an abrasion properly to prevent infection. What are the causes? This condition is caused by rubbing your skin on something or falling on a surface, such as the ground. When your skin rubs on something, some layers of skin may rub off. What are the signs or symptoms? The main symptom of this condition is a cut or a scrape. The cut or scrape may be bleeding, or it may appear red or pink. If your abrasion was caused by a fall, there may be a bruise under your cut or scrape. How is this diagnosed? An abrasion is diagnosed with a physical exam. How is this treated? Treatment for this condition depends on how large and deep the abrasion is. In most cases: Your abrasion will be cleaned with water and mild soap. This is done to remove any dirt or debris (such as tiny bits of glass or rock) that may be stuck in your wound. An antibiotic ointment may be applied to your abrasion to help prevent infection. A bandage (dressing) may be placed on  your abrasion to keep it clean. You may also need a tetanus shot. Follow these instructions at home: Medicines Take or apply over-the-counter and prescription medicines only as told by your health care provider. If you were prescribed an antibiotic medicine, use it as told by your health care provider. Do not stop using the antibiotic even if you start to feel better. Wound care Clean your wound 1 or 2 times a day, or as told by your health care provider. To do this: Wash your hands for at least 20 seconds with mild soap and water. Do this before and after you clean your wound. Wash your wound with mild soap and water and then rinse off the soap. Pat your wound dry with a clean towel. Do not rub your wound. Keep your dressing clean and dry as told by your health care provider. There are many different ways to close and cover a wound. Follow instructions from your health care provider about caring for your wound and about changing and removing your dressing. You may have to change your dressing one or more times a day, or as directed by your health care provider. Check your wound every day for signs of infection. Check for: Redness, especially a red streak that spreads out from your wound. Swelling or increased pain. Warmth. Blood, fluid, pus, or a bad smell. Managing pain and swelling  If directed, put ice on the injured area. To do this: Put ice in a plastic bag. Place a towel between your skin and the bag. Leave the ice on for 20 minutes, 2-3 times a day. Remove the ice if your skin turns bright red. This is very important. If you cannot feel pain, heat, or cold, you have a greater risk of damage to the area. If possible, raise (elevate) the injured area above the level of your heart while you are sitting or lying down. General instructions Do not take baths, swim, or use a hot tub until your health care provider approves. Ask your doctor about taking showers or sponge baths. Keep all  follow-up visits. This is important. Contact a health care provider if: You received a tetanus shot, and you have swelling, severe pain, redness, or bleeding at your injection site. Your pain is not controlled with medicine. You have a fever. You have any of these signs of infection: Redness, swelling, or more pain around your wound. Warmth coming  from your wound. Blood, fluid, pus, or a bad smell coming from your wound. Get help right away if: You have a red streak spreading away from your wound. Summary An abrasion is a cut or a scrape on the surface of the skin. Care for your abrasion properly to prevent infection. Clean your wound with mild soap and water 1 or 2 times a day or as often as told. Follow instructions from your health care provider about taking medicines and changing your bandage (dressing). Contact your health care provider if you have a fever or if you have redness, swelling, or more pain around your wound. Contact your health care provider if you have warmth, blood, fluid, pus, or a bad smell coming from your wound. Get help right away if there is a red streak spreading away from your wound. This information is not intended to replace advice given to you by your health care provider. Make sure you discuss any questions you have with your health care provider. Document Revised: 08/22/2019 Document Reviewed: 08/22/2019 Elsevier Patient Education  [place].

## 2022-03-15 NOTE — Progress Notes (Signed)
  Subjective:     Patient ID: Catherine Atkins, female   DOB: 1950/07/04, 71 y.o.   MRN: [id number]  HPI   Review of Systems     Objective:   Physical Exam    Left knee 17  right knee 18.5" diameter over central patella Abrasions/dry skin noted removed bandaids Assessment:     ***    Plan:     ***   Continue neosporin; heat/ice/elevating/neoprene knee sleeve schedule follow up with her orthopedics provider for knee injection.  Trial voltaren gel 1% 4g topical right knee QID prn pain #50g RF0 sent to her pharmacy of choice.  Stop baby aspirin.  Follow up with me in 1 week re-evaluation/remeasure sooner if swelling worsening, red streaks, purulent discharge.

## 2022-03-21 ENCOUNTER — Telehealth: Payer: Self-pay | Admitting: Registered Nurse

## 2022-03-21 ENCOUNTER — Encounter: Payer: Self-pay | Admitting: Registered Nurse

## 2022-03-21 DIAGNOSIS — M25561 Pain in right knee: Secondary | ICD-10-CM

## 2022-03-21 NOTE — Telephone Encounter (Signed)
[facility] stated voltaren gel is helping with her knee pain along with other instructions.  Denied fever/chills/red streaks/worsening rash/swelling/pain.  Will follow up with [facility] and [facility] staff as needed new or worsening symptoms.  Patient agreed with plan of care and had no further questions at this time.

## 2022-04-04 NOTE — Telephone Encounter (Signed)
Patient reported slow improvement continues with knee pain/swelling.  She is continuing plan of care and had no further concerns or questions at this time.

## 2022-04-11 ENCOUNTER — Encounter: Payer: Self-pay | Admitting: Registered Nurse

## 2022-04-11 ENCOUNTER — Telehealth: Payer: Self-pay | Admitting: Registered Nurse

## 2022-04-11 DIAGNOSIS — E559 Vitamin D deficiency, unspecified: Secondary | ICD-10-CM

## 2022-04-11 DIAGNOSIS — R7303 Prediabetes: Secondary | ICD-10-CM

## 2022-04-11 NOTE — Telephone Encounter (Signed)
Patient has physical in December with [facility] and would like to have all labs drawn at that time.

## 2022-04-29 ENCOUNTER — Ambulatory Visit
Admission: EM | Admit: 2022-04-29 | Discharge: 2022-04-29 | Disposition: A | Discharge status: Home / Self Care | Payer: No Typology Code available for payment source | Attending: Physician Assistant | Admitting: Physician Assistant

## 2022-04-29 DIAGNOSIS — J069 Acute upper respiratory infection, unspecified: Secondary | ICD-10-CM | POA: Diagnosis present

## 2022-04-29 DIAGNOSIS — Z1152 Encounter for screening for COVID-19: Secondary | ICD-10-CM | POA: Insufficient documentation

## 2022-04-29 LAB — POCT INFLUENZA A/B
Influenza A, POC: NEGATIVE
Influenza B, POC: NEGATIVE

## 2022-04-29 NOTE — ED Triage Notes (Signed)
[facility] presents to [facility] with co of cough congestion anf fevers since yesterday. Pt is taking musinex and cough syrup

## 2022-04-29 NOTE — ED Provider Notes (Signed)
[facility]    CSN: [id number] Arrival date & time: 04/29/22  1038      History   Chief Complaint Chief Complaint  Patient presents with   URI    HPI Catherine Atkins is a 71 y.o. female.   Patient here today for evaluation of cough, congestion and fever that started yesterday. She has not measured temperature but states she assumed she had fever due to sweats. She has not had any vomiting or diarrhea. She has taken OTC meds without resolution.   The history is provided by the patient.  URI Presenting symptoms: congestion, cough, fever and sore throat   Presenting symptoms: no ear pain   Associated symptoms: no wheezing     Past Medical History:  Diagnosis Date   Breast cancer ([place]) 01/25/2012   left mastectomy   Dental crowns present    Depression    History of breast cancer 2013   Hypertension    states is under control with meds., has been on med. 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 Gala Romney: diverticulosis, diminutive rectosigmoid polyps removed/cold biopsy.   COLONOSCOPY WITH PROPOFOL N/A 07/09/2018   Procedure: COLONOSCOPY WITH PROPOFOL;  Surgeon: Daneil Dolin, MD;  [facility];  Service: Endoscopy;  Laterality: N/A;  2:00pm   DEBRIDEMENT AND CLOSURE WOUND Left 11/06/2015    Procedure: EXCISION OF LEFT CHEST CHRONIC SEROMA ;  Surgeon: Irene Limbo, MD;  Location: [facility];  Service: Plastics;  Laterality: Left;   MASTECTOMY Left 01/25/2012   no radiation no chemo   POLYPECTOMY  07/09/2018   Procedure: POLYPECTOMY;  Surgeon: Daneil Dolin, MD;  [facility]: [facility];  Service: Endoscopy;;  cecal polyps x3     OB History   No obstetric history on file.      Home Medications    Prior to Admission medications   Medication Sig Start Date End Date Taking? Authorizing Provider  acetaminophen (TYLENOL) 500 MG tablet Take 1,000 mg by mouth every 6 (six) hours as needed for moderate pain.    [provider]  amLODipine-benazepril (LOTREL) 10-40 MG capsule Take 1 capsule by mouth daily. 06/18/21   [provider]  atenolol (TENORMIN) 25 MG tablet Take 50 mg by mouth daily.     [provider]  buPROPion (WELLBUTRIN XL) 150 MG 24 hr tablet Take 450 mg by mouth daily.     [provider]  escitalopram (LEXAPRO) 5 MG tablet Take 5 mg by mouth daily.  03/01/18   [provider]  hydrochlorothiazide (HYDRODIURIL) 25 MG tablet Take 25 mg by mouth daily. 06/21/21   [provider]  Multiple Vitamin (MULTIVITAMIN WITH MINERALS) TABS tablet Take 1 tablet by mouth daily.    [provider]    Family History Family History  Problem Relation Age of Onset   Cancer Mother        breast   Breast cancer Mother 72   Leukemia Father    Cancer Sister        breast   Breast cancer Sister 90   Prostate cancer Maternal Uncle    Heart attack Brother    Colon cancer Neg Hx     Social History Social History   Tobacco Use   Smoking status: Never   Smokeless tobacco: Never  Vaping Use   Vaping Use: Never used  Substance Use Topics   Alcohol use: No   Drug use: No     Allergies   Codeine   Review of Systems Review of Systems  Constitutional:  Positive for diaphoresis and fever.   HENT:  Positive for congestion and sore throat. Negative for ear pain.   Eyes:  Negative for discharge and redness.  Respiratory:  Positive for cough. Negative for shortness of breath and wheezing.   Gastrointestinal:  Negative for abdominal pain, diarrhea, nausea and vomiting.     Physical Exam Triage Vital Signs ED Triage Vitals  Enc Vitals Group     BP 04/29/22 1207 105/61     Pulse Rate 04/29/22 1207 67     Resp 04/29/22 1207 19     Temp 04/29/22 1207 (!) 97.3 F (36.3 C)     Temp Source 04/29/22 1207 Oral     SpO2 04/29/22 1207 98 %     Weight --      Height --      Head Circumference --      Peak Flow --      Pain Score 04/29/22 1206 0     Pain Loc --      Pain Edu? --      Excl. in [place]? --    No data found.  Updated Vital Signs BP 105/61   Pulse 67   Temp (!) 97.3 F (36.3 C) (Oral)   Resp 19   SpO2 98%      Physical Exam Vitals and nursing note reviewed.  Constitutional:      General: She is not in acute distress.    Appearance: Normal appearance. She is not ill-appearing.  HENT:     Head: Normocephalic and atraumatic.     Nose: Congestion present.     Mouth/Throat:     Mouth: Mucous membranes are moist.     Pharynx: No oropharyngeal exudate or posterior oropharyngeal erythema.  Eyes:     Conjunctiva/sclera: Conjunctivae normal.  Cardiovascular:     Rate and Rhythm: Normal rate and regular rhythm.     Heart sounds: Normal heart sounds. No murmur heard. Pulmonary:     Effort: Pulmonary effort is normal. No respiratory distress.     Breath sounds: Normal breath sounds. No wheezing, rhonchi or rales.  Skin:    General: Skin is warm and dry.  Neurological:     Mental Status: She is alert.  Psychiatric:        Mood and Affect: Mood normal.        Thought Content: Thought content normal.      UC Treatments / Results  Labs (all labs ordered are listed, but only abnormal results are displayed) Labs Reviewed  SARS CORONAVIRUS 2 (TAT 6-24 HRS)   POCT INFLUENZA A/B    EKG   Radiology No results found.  Procedures Procedures (including critical care  time)  Medications Ordered in UC Medications - No data to display  Initial Impression / Assessment and Plan / UC Course  I have reviewed the triage vital signs and the nursing notes.  Pertinent labs & imaging results that were available during my care of the patient were reviewed by me and considered in my medical decision making (see chart for details).   Flu test negative in office. Will screen for covid. Suspect viral etiology of symptoms and recommended increased fluids and continued symptomatic treatment.    Final Clinical Impressions(s) / UC Diagnoses   Final diagnoses:  Acute upper respiratory infection  Encounter for screening for COVID-19   Discharge Instructions   None    ED Prescriptions   None    PDMP not reviewed this encounter.   Francene Finders, PA-C 04/29/22 1507

## 2022-04-30 LAB — SARS CORONAVIRUS 2 (TAT 6-24 HRS): SARS Coronavirus 2: POSITIVE — AB

## 2022-05-09 ENCOUNTER — Other Ambulatory Visit: Payer: No Typology Code available for payment source | Admitting: Occupational Medicine

## 2022-05-09 DIAGNOSIS — E785 Hyperlipidemia, unspecified: Secondary | ICD-10-CM

## 2022-05-09 DIAGNOSIS — R7303 Prediabetes: Secondary | ICD-10-CM

## 2022-05-09 DIAGNOSIS — N189 Chronic kidney disease, unspecified: Secondary | ICD-10-CM

## 2022-05-09 DIAGNOSIS — E559 Vitamin D deficiency, unspecified: Secondary | ICD-10-CM

## 2022-05-09 NOTE — Progress Notes (Signed)
Lab drawn from [facility] tolerated well no issues noted. Also signed up for mammogram in Jan

## 2022-05-10 LAB — LIPID PANEL
Chol/HDL Ratio: 4.7 ratio — ABNORMAL HIGH (ref 0.0–4.4)
Cholesterol, Total: 206 mg/dL — ABNORMAL HIGH (ref 100–199)
HDL: 44 mg/dL (ref >39)
LDL Chol Calc (NIH): 125 mg/dL — ABNORMAL HIGH (ref 0–99)
Triglycerides: 212 mg/dL — ABNORMAL HIGH (ref 0–149)
VLDL Cholesterol Cal: 37 mg/dL (ref 5–40)

## 2022-05-10 LAB — CBC WITH DIFFERENTIAL/PLATELET
Basophils Absolute: 0.1 10*3/uL (ref 0.0–0.2)
Basos: 1 %
EOS (ABSOLUTE): 0.2 10*3/uL (ref 0.0–0.4)
Eos: 2 %
Hematocrit: 35.2 % (ref 34.0–46.6)
Hemoglobin: 11.9 g/dL (ref 11.1–15.9)
Immature Grans (Abs): 0 10*3/uL (ref 0.0–0.1)
Immature Granulocytes: 0 %
Lymphocytes Absolute: 3.1 10*3/uL (ref 0.7–3.1)
Lymphs: 39 %
MCH: 31.1 pg (ref 26.6–33.0)
MCHC: 33.8 g/dL (ref 31.5–35.7)
MCV: 92 fL (ref 79–97)
Monocytes Absolute: 0.6 10*3/uL (ref 0.1–0.9)
Monocytes: 8 %
Neutrophils Absolute: 3.9 10*3/uL (ref 1.4–7.0)
Neutrophils: 50 %
Platelets: 290 10*3/uL (ref 150–450)
RBC: 3.83 x10E6/uL (ref 3.77–5.28)
RDW: 13.1 % (ref 11.7–15.4)
WBC: 8 10*3/uL (ref 3.4–10.8)

## 2022-05-10 LAB — COMPREHENSIVE METABOLIC PANEL
ALT: 31 IU/L (ref 0–32)
AST: 32 IU/L (ref 0–40)
Albumin/Globulin Ratio: 1.6 (ref 1.2–2.2)
Albumin: 4.5 g/dL (ref 3.8–4.8)
Alkaline Phosphatase: 58 IU/L (ref 44–121)
BUN/Creatinine Ratio: 18 (ref 12–28)
BUN: 33 mg/dL — ABNORMAL HIGH (ref 8–27)
Bilirubin Total: 0.5 mg/dL (ref 0.0–1.2)
CO2: 21 mmol/L (ref 20–29)
Calcium: 9.9 mg/dL (ref 8.7–10.3)
Chloride: 101 mmol/L (ref 96–106)
Creatinine, Ser: 1.83 mg/dL — ABNORMAL HIGH (ref 0.57–1.00)
Globulin, Total: 2.8 g/dL (ref 1.5–4.5)
Glucose: 109 mg/dL — ABNORMAL HIGH (ref 70–99)
Potassium: 4 mmol/L (ref 3.5–5.2)
Sodium: 139 mmol/L (ref 134–144)
Total Protein: 7.3 g/dL (ref 6.0–8.5)
eGFR: 29 mL/min/{1.73_m2} — ABNORMAL LOW (ref >59)

## 2022-05-10 LAB — TSH: TSH: 1.85 u[IU]/mL (ref 0.450–4.500)

## 2022-05-10 LAB — PHOSPHORUS: Phosphorus: 2.8 mg/dL — ABNORMAL LOW (ref 3.0–4.3)

## 2022-05-10 LAB — MAGNESIUM: Magnesium: 2.2 mg/dL (ref 1.6–2.3)

## 2022-05-10 LAB — VITAMIN D 25 HYDROXY (VIT D DEFICIENCY, FRACTURES): Vit D, 25-Hydroxy: 43.3 ng/mL (ref 30.0–100.0)

## 2022-05-10 LAB — PARATHYROID HORMONE, INTACT (NO CA): PTH: 16 pg/mL (ref 15–65)

## 2022-05-10 LAB — HGB A1C W/O EAG: Hgb A1c MFr Bld: 6.1 % — ABNORMAL HIGH (ref 4.8–5.6)

## 2022-05-13 NOTE — Telephone Encounter (Signed)
See results note dated 05/10/22 labs drawn

## 2022-05-18 ENCOUNTER — Other Ambulatory Visit: Payer: Self-pay | Admitting: Registered Nurse

## 2022-05-18 DIAGNOSIS — N189 Chronic kidney disease, unspecified: Secondary | ICD-10-CM

## 2022-06-22 ENCOUNTER — Other Ambulatory Visit: Payer: Self-pay | Admitting: Occupational Medicine

## 2022-06-22 DIAGNOSIS — N189 Chronic kidney disease, unspecified: Secondary | ICD-10-CM

## 2022-06-22 NOTE — Progress Notes (Signed)
Lab drawn from [facility] tolerated well no issues noted.   

## 2022-06-23 LAB — BASIC METABOLIC PANEL
BUN/Creatinine Ratio: 18 (ref 12–28)
BUN: 23 mg/dL (ref 8–27)
CO2: 23 mmol/L (ref 20–29)
Calcium: 9.5 mg/dL (ref 8.7–10.3)
Chloride: 104 mmol/L (ref 96–106)
Creatinine, Ser: 1.28 mg/dL — ABNORMAL HIGH (ref 0.57–1.00)
Glucose: 119 mg/dL — ABNORMAL HIGH (ref 70–99)
Potassium: 3.8 mmol/L (ref 3.5–5.2)
Sodium: 142 mmol/L (ref 134–144)
eGFR: 44 mL/min/{1.73_m2} — ABNORMAL LOW (ref >59)

## 2022-06-24 ENCOUNTER — Other Ambulatory Visit (HOSPITAL_BASED_OUTPATIENT_CLINIC_OR_DEPARTMENT_OTHER): Payer: Self-pay

## 2022-06-24 MED ORDER — COVID-19 MRNA 2023-2024 VACCINE (COMIRNATY) 0.3 ML INJECTION
0.3000 mL | Freq: Once | INTRAMUSCULAR | 0 refills | Status: AC
  Filled 2022-06-24: qty 0.3, 1d supply, fill #0

## 2022-07-13 ENCOUNTER — Ambulatory Visit: Payer: Self-pay | Admitting: Occupational Medicine

## 2022-07-13 ENCOUNTER — Encounter: Payer: Self-pay | Admitting: Occupational Medicine

## 2022-07-13 VITALS — BP 140/80 | Ht 70.0 in | Wt 234.0 lb

## 2022-07-13 DIAGNOSIS — Z0000 Encounter for general adult medical examination without abnormal findings: Secondary | ICD-10-CM

## 2022-07-13 NOTE — Progress Notes (Signed)
Be well insurance premium discount evaluation:   Patient completed PCM office visit epic reviewed by RN Evlyn Kanner and transcribed. Labs  Tobacco attestation signed. [facility] ROI formed signed. Forms placed in the chart.   Patient given handouts for [facility] pharmacies and discount drugs list,MyChart, Tele doc setup, Tele doc Behavioral, [place] counseling and [facility] counseling.  What to do for infectious illness protocol. Given handout for list of medications that can be filled at [facility]. Given Clinic hours and Clinic Email.  Encouraged patient to come back to re check BP even though met criteria for Be well.

## 2022-08-11 ENCOUNTER — Other Ambulatory Visit: Payer: Self-pay | Admitting: Occupational Medicine

## 2022-08-11 DIAGNOSIS — I10 Essential (primary) hypertension: Secondary | ICD-10-CM

## 2022-08-11 DIAGNOSIS — N189 Chronic kidney disease, unspecified: Secondary | ICD-10-CM

## 2022-08-11 NOTE — Progress Notes (Signed)
Lab drawn from right AC tolerated well no issues noted.

## 2022-08-12 LAB — COMPREHENSIVE METABOLIC PANEL
ALT: 17 IU/L (ref 0–32)
AST: 20 IU/L (ref 0–40)
Albumin/Globulin Ratio: 1.8 (ref 1.2–2.2)
Albumin: 4.6 g/dL (ref 3.8–4.8)
Alkaline Phosphatase: 63 IU/L (ref 44–121)
BUN/Creatinine Ratio: 16 (ref 12–28)
BUN: 22 mg/dL (ref 8–27)
Bilirubin Total: 0.3 mg/dL (ref 0.0–1.2)
CO2: 22 mmol/L (ref 20–29)
Calcium: 9.6 mg/dL (ref 8.7–10.3)
Chloride: 107 mmol/L — ABNORMAL HIGH (ref 96–106)
Creatinine, Ser: 1.36 mg/dL — ABNORMAL HIGH (ref 0.57–1.00)
Globulin, Total: 2.6 g/dL (ref 1.5–4.5)
Glucose: 113 mg/dL — ABNORMAL HIGH (ref 70–99)
Potassium: 4.1 mmol/L (ref 3.5–5.2)
Sodium: 145 mmol/L — ABNORMAL HIGH (ref 134–144)
Total Protein: 7.2 g/dL (ref 6.0–8.5)
eGFR: 41 mL/min/{1.73_m2} — ABNORMAL LOW (ref >59)

## 2022-08-12 LAB — PHOSPHORUS: Phosphorus: 3.1 mg/dL (ref 3.0–4.3)

## 2022-08-12 LAB — MAGNESIUM: Magnesium: 2.1 mg/dL (ref 1.6–2.3)

## 2022-08-26 ENCOUNTER — Telehealth: Payer: Self-pay | Admitting: Registered Nurse

## 2022-08-26 ENCOUNTER — Encounter: Payer: Self-pay | Admitting: Registered Nurse

## 2022-08-26 DIAGNOSIS — R7989 Other specified abnormal findings of blood chemistry: Secondary | ICD-10-CM

## 2022-08-26 DIAGNOSIS — R7303 Prediabetes: Secondary | ICD-10-CM

## 2022-08-26 NOTE — Telephone Encounter (Signed)
Noted worsening creatinine and GFR on PCM labs and hgba1c was worsening in December.  Message left for patient increase water/avoid nsaids and I recommend recheck kidney function and Hgba1c in 3 months.  RN Kimrey notified to schedule with patient.

## 2022-11-14 ENCOUNTER — Other Ambulatory Visit: Payer: Self-pay | Admitting: Occupational Medicine

## 2022-11-14 DIAGNOSIS — R7303 Prediabetes: Secondary | ICD-10-CM

## 2022-11-14 DIAGNOSIS — R7989 Other specified abnormal findings of blood chemistry: Secondary | ICD-10-CM

## 2022-11-14 NOTE — Progress Notes (Signed)
Lab drawn from right AC tolerated well no issues noted.  

## 2022-11-16 ENCOUNTER — Other Ambulatory Visit: Payer: Self-pay | Admitting: Registered Nurse

## 2022-11-16 ENCOUNTER — Encounter: Payer: Self-pay | Admitting: Registered Nurse

## 2022-11-16 DIAGNOSIS — R7303 Prediabetes: Secondary | ICD-10-CM

## 2022-11-16 DIAGNOSIS — R7989 Other specified abnormal findings of blood chemistry: Secondary | ICD-10-CM

## 2022-11-16 LAB — BASIC METABOLIC PANEL
BUN/Creatinine Ratio: 15 (ref 12–28)
BUN: 24 mg/dL (ref 8–27)
CO2: 24 mmol/L (ref 20–29)
Calcium: 9.6 mg/dL (ref 8.7–10.3)
Chloride: 104 mmol/L (ref 96–106)
Creatinine, Ser: 1.6 mg/dL — ABNORMAL HIGH (ref 0.57–1.00)
Glucose: 111 mg/dL — ABNORMAL HIGH (ref 70–99)
Potassium: 4.3 mmol/L (ref 3.5–5.2)
Sodium: 142 mmol/L (ref 134–144)
eGFR: 34 mL/min/{1.73_m2} — ABNORMAL LOW (ref >59)

## 2022-11-16 LAB — HEMOGLOBIN A1C
Est. average glucose Bld gHb Est-mCnc: 120 mg/dL
Hgb A1c MFr Bld: 5.8 % — ABNORMAL HIGH (ref 4.8–5.6)

## 2022-11-16 NOTE — Progress Notes (Signed)
My chart message sent to patient Asti Mackley, Your kidney function creatinine elevated and GFR low (both worsening).  Avoid NSAIDS (motrin, advil, aleve, diclofenac, voltaren, ibuprofen, naproxen, naprosyn, mobic, meloxicam)  Avoid dehydration drink water to keep urine pale yellow and clear and follow up with your PCM.  Your blood sugar 3 month average (Hgba1c) did improve from 6.1 to 5.8.  Repeat blood sugar level per your PCM in 3-12 months.  Exitcare handouts on chronic kidney disease and chronic kidney disease diet sent to my chart.  I recommend rechecking kidney function in 3 months nonfasting (BMET).  Please let us know if you have further questions. Sincerely, Albina Billet NP-C

## 2022-11-18 NOTE — Telephone Encounter (Signed)
Follow up labs completed 11/15/22 gfr and creatinine worsening stop NSAIDS avoid dehydration and follow up labs scheduled.

## 2022-11-20 NOTE — Progress Notes (Signed)
Noted patient has had follow up labs 

## 2022-12-18 NOTE — Progress Notes (Signed)
Spoke with patient in workcenter 12/15/22.  Feeling well stated has not been taking NSAID.  Denied dehydration.  Has follow up scheduled with PCM and will discuss with PCM.  Discussed continue to avoid NSAIDS/dehydration.  Denied OTC medications/herbal supplements.  Discussed creatinine elevated/worsening and GFR low worsening may need adjustment of her medications due to worsening kidney function.  Patient reported she read my chart message and will have labs rechecked in 3 months nonfasting.  Hgba1c improved.  Patient A&Ox3 spoke full sentences without difficulty skin warm dry and pink.  Respirations even and unlabored RA

## 2023-02-15 ENCOUNTER — Other Ambulatory Visit: Payer: No Typology Code available for payment source

## 2023-02-16 ENCOUNTER — Encounter: Payer: Self-pay | Admitting: Registered Nurse

## 2023-02-16 ENCOUNTER — Other Ambulatory Visit: Payer: Self-pay | Admitting: Registered Nurse

## 2023-02-16 DIAGNOSIS — R7989 Other specified abnormal findings of blood chemistry: Secondary | ICD-10-CM

## 2023-02-16 NOTE — Progress Notes (Signed)
72y/o caucasian female established patient here for BMET-7 lab draw ordered by [facility].  Right AC venipuncture x 1 gauze and cobain applied afterwards clean dry and intact on ambulatory discharge A&Ox3 spoke full sentences without difficulty respirations even and unlabored RA feeling well gait sure and steady.  Patient stated has been drinking water/avoiding dehydration and NSAIDS as elevated serum creatinine with NSAID use worsening.  Discussed [facility] notified to send copy to her PCM and I will send her my chart message with results also tomorrow after results received from [facility].  Patient agreed with plan of care and had no further questions at that time.

## 2023-02-17 ENCOUNTER — Telehealth: Payer: Self-pay | Admitting: Registered Nurse

## 2023-02-17 DIAGNOSIS — N189 Chronic kidney disease, unspecified: Secondary | ICD-10-CM

## 2023-02-17 LAB — BASIC METABOLIC PANEL
BUN/Creatinine Ratio: 17 (ref 12–28)
BUN: 23 mg/dL (ref 8–27)
CO2: 24 mmol/L (ref 20–29)
Calcium: 10 mg/dL (ref 8.7–10.3)
Chloride: 105 mmol/L (ref 96–106)
Creatinine, Ser: 1.34 mg/dL — ABNORMAL HIGH (ref 0.57–1.00)
Glucose: 102 mg/dL — ABNORMAL HIGH (ref 70–99)
Potassium: 4 mmol/L (ref 3.5–5.2)
Sodium: 142 mmol/L (ref 134–144)
eGFR: 42 mL/min/{1.73_m2} — ABNORMAL LOW (ref >59)

## 2023-02-17 NOTE — Telephone Encounter (Signed)
Latest Reference Range & Units 07/23/21 10:37 12/27/21 08:50 05/09/22 08:30 06/22/22 08:30 08/11/22 08:50 11/15/22 08:35  COMPREHENSIVE METABOLIC PANEL    Rpt !  Rpt !   Sodium 134 - 144 mmol/L 140 146 (H) 139 142 145 (H) 142  Potassium 3.5 - 5.2 mmol/L 4.4 4.4 4.0 3.8 4.1 4.3  Chloride 96 - 106 mmol/L 103 107 (H) 101 104 107 (H) 104  CO2 20 - 29 mmol/L  20 21 23 22 24   Glucose 70 - 99 mg/dL 960 (H) 454 (H) 098 (H) 119 (H) 113 (H) 111 (H)  BUN 8 - 27 mg/dL 20 26 33 (H) 23 22 24   Creatinine 0.57 - 1.00 mg/dL 1.19 (H) 1.47 8.29 (H) 1.28 (H) 1.36 (H) 1.60 (H)  Calcium 8.7 - 10.3 mg/dL 56.2 13.0 9.9 9.5 9.6 9.6  BUN/Creatinine Ratio 12 - 28  15 16 18 18 16 15   eGFR >59 mL/min/1.73 43 (L) 32 (L) 29 (L) 44 (L) 41 (L) 34 (L)  Phosphorus 3.0 - 4.3 mg/dL 3.5  2.8 (L)  3.1   Magnesium 1.6 - 2.3 mg/dL 2.1  2.2  2.1   Alkaline Phosphatase 44 - 121 IU/L 68  58  63   Albumin 3.8 - 4.8 g/dL 4.5  4.5  4.6   Albumin/Globulin Ratio 1.2 - 2.2  1.6  1.6  1.8   Uric Acid 3.1 - 7.9 mg/dL 5.1       AST 0 - 40 IU/L 32  32  20   ALT 0 - 32 IU/L 28  31  17    Total Protein 6.0 - 8.5 g/dL 7.3  7.3  7.2   Total Bilirubin 0.0 - 1.2 mg/dL 0.5  0.5  0.3   GGT 0 - 60 IU/L 10       !: Data is abnormal (H): Data is abnormally high (L): Data is abnormally low Rpt: View report in Results Review for more information  My chart message sent to patient  exitcare handouts on chronic kidney disease and food basics for chronic kidney disease also sent to patient  [place],  Below are you results from the [facility] portal they have not crossed over into Epic yet.  Creatinine improving back to 2023 level but still elevated.    eGFR another measure of kidney function has improved also.  Normal electrolytes and blood sugar for nonfasting sample. Continue avoiding NSAIDS e.g. motrin/advil/aleve/naproxen/naprosyn/diclofenac/voltaren/mobic/meloxicam and dehydration.  Talk with pharmacist or provider before starting new herbs/dietary  supplements or over the counter medication.  Please let me know if you have further questions or concerns.  Sincerely,  Albina Billet NP-C  Glucose  102 High mg/dL 70 - 99  BUN  23 mg/dL 8 - 27  Creatinine  8.65 High mg/dL 7.84 - 6.96  eGFR  42 Low mL/min/1.73   BUN/Creatinine Ratio  17 12 - 28  Sodium  142 mmol/L 134 - 144  Potassium  4.0 mmol/L 3.5 - 5.2  Chloride  105 mmol/L 96 - 106  Carbon Dioxide, Total  24 mmol/L 20 - 29  Calcium  10.0 mg/dL 8.7 - 10.3

## 2023-03-15 NOTE — Telephone Encounter (Signed)
Patient came into clinic 02/23/23 stated saw results in my chart continuing to drink water and avoid nsaids and has follow up with PCM scheduled/planned.  Denied further concerns or questions at this time.

## 2023-03-30 ENCOUNTER — Ambulatory Visit: Payer: Self-pay

## 2023-03-30 DIAGNOSIS — Z23 Encounter for immunization: Secondary | ICD-10-CM

## 2023-05-02 ENCOUNTER — Ambulatory Visit (INDEPENDENT_AMBULATORY_CARE_PROVIDER_SITE_OTHER): Payer: No Typology Code available for payment source | Admitting: Otolaryngology

## 2023-05-02 ENCOUNTER — Encounter (INDEPENDENT_AMBULATORY_CARE_PROVIDER_SITE_OTHER): Payer: Self-pay

## 2023-05-02 ENCOUNTER — Ambulatory Visit (INDEPENDENT_AMBULATORY_CARE_PROVIDER_SITE_OTHER): Payer: No Typology Code available for payment source | Admitting: Audiology

## 2023-05-02 VITALS — Ht 70.0 in | Wt 235.0 lb

## 2023-05-02 DIAGNOSIS — H903 Sensorineural hearing loss, bilateral: Secondary | ICD-10-CM

## 2023-05-02 DIAGNOSIS — H9311 Tinnitus, right ear: Secondary | ICD-10-CM

## 2023-05-02 NOTE — Progress Notes (Signed)
  [address] [place], [place] [address] [phone]  Audiological Evaluation    Name: Catherine Atkins     DOB:   17-Aug-1950      MRN:   [id number]                                                                                     Service Date: 05/02/2023        Patient was referred today for a hearing evaluation by Dr. Karle Barr.   Symptoms Yes Details  Hearing loss  [x]  Patient reported difficulty understanding others when they whisper.  Tinnitus  [x]  Patient reported experiencing right ear tinnitus.  Balance problems  [x]  Patient reported intermittent imbalance sensations.  Previous ear surgeries  []  Patient denied any previous ear surgeries.  Amplification  []  Patient denied the use of hearing aids.    Tympanogram: Right ear: Normal external ear canal volume with normal middle ear pressure and tympanic membrane compliance (Type A). Left ear: Normal external ear canal volume with normal middle ear pressure and tympanic membrane compliance (Type A).    Hearing Evaluation: The audiogram was completed using conventional audiometric techniques under headphones with good reliability.   The hearing test results indicate: Right ear: Normal hearing sensitivity from [phone] Hz sloping to moderate sensorineural hearing loss from 2000-8000 Hz. Left ear: Normal hearing sensitivity from [phone] Hz sloping to mild sensorineural hearing loss from 4000-8000 Hz.  Speech Audiometry: Right ear- Speech Reception Threshold (SRT) was obtained at 35 dBHL masked. Left ear- Speech Reception Threshold (SRT) was obtained at 30 dBHL.   Word Recognition Score Tested using NU-6 (MLV) Right ear: 88% was obtained at a presentation level of 75 dBHL with contralateral masking which is deemed as good understanding. Left ear: 96% was obtained at a presentation level of 70 dBHL which is deemed as excellent understanding.   Recommendations: Repeat audiogram when changes are perceived  or per MD. Consider various tinnitus strategies, including the use of a noise generator, hearing aids, or tinnitus retraining therapy.   Conley Rolls Dwyane Dupree, AUD, CCC-A 05/02/23

## 2023-05-03 DIAGNOSIS — H903 Sensorineural hearing loss, bilateral: Secondary | ICD-10-CM | POA: Insufficient documentation

## 2023-05-03 DIAGNOSIS — H9311 Tinnitus, right ear: Secondary | ICD-10-CM | POA: Insufficient documentation

## 2023-05-03 NOTE — Progress Notes (Signed)
Patient ID: Catherine Atkins, female   DOB: 16-Dec-1950, 72 y.o.   MRN: [id number]  CC: Hearing loss, right ear tinnitus  HPI:  Catherine Atkins is a 72 y.o. female who presents today complaining of bilateral progressive hearing loss and right ear tinnitus.  According to the patient, she has been symptomatic for more than 10 years.  She describes her right ear tinnitus as a constant ringing noise.  It is nonpulsatile.  The severity of her tinnitus has increased this year.  Currently the patient denies any otalgia, otorrhea, or vertigo.  She has no recent otitis media or otitis externa.  She has no previous otologic surgery.  Past Medical History:  Diagnosis Date   Breast cancer (HCC) 01/25/2012   left mastectomy   Dental crowns present    Depression    History of breast cancer 2013   Hypertension    states is under control with meds., has been on med. 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 Jena Gauss: diverticulosis, diminutive rectosigmoid polyps removed/cold biopsy.   COLONOSCOPY WITH PROPOFOL N/A 07/09/2018   Procedure: COLONOSCOPY WITH PROPOFOL;  Surgeon: Corbin Ade, MD;  [facility]: [facility];  Service: Endoscopy;  Laterality: N/A;  2:00pm   DEBRIDEMENT AND CLOSURE WOUND Left 11/06/2015   Procedure: EXCISION OF LEFT CHEST CHRONIC SEROMA ;  Surgeon: Glenna Fellows, MD;  Location: [facility];  Service: Plastics;  Laterality: Left;   MASTECTOMY Left 01/25/2012   no radiation no chemo   POLYPECTOMY  07/09/2018   Procedure: POLYPECTOMY;  Surgeon: Corbin Ade, MD;  Location: [facility];  Service: Endoscopy;;  cecal polyps x3     Family History  Problem Relation Age of Onset   Cancer Mother        breast   Breast cancer Mother 46   Leukemia Father     Cancer Sister        breast   Breast cancer Sister 49   Prostate cancer Maternal Uncle    Heart attack Brother    Colon cancer Neg Hx     Social History:  reports that she has never smoked. She has never used smokeless tobacco. She reports that she does not drink alcohol and does not use drugs.  Allergies:  Allergies  Allergen Reactions   Codeine Itching    Prior to Admission medications   Medication Sig Start Date End Date Taking? Authorizing Provider  acetaminophen (TYLENOL) 500 MG tablet Take 1,000 mg by mouth every 6 (six) hours as needed for moderate pain.   Yes [provider]  amLODipine-benazepril (LOTREL) 10-40 MG capsule Take 1 capsule by mouth daily. 06/18/21  Yes [provider]  atenolol (TENORMIN) 25 MG tablet Take 50 mg by mouth daily.    Yes [provider]  buPROPion (WELLBUTRIN XL) 150 MG 24 hr tablet Take 450 mg by mouth daily.    Yes [provider]  escitalopram (LEXAPRO) 5 MG tablet Take 5 mg by mouth daily.  03/01/18  Yes [provider]  hydrochlorothiazide (HYDRODIURIL) 25 MG tablet Take 25 mg by mouth daily. 06/21/21  Yes [provider]  Multiple Vitamin (MULTIVITAMIN WITH MINERALS) TABS tablet Take 1 tablet  by mouth daily.    [provider]    Height 5\' 10"  (1.778 m), weight 235 lb (106.6 kg). Exam: General: Communicates without difficulty, well nourished, no acute distress. Head: Normocephalic, no evidence injury, no tenderness, facial buttresses intact without stepoff. Face/sinus: No tenderness to palpation and percussion. Facial movement is normal and symmetric. Eyes: PERRL, EOMI. No scleral icterus, conjunctivae clear. Neuro: CN II exam reveals vision grossly intact.  No nystagmus at any point of gaze. Ears: Auricles well formed without lesions.  Ear canals are intact without mass or lesion.  No erythema or edema is appreciated.  The TMs are intact without fluid. Nose: External evaluation reveals  normal support and skin without lesions.  Dorsum is intact.  Anterior rhinoscopy reveals congested mucosa over anterior aspect of inferior turbinates and intact septum.  No purulence noted. Oral:  Oral cavity and oropharynx are intact, symmetric, without erythema or edema.  Mucosa is moist without lesions. Neck: Full range of motion without pain.  There is no significant lymphadenopathy.  No masses palpable.  Thyroid bed within normal limits to palpation.  Parotid glands and submandibular glands equal bilaterally without mass.  Trachea is midline. Neuro:  CN 2-12 grossly intact.    The hearing test shows bilateral high-frequency sensorineural hearing loss, slightly worse on the right side.  Assessment: 1.  Bilateral high-frequency sensorineural hearing loss, slightly worse on the right side. 2.  The right ear tinnitus is likely a direct result of the hearing loss. 3.  Her ear canals, tympanic membranes, and middle ear spaces are all normal.  Plan: 1.  The physical exam findings and the hearing test results are reviewed with the patient. 2.  The strategies to cope with tinnitus, including the use of masker, hearing aids, tinnitus retraining therapy, and avoidance of caffeine and alcohol are discussed. 3.  The small possibility of a retrocochlear lesion causing the asymmetric hearing loss is discussed.  The options of conservative observation versus MRI scan are reviewed.  The patient would like to proceed with conservative observation.  4.  The patient will return for reevaluation in 6 months, sooner if needed.  Verdella Laidlaw W Clanton Emanuelson 05/03/2023, 8:22 AM

## 2023-05-11 ENCOUNTER — Other Ambulatory Visit: Payer: Self-pay | Admitting: Registered Nurse

## 2023-05-11 ENCOUNTER — Other Ambulatory Visit: Payer: Self-pay

## 2023-05-11 NOTE — Progress Notes (Unsigned)
Lipid, cbc with diff., phosphorus, magnesium, and vit D labs drawn 05/11/23 0900am tolerated well. BP 120/80  Established Patient Office Visit  Subjective   Patient ID: Catherine Atkins, female    DOB: 1950/08/21  Age: 72 y.o. MRN: [id number]  No chief complaint on file.   HPI  {History (Optional):23778}  ROS    Objective:     There were no vitals taken for this visit. {Vitals History (Optional):23777}  Physical Exam   No results found for any visits on 05/11/23.  {Labs (Optional):23779}  The 10-year ASCVD risk score (Arnett DK, et al., 2019) is: 22.5%    Assessment & Plan:   Problem List Items Addressed This Visit   None   No follow-ups on file.    Therese Sarah, RN

## 2023-05-12 LAB — COMPREHENSIVE METABOLIC PANEL
ALT: 19 [IU]/L (ref 0–32)
AST: 20 [IU]/L (ref 0–40)
Albumin: 4.6 g/dL (ref 3.8–4.8)
Alkaline Phosphatase: 63 [IU]/L (ref 44–121)
BUN/Creatinine Ratio: 18 (ref 12–28)
BUN: 25 mg/dL (ref 8–27)
Bilirubin Total: 0.4 mg/dL (ref 0.0–1.2)
CO2: 22 mmol/L (ref 20–29)
Calcium: 9.8 mg/dL (ref 8.7–10.3)
Chloride: 105 mmol/L (ref 96–106)
Creatinine, Ser: 1.39 mg/dL — ABNORMAL HIGH (ref 0.57–1.00)
Globulin, Total: 2.8 g/dL (ref 1.5–4.5)
Glucose: 109 mg/dL — ABNORMAL HIGH (ref 70–99)
Potassium: 3.8 mmol/L (ref 3.5–5.2)
Sodium: 143 mmol/L (ref 134–144)
Total Protein: 7.4 g/dL (ref 6.0–8.5)
eGFR: 40 mL/min/{1.73_m2} — ABNORMAL LOW (ref >59)

## 2023-05-12 LAB — LIPID PANEL W/O CHOL/HDL RATIO
Cholesterol, Total: 229 mg/dL — ABNORMAL HIGH (ref 100–199)
HDL: 52 mg/dL (ref >39)
LDL Chol Calc (NIH): 144 mg/dL — ABNORMAL HIGH (ref 0–99)
Triglycerides: 183 mg/dL — ABNORMAL HIGH (ref 0–149)
VLDL Cholesterol Cal: 33 mg/dL (ref 5–40)

## 2023-05-12 LAB — PHOSPHORUS: Phosphorus: 3.3 mg/dL (ref 3.0–4.3)

## 2023-05-12 LAB — MAGNESIUM: Magnesium: 2.2 mg/dL (ref 1.6–2.3)

## 2023-05-12 LAB — VITAMIN D 25 HYDROXY (VIT D DEFICIENCY, FRACTURES): Vit D, 25-Hydroxy: 28.8 ng/mL — ABNORMAL LOW (ref 30.0–100.0)

## 2023-06-15 ENCOUNTER — Encounter: Payer: Self-pay | Admitting: *Deleted

## 2023-07-21 ENCOUNTER — Other Ambulatory Visit: Payer: Self-pay | Admitting: Internal Medicine

## 2023-07-21 DIAGNOSIS — Z1231 Encounter for screening mammogram for malignant neoplasm of breast: Secondary | ICD-10-CM

## 2023-08-11 ENCOUNTER — Other Ambulatory Visit: Payer: Self-pay | Admitting: Internal Medicine

## 2023-08-11 ENCOUNTER — Ambulatory Visit
Admission: RE | Admit: 2023-08-11 | Discharge: 2023-08-11 | Disposition: A | Discharge status: Home / Self Care | Payer: No Typology Code available for payment source | Source: Ambulatory Visit | Attending: Internal Medicine | Admitting: Internal Medicine

## 2023-08-11 DIAGNOSIS — Z1231 Encounter for screening mammogram for malignant neoplasm of breast: Secondary | ICD-10-CM

## 2023-08-12 ENCOUNTER — Encounter: Payer: Self-pay | Admitting: Registered Nurse

## 2023-08-12 ENCOUNTER — Telehealth: Payer: Self-pay | Admitting: Registered Nurse

## 2023-08-12 DIAGNOSIS — Z0000 Encounter for general adult medical examination without abnormal findings: Secondary | ICD-10-CM

## 2023-08-12 NOTE — Telephone Encounter (Signed)
 Received message from Epic BMET order expired.  Epic reviewed was completed 05/11/2023 PCM office  LDL 144 did not meet Be Well 2026 requirements.  No Hgba1c since Jun 2024 5.8 ordered for completion along with BP/Ht/Wt since none on file since 07 Mar 2023 in Epic.  Message left for patient to schedule nonfasting lab hgba1c/BP/Ht/Wt with RN Olegario Messier and to notify me if not planning to have [employer] insurance starting 06 Mar 2024.

## 2023-08-17 ENCOUNTER — Other Ambulatory Visit: Payer: Self-pay | Admitting: Internal Medicine

## 2023-08-17 ENCOUNTER — Other Ambulatory Visit

## 2023-08-17 DIAGNOSIS — Z0000 Encounter for general adult medical examination without abnormal findings: Secondary | ICD-10-CM

## 2023-08-17 DIAGNOSIS — R928 Other abnormal and inconclusive findings on diagnostic imaging of breast: Secondary | ICD-10-CM

## 2023-08-18 LAB — HGB A1C W/O EAG: Hgb A1c MFr Bld: 5.7 % — ABNORMAL HIGH (ref 4.8–5.6)

## 2023-08-19 ENCOUNTER — Encounter: Payer: Self-pay | Admitting: Registered Nurse

## 2023-08-28 ENCOUNTER — Other Ambulatory Visit: Payer: Self-pay | Admitting: Internal Medicine

## 2023-08-28 ENCOUNTER — Ambulatory Visit
Admission: RE | Admit: 2023-08-28 | Discharge: 2023-08-28 | Disposition: A | Discharge status: Home / Self Care | Source: Ambulatory Visit | Attending: Internal Medicine | Admitting: Internal Medicine

## 2023-08-28 DIAGNOSIS — R928 Other abnormal and inconclusive findings on diagnostic imaging of breast: Secondary | ICD-10-CM

## 2023-08-28 DIAGNOSIS — N6310 Unspecified lump in the right breast, unspecified quadrant: Secondary | ICD-10-CM

## 2023-08-29 NOTE — Telephone Encounter (Signed)
 BP with RN Olegario Messier 141/90 weight 236lbs BMI 34.4.  Message left for patient needs to have either BP rechecks with RN Olegario Messier or PCM appt to meet requirements to Be Well 2026 insurance discount.  Patient needs to come to clinic to sign alternative form.  NP signed Be Well paperwork and alternative paperwork for patient today.  [facility] form held until alternatives completed.

## 2023-08-29 NOTE — Telephone Encounter (Signed)
 Patient signed alternative form and stated she had BP at office visit with [facility] 08/23/23 that PCM was very happy with.  PCM office does not have a patient portal.  Asked RN Olegario Messier to contact [facility] office to verify BP at office visit.  If unable to verify BP via telephone patient will see RN Olegario Messier for repeat BP check.  Patient will sign medical release form if needed by [facility] office.

## 2023-08-31 ENCOUNTER — Ambulatory Visit
Admission: RE | Admit: 2023-08-31 | Discharge: 2023-08-31 | Disposition: A | Discharge status: Home / Self Care | Source: Ambulatory Visit | Attending: Internal Medicine | Admitting: Internal Medicine

## 2023-08-31 DIAGNOSIS — N6310 Unspecified lump in the right breast, unspecified quadrant: Secondary | ICD-10-CM

## 2023-08-31 HISTORY — PX: BREAST BIOPSY: SHX20

## 2023-09-01 ENCOUNTER — Telehealth: Payer: Self-pay | Admitting: *Deleted

## 2023-09-01 LAB — SURGICAL PATHOLOGY

## 2023-09-01 NOTE — Telephone Encounter (Signed)
 Spoke to patient to confirm upcoming afternoon [facility] clinic appointment on 4/2, paperwork will be sent via mail and email  Gave location and time, also informed patient that the surgeon's office would be calling as well to get information from them similar to the packet that they will be receiving so make sure to do both.  Reminded patient that all providers will be coming to the clinic to see them HERE and if they had any questions to not hesitate to reach back out to myself or their navigators.

## 2023-09-01 NOTE — Telephone Encounter (Signed)
 RN Olegario Messier verified with PCM office BP was less than 135/85 at office visit.  Be Well paperwork completed by NP and [facility] form given to HR Tonya that patient met requirements for insurance discount starting 06 Mar 2024.

## 2023-09-01 NOTE — Telephone Encounter (Signed)
 LVM to patient in reference to upcoming appointment, left my contact to call back, emailed and mailed packet for [facility] on 4/2 arrival of 1215

## 2023-09-04 ENCOUNTER — Encounter: Payer: Self-pay | Admitting: *Deleted

## 2023-09-05 ENCOUNTER — Encounter: Payer: Self-pay | Admitting: Hematology and Oncology

## 2023-09-05 ENCOUNTER — Other Ambulatory Visit: Payer: Self-pay | Admitting: *Deleted

## 2023-09-05 DIAGNOSIS — C50211 Malignant neoplasm of upper-inner quadrant of right female breast: Secondary | ICD-10-CM | POA: Insufficient documentation

## 2023-09-06 ENCOUNTER — Inpatient Hospital Stay (HOSPITAL_BASED_OUTPATIENT_CLINIC_OR_DEPARTMENT_OTHER): Admitting: Genetic Counselor

## 2023-09-06 ENCOUNTER — Encounter: Payer: Self-pay | Admitting: General Practice

## 2023-09-06 ENCOUNTER — Encounter: Payer: Self-pay | Admitting: Hematology and Oncology

## 2023-09-06 ENCOUNTER — Ambulatory Visit: Payer: Self-pay | Admitting: Physical Therapy

## 2023-09-06 ENCOUNTER — Ambulatory Visit
Admission: RE | Admit: 2023-09-06 | Discharge: 2023-09-06 | Disposition: A | Discharge status: Home / Self Care | Source: Ambulatory Visit | Attending: Radiation Oncology | Admitting: Radiation Oncology

## 2023-09-06 ENCOUNTER — Inpatient Hospital Stay

## 2023-09-06 ENCOUNTER — Ambulatory Visit: Payer: Self-pay | Admitting: Surgery

## 2023-09-06 ENCOUNTER — Inpatient Hospital Stay: Attending: Hematology and Oncology | Admitting: Hematology and Oncology

## 2023-09-06 VITALS — BP 134/74 | HR 77 | Temp 98.3°F | Resp 18 | Ht 69.5 in | Wt 235.5 lb

## 2023-09-06 DIAGNOSIS — C50912 Malignant neoplasm of unspecified site of left female breast: Secondary | ICD-10-CM

## 2023-09-06 DIAGNOSIS — Z170 Estrogen receptor positive status [ER+]: Secondary | ICD-10-CM

## 2023-09-06 DIAGNOSIS — C50211 Malignant neoplasm of upper-inner quadrant of right female breast: Secondary | ICD-10-CM | POA: Diagnosis present

## 2023-09-06 DIAGNOSIS — Z806 Family history of leukemia: Secondary | ICD-10-CM

## 2023-09-06 DIAGNOSIS — Z803 Family history of malignant neoplasm of breast: Secondary | ICD-10-CM | POA: Diagnosis not present

## 2023-09-06 DIAGNOSIS — Z853 Personal history of malignant neoplasm of breast: Secondary | ICD-10-CM

## 2023-09-06 DIAGNOSIS — Z9012 Acquired absence of left breast and nipple: Secondary | ICD-10-CM | POA: Insufficient documentation

## 2023-09-06 DIAGNOSIS — Z8042 Family history of malignant neoplasm of prostate: Secondary | ICD-10-CM

## 2023-09-06 LAB — CBC WITH DIFFERENTIAL (CANCER CENTER ONLY)
Abs Immature Granulocytes: 0.02 10*3/uL (ref 0.00–0.07)
Basophils Absolute: 0.1 10*3/uL (ref 0.0–0.1)
Basophils Relative: 1 %
Eosinophils Absolute: 0.1 10*3/uL (ref 0.0–0.5)
Eosinophils Relative: 2 %
HCT: 35.3 % — ABNORMAL LOW (ref 36.0–46.0)
Hemoglobin: 12 g/dL (ref 12.0–15.0)
Immature Granulocytes: 0 %
Lymphocytes Relative: 39 %
Lymphs Abs: 2.9 10*3/uL (ref 0.7–4.0)
MCH: 32.5 pg (ref 26.0–34.0)
MCHC: 34 g/dL (ref 30.0–36.0)
MCV: 95.7 fL (ref 80.0–100.0)
Monocytes Absolute: 0.7 10*3/uL (ref 0.1–1.0)
Monocytes Relative: 9 %
Neutro Abs: 3.6 10*3/uL (ref 1.7–7.7)
Neutrophils Relative %: 49 %
Platelet Count: 277 10*3/uL (ref 150–400)
RBC: 3.69 MIL/uL — ABNORMAL LOW (ref 3.87–5.11)
RDW: 13.2 % (ref 11.5–15.5)
WBC Count: 7.4 10*3/uL (ref 4.0–10.5)
nRBC: 0 % (ref 0.0–0.2)

## 2023-09-06 LAB — CMP (CANCER CENTER ONLY)
ALT: 17 U/L (ref 0–44)
AST: 20 U/L (ref 15–41)
Albumin: 4.5 g/dL (ref 3.5–5.0)
Alkaline Phosphatase: 55 U/L (ref 38–126)
Anion gap: 7 (ref 5–15)
BUN: 18 mg/dL (ref 8–23)
CO2: 27 mmol/L (ref 22–32)
Calcium: 9.7 mg/dL (ref 8.9–10.3)
Chloride: 108 mmol/L (ref 98–111)
Creatinine: 1.32 mg/dL — ABNORMAL HIGH (ref 0.44–1.00)
GFR, Estimated: 42 mL/min — ABNORMAL LOW (ref >60)
Glucose, Bld: 100 mg/dL — ABNORMAL HIGH (ref 70–99)
Potassium: 3.8 mmol/L (ref 3.5–5.1)
Sodium: 142 mmol/L (ref 135–145)
Total Bilirubin: 0.4 mg/dL (ref 0.0–1.2)
Total Protein: 7.7 g/dL (ref 6.5–8.1)

## 2023-09-06 LAB — GENETIC SCREENING ORDER

## 2023-09-06 NOTE — Progress Notes (Unsigned)
 REFERRING PROVIDER: Serena Croissant, MD [address] [place],  [place] [address]  PRIMARY PROVIDER:  Carylon Perches, MD  PRIMARY REASON FOR VISIT:  Encounter Diagnoses  Name Primary?   Malignant neoplasm of upper-inner quadrant of right breast in female, estrogen receptor positive (HCC) Yes   History of breast cancer    Family history of breast cancer    Family history of prostate cancer       HISTORY OF PRESENT ILLNESS:   Catherine Atkins, a 73 y.o. female, was seen for a [place] cancer genetics consultation at the request of Dr. Pamelia Hoit  due to a personal history of breast cancer.  Ms. Ayub presents to clinic today to discuss the possibility of a hereditary predisposition to cancer, to discuss genetic testing, and to further clarify her future cancer risks, as well as potential cancer risks for family members.   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 The treatment plan included left mastectomy and anti-estrogens.  In 2013, she had genetic testing through [facility] for BRCA1/2 only (sequencing and rearrangement analysis), which was negative.   In 2025, at the age of 55, Catherine Atkins was diagnosed with invasive ductal carcinoma of the right breast (ER+/PR+/HER2-).  The treatment plan is pending.     CANCER HISTORY:  Oncology History  Cancer of left breast (HCC) (Resolved)  01/25/2012 Definitive Surgery   Left mastectomy: invasive ductal carcinoma, grade 2, ER+ (100%), PR+ (80%), HER2/neu negative, Ki67 10%   01/25/2012 Pathologic Stage   Stage IA: mpT1c N0   01/25/2012 Oncotype testing   RS 27 (18% ROR)   09/2012 -  Anti-estrogen oral therapy   Tamoxifen 20 mg daily   Malignant neoplasm of lower-outer quadrant of left breast of female, estrogen receptor positive (HCC)  2013 Initial Diagnosis   Left mastectomy (did not receive chemo or radiation or antiestrogens) Oncotype recurrence score: 27  (distant recurrence rate: 18%)   Malignant neoplasm of upper-inner quadrant of right breast in female, estrogen receptor positive (HCC)  08/31/2023 Initial Diagnosis   Mammogram detected irregular mass right breast 0.9 cm, axilla negative, biopsy: Grade 3 IDC ER 100%, PR 90%, Ki67 25%, HER2 negative by [facility]        Past Medical History:  Diagnosis Date   Breast cancer (HCC) 01/25/2012   left mastectomy   Dental crowns present    Depression    History of breast cancer 2013   Hypertension    states is under control with meds., has been on med. 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 Jena Gauss: diverticulosis, diminutive rectosigmoid polyps removed/cold biopsy.   COLONOSCOPY WITH PROPOFOL N/A 07/09/2018   Procedure: COLONOSCOPY WITH PROPOFOL;  Surgeon: Corbin Ade, MD;  [facility];  Service: Endoscopy;  Laterality: N/A;  2:00pm   DEBRIDEMENT AND CLOSURE WOUND Left 11/06/2015   Procedure: EXCISION OF LEFT CHEST CHRONIC SEROMA ;  Surgeon: Glenna Fellows, MD;  Location: [facility];  Service: Plastics;  Laterality: Left;  MASTECTOMY Left 01/25/2012   no radiation no chemo   POLYPECTOMY  07/09/2018   Procedure: POLYPECTOMY;  Surgeon: Corbin Ade, MD;  Location: [facility];  Service: Endoscopy;;  cecal polyps x3     FAMILY HISTORY:  We obtained a detailed, 4-generation family history.  Significant diagnoses are listed below: Family History  Problem Relation Age of Onset   Breast cancer Mother        dx late 59s   Leukemia Father        dx 37s   Breast cancer Sister        dx 67s   Heart attack Brother    Prostate  cancer Brother    Prostate cancer Brother    Prostate cancer Maternal Uncle        x2 maternal uncles     Catherine Atkins is unaware of previous family history of genetic testing for hereditary cancer risks. Other relatives are unavailable for genetic testing at this time.  There is no reported Ashkenazi Jewish ancestry. There is no known consanguinity.  GENETIC COUNSELING ASSESSMENT: Catherine Atkins is a 73 y.o. female with a history of two primary breast cancer and a family history of breast and related-cancers which is somewhat suggestive of a hereditary cancer syndrome and predisposition to cancer.  We, therefore, discussed and recommended the following at today's visit.   DISCUSSION: We discussed that 5 - 10% of cancer is hereditary.  Most cases of hereditary breast cancer are associated with mutations in BRCA1/2.  There are other genes that can be associated with hereditary breast cancer syndromes.  These include but are not limited to PALB2, CHEK2, and ATM.  We discussed that her genetic testing in 2013 is not considered comprehensive for breast cancer genes by today's standards given she was tested only for BRCA1/2. We discussed that updated testing is beneficial for several reasons including knowing how to follow individuals for their cancer risks and understanding if other family members could be at an increased risk for cancer and allowing them to undergo genetic testing.   We reviewed the characteristics, features and inheritance patterns of hereditary cancer syndromes. We also discussed genetic testing, including the appropriate family members to test, the process of testing, insurance coverage and turn-around-time for results. We discussed the implications of a negative, positive, carrier and/or variant of uncertain significant result. We recommended Catherine Atkins pursue genetic testing for a panel that includes genes associated with breast, prostate, and other cancers.   Catherine Atkins  was offered a  common hereditary cancer panel (~40 genes) and an expanded pan-cancer panel (~70 genes). Catherine Atkins was informed of the benefits and limitations of each panel, including that expanded pan-cancer panels contain genes that do not have clear management guidelines at this point in time.  We also discussed that as the number of genes included on a panel increases, the chances of variants of uncertain significance increases.  After considering the benefits and limitations of each gene panel, Catherine Atkins  elected to have an expanded pan-cancer panel through [place].  The CancerNext-Expanded gene panel offered by [facility] and includes sequencing, rearrangement, and RNA analysis for the following 76 genes: AIP, ALK, APC, ATM, AXIN2, BAP1, BARD1, BMPR1A, BRCA1, BRCA2, BRIP1, CDC73, CDH1, CDK4, CDKN1B, CDKN2A, CEBPA, CHEK2, CTNNA1, DDX41, DICER1, ETV6, FH, FLCN, GATA2, LZTR1, MAX, MBD4, MEN1, MET, MLH1, MSH2, MSH3, MSH6, MUTYH, NF1, NF2, NTHL1, PALB2, PHOX2B, PMS2, POT1, PRKAR1A, PTCH1, PTEN, RAD51C, RAD51D, RB1, RET, RUNX1, SDHA, SDHAF2, SDHB, SDHC, SDHD,  SMAD4, SMARCA4, SMARCB1, SMARCE1, STK11, SUFU, TMEM127, TP53, TSC1, TSC2, VHL, and WT1 (sequencing and deletion/duplication); EGFR, HOXB13, KIT, MITF, PDGFRA, POLD1, and POLE (sequencing only); EPCAM and GREM1 (deletion/duplication only).   Based on Catherine Atkins's personal history of breast cancer and family history of breast cancer in her sister and mother, she meets medical criteria for genetic testing.  She is the most informative relative available for genetic testing. Her testing in 2013 included BRCA1/2 only; thus updated genetic testing is medically necessary.  We discussed that if her out of pocket cost for testing is over $100, the laboratory should contact her and discuss the self-pay prices and/or patient pay assistance programs.    PLAN: After considering the risks, benefits, and limitations, Catherine Atkins provided informed consent to pursue genetic testing  and the blood sample was sent to [facility] for analysis of the CancerNext-Expanded +RNA Panel. Results should be available within approximately 3 weeks, at which point they will be disclosed by telephone to Catherine Atkins, as will any additional recommendations warranted by these results. Catherine Atkins will receive a summary of her genetic counseling visit and a copy of her results once available. This information will also be available in Epic.   Catherine Atkins questions were answered to her satisfaction today. Our contact information was provided should additional questions or concerns arise. Thank you for the referral and allowing [place] to share in the care of your patient.   Catherine Atkins M. Rennie Plowman, MS, [facility] Genetic Counselor [email or web address] (P) [phone]   35 minutes were spent on the date of the encounter in service to the patient including preparation, face-to-face consultation, documentation and care coordination.  The patient was seen alone.  Dr. Pamelia Hoit was available to discuss this case as needed.    _______________________________________________________________________ For Office Staff:  Number of people involved in session: 1 Was an Intern/ student involved with case: no

## 2023-09-06 NOTE — Assessment & Plan Note (Signed)
 08/31/2023:Mammogram detected irregular mass right breast 0.9 cm, axilla negative, biopsy: Grade 3 IDC ER 100%, PR 90%, Ki67 25%, HER2 negative by [facility]  2013: Left mastectomy: Oncotype score 27 (distant recurrence rate: 18%), did not receive chemo or radiation or antiestrogen  Pathology and radiology counseling:Discussed with the patient, the details of pathology including the type of breast cancer,the clinical staging, the significance of ER, PR and HER-2/neu receptors and the implications for treatment. After reviewing the pathology in detail, we proceeded to discuss the different treatment options between surgery, radiation, chemotherapy, antiestrogen therapies.  Recommendations: 1. Breast conserving surgery followed by 2. Oncotype DX testing to determine if chemotherapy would be of any benefit followed by 3. Adjuvant radiation therapy followed by 4. Adjuvant antiestrogen therapy  Oncotype counseling: I discussed Oncotype DX test. I explained to the patient that this is a 21 gene panel to evaluate patient tumors DNA to calculate recurrence score. This would help determine whether patient has high risk or low risk breast cancer. She understands that if her tumor was found to be high risk, she would benefit from systemic chemotherapy. If low risk, no need of chemotherapy.  Return to clinic after surgery to discuss final pathology report and then determine if Oncotype DX testing will need to be sent.

## 2023-09-06 NOTE — Research (Signed)
 Trial:  [employer] 2021-05 - Specimen Collection Study to Evaluate Biomarkers in Subjects with Cancer   Patient Catherine Atkins was identified by Dr. Pamelia Hoit as a potential candidate for the above listed study.  This Clinical Research Coordinator met with Catherine Atkins, [id number], on 09/06/23 in a manner and location that ensures patient privacy to discuss participation in the above listed research study.  Patient is Unaccompanied.  A copy of the informed consent document with embedded HIPAA language was provided to the patient.  Patient reads, speaks, and understands [place].   Patient was provided with the business card of this Coordinator and encouraged to contact the research team with any questions.  Approximately 10 minutes were spent with the patient reviewing the informed consent documents.  Patient was provided the option of taking informed consent documents home to review and was encouraged to review at their convenience with their support network, including other care providers. Patient took the consent documents home to review. Pt was interested in the study. She agreed to come in at 11:00 AM on Friday to sign the consent form and have blood drawn. CRC thanked pt for her time and consideration on above study.   Sibyl Parr, Ph.D. Clinical Research Coordinator [phone] 09/06/23 2:56 PM

## 2023-09-06 NOTE — Progress Notes (Signed)
 [facility] Multidisciplinary Clinic Spiritual Care Note  Met with Catherine Atkins in Breast Multidisciplinary Clinic to introduce Support Center team/resources.  she completed SDOH screening; results follow below.    SDOH Screenings   Food Insecurity: No Food Insecurity (09/06/2023)  Housing: Low Risk  (09/06/2023)  Transportation Needs: No Transportation Needs (09/06/2023)  Utilities: Not At Risk (09/06/2023)  Depression (PHQ2-9): Low Risk  (09/06/2023)  Social Connections: Unknown (06/14/2022)   Received from [facility]  Tobacco Use: Low Risk  (09/06/2023)    Chaplain and patient discussed common feelings and emotions when being diagnosed with cancer, and the importance of support during treatment.  Chaplain informed patient of the support team and support services at [facility].  Chaplain provided contact information and encouraged patient to call with any questions or concerns.  Ms Kitchen reports that she lives alone with her dog and still works because she values the routine and social contact.  Follow up needed: No. Ms Bump plans to contact Spiritual Care as needed/desired.   [address] [place], [place], [facility] Pager [phone] Voicemail [phone]

## 2023-09-06 NOTE — Progress Notes (Signed)
 Radiation Oncology         [id number]) [phone] ________________________________  Name: Catherine Atkins        MRN: [id number]  Date of Service: 09/06/2023 DOB: 09/02/1950  JY:[id number], Channing Mutters, MD  Harriette Bouillon, MD     REFERRING PHYSICIAN: Harriette Bouillon, MD   DIAGNOSIS: The encounter diagnosis was Malignant neoplasm of upper-inner quadrant of right breast in female, estrogen receptor positive (HCC).  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 female with a history of left sided breast cancer in 2013 s/p left mastectomy and tamoxifen x5 years seen in the multidisciplinary breast clinic for a new diagnosis of left breast cancer. The patient was noted to have an abnormality on screening mammogram. She proceeded with diagnostic mammogram and ultrasound on 08/28/2023 that showed a 0.9 cm in the 2:00 position in the left breast. Accordingly, patient underwent a biopsy on 08/31/2023 that revealed grade high invasive ductal carcinoma that was ER and PR positive and HER2 negative with a Ki-67 25%.   She is seen today to discuss treatment recommendations of her cancer.      PREVIOUS RADIATION THERAPY: No   PAST MEDICAL HISTORY:  Past Medical History:  Diagnosis Date   Breast cancer (HCC) 01/25/2012   left mastectomy   Dental crowns present    Depression    History of breast cancer 2013   Hypertension    states is under control with meds., has been on med. 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 Jena Gauss: diverticulosis, diminutive rectosigmoid polyps removed/cold biopsy.   COLONOSCOPY WITH PROPOFOL N/A 07/09/2018   Procedure: COLONOSCOPY WITH PROPOFOL;  Surgeon: Corbin Ade, MD;  [facility];  Service: Endoscopy;  Laterality: N/A;  2:00pm   DEBRIDEMENT AND CLOSURE WOUND Left 11/06/2015   Procedure: EXCISION OF LEFT CHEST CHRONIC SEROMA ;  Surgeon: Glenna Fellows, MD;  Location: [facility];  Service: Plastics;  Laterality: Left;   MASTECTOMY Left 01/25/2012   no radiation no chemo   POLYPECTOMY  07/09/2018   Procedure: POLYPECTOMY;  Surgeon: Corbin Ade, MD;  [facility];  Service: Endoscopy;;  cecal polyps x3      FAMILY HISTORY:  Family History  Problem Relation Age of Onset   Cancer Mother        breast   Breast cancer Mother 39   Leukemia Father    Cancer Sister        breast   Breast cancer Sister 18   Heart attack Brother    Prostate cancer Maternal Uncle    Colon cancer Neg Hx      SOCIAL HISTORY:  reports that she has never smoked. She has never used smokeless tobacco. She reports that she does not drink alcohol and does not use drugs.   ALLERGIES:  Codeine   MEDICATIONS:  Current Outpatient Medications  Medication Sig Dispense Refill   acetaminophen (TYLENOL) 500 MG tablet Take 1,000 mg by mouth every 6 (six) hours as needed for moderate pain.     amLODipine-benazepril (LOTREL) 10-40 MG capsule Take 1 capsule by mouth daily.     atenolol (TENORMIN) 25 MG tablet Take 50 mg by mouth daily.      buPROPion (WELLBUTRIN XL) 150 MG 24 hr tablet Take 450 mg by mouth daily.      escitalopram (LEXAPRO) 5 MG tablet Take 5 mg by mouth daily.   3   hydrochlorothiazide (HYDRODIURIL) 25 MG tablet Take 25 mg by mouth daily.     Multiple Vitamin (MULTIVITAMIN WITH MINERALS) TABS tablet Take 1 tablet by mouth daily.     No current facility-administered medications  for this encounter.     REVIEW OF SYSTEMS: On review of systems, the patient reports that she is doing well overall. No breast specific complaints are verbalized.        PHYSICAL EXAM:  Wt Readings from Last 3 Encounters:  09/06/23 235 lb 8 oz (106.8 kg)  08/29/23 236 lb 0.2 oz (107.1 kg)  05/02/23 235 lb (106.6 kg)   Temp Readings from Last 3 Encounters:  09/06/23 98.3 F (36.8 C) (Temporal)  04/29/22 (!) 97.3 F (36.3 C) (Oral)  03/15/22 98.2 F (36.8 C) (Tympanic)   BP Readings from Last 3 Encounters:  09/06/23 134/74  08/29/23 (!) 141/90  07/13/22 (!) 140/80   Pulse Readings from Last 3 Encounters:  09/06/23 77  04/29/22 67  03/15/22 68    In general this is a well appearing female in no acute distress. She's alert and oriented x4 and appropriate throughout the examination. Cardiopulmonary assessment is negative for acute distress and she exhibits normal effort. Bilateral breast exam is deferred.    ECOG = 0  0 - Asymptomatic (Fully active, able to carry on all predisease activities without restriction)  1 - Symptomatic but completely ambulatory (Restricted in physically strenuous activity but ambulatory and able to carry out work of a light or sedentary nature. For example, light housework, office work)  2 - Symptomatic, <50% in bed during the day (Ambulatory and capable of all self care but unable to carry out any work activities. Up and about more than 50% of waking hours)  3 - Symptomatic, >50% in bed, but not bedbound (Capable of only limited self-care, confined to bed or chair 50% or more of waking hours)  4 - Bedbound (Completely disabled. Cannot carry on any self-care. Totally confined to bed or chair)  5 - Death   Santiago Glad MM, Creech RH, Tormey DC, et al. [phone]). "Toxicity and response criteria of the [facility]". Am. Evlyn Clines. Oncol. 5 (6): 649-55    LABORATORY DATA:  Lab Results  Component Value Date   WBC 7.4 09/06/2023    HGB 12.0 09/06/2023   HCT 35.3 (L) 09/06/2023   MCV 95.7 09/06/2023   PLT 277 09/06/2023   Lab Results  Component Value Date   NA 142 09/06/2023   K 3.8 09/06/2023   CL 108 09/06/2023   CO2 27 09/06/2023   Lab Results  Component Value Date   ALT 17 09/06/2023   AST 20 09/06/2023   GGT 10 07/23/2021   ALKPHOS 55 09/06/2023   BILITOT 0.4 09/06/2023      RADIOGRAPHY: [place] RT BREAST BX W LOC DEV 1ST LESION IMG BX SPEC US GUIDE Addendum Date:  09/01/2023 ADDENDUM REPORT: 09/01/2023 12:11 ADDENDUM: Pathology revealed GRADE III INVASIVE POORLY DIFFERENTIATED DUCTAL ADENOCARCINOMA, RARE MICROCALCIFICATIONS PRESENT WITHIN INVASIVE TUMOR of the RIGHT breast, 2 o'clock, 8cmfn, (ribbon clip). This was found to be concordant by Dr. Mauri Reading Mir . Pathology results were discussed with the patient by telephone. The patient reported doing well after the biopsy with tenderness at the site. Post biopsy instructions and care were reviewed and questions were answered. The patient was encouraged to call [facility] for any additional concerns. My direct phone number was provided. The patient was referred to [facility] on September 06, 2023. Pathology results reported by Rene Kocher, RN on 09/01/2023. Electronically Signed   By: Acquanetta Belling M.D.   On: 09/01/2023 12:11   Result Date: 09/01/2023 CLINICAL DATA:  73 year old woman prior history of left mastectomy for IDC was found to have a new suspicious right breast mass. EXAM: ULTRASOUND GUIDED RIGHT BREAST CORE NEEDLE BIOPSY COMPARISON:  Previous exam(s). PROCEDURE: I met with the patient and we discussed the procedure of ultrasound-guided biopsy, including benefits and alternatives. We discussed the high likelihood of a successful procedure. We discussed the risks of the procedure, including infection, bleeding, tissue injury, clip migration, and inadequate sampling.  Informed written consent was given. The usual time-out protocol was performed immediately prior to the procedure. Lesion quadrant: Upper inner quadrant Using sterile technique and 1% Lidocaine as local anesthetic, under direct ultrasound visualization, a 14 gauge spring-loaded device was used to perform biopsy of the right breast mass (2 o'clock 8 CMFN) using a medial approach. At the conclusion of the procedure ribbon shaped tissue marker clip was deployed into the biopsy cavity. Follow up 2 view mammogram was performed and dictated separately. IMPRESSION: Ultrasound guided biopsy of right breast mass. No apparent complications. Electronically Signed: By: Acquanetta Belling M.D. On: 08/31/2023 10:31   MM CLIP PLACEMENT RIGHT Result Date: 08/31/2023 CLINICAL DATA:  Status post biopsy of right upper inner breast mass. EXAM: 3D DIAGNOSTIC RIGHT MAMMOGRAM POST ULTRASOUND BIOPSY COMPARISON:  Previous exam(s). ACR Breast Density Category b: There are scattered areas of fibroglandular density. FINDINGS: 3D Mammographic images were obtained following ultrasound guided biopsy of right breast mass. The biopsy marking clip is in expected position at the site of biopsy. IMPRESSION: Appropriate positioning of the ribbon shaped biopsy marking clip at the site of biopsy in the upper inner right breast. Final Assessment: Post Procedure Mammograms for Marker Placement Electronically Signed   By: Acquanetta Belling M.D.   On: 08/31/2023 10:32   MM 3D DIAGNOSTIC MAMMOGRAM UNILATERAL RIGHT BREAST Result Date: 08/28/2023 CLINICAL DATA:  Screening recall for a right breast mass. The patient has history of left breast cancer status post mastectomy in 2013. EXAM: DIGITAL DIAGNOSTIC UNILATERAL RIGHT MAMMOGRAM WITH TOMOSYNTHESIS AND CAD; ULTRASOUND RIGHT BREAST LIMITED TECHNIQUE: Right digital diagnostic mammography and breast tomosynthesis was performed. The images were evaluated with computer-aided detection. ; Targeted ultrasound examination  of the right breast was performed COMPARISON:  Previous exam(s). ACR Breast Density Category b: There are scattered areas of fibroglandular density. FINDINGS: Spot compression tomosynthesis images through the upper inner posterior right breast demonstrates a persistent irregular mass with angular and spiculated margins measuring approximately 1.2 cm. Ultrasound targeted to the right breast at 2 o'clock, 8 cm from the nipple demonstrates an irregular hypoechoic mass with indistinct margins measuring 0.9 x 0.7 x 0.9 cm. Ultrasound of the right axilla demonstrates multiple normal-appearing  lymph nodes. IMPRESSION: 1. There is a suspicious 0.9 cm mass in the right breast at 2 o'clock. 2.  No evidence of right axillary lymphadenopathy. RECOMMENDATION: Ultrasound guided biopsy is recommended for the right breast mass. We will schedule the patient for the procedure prior to her departure from breast imaging today. I have discussed the findings and recommendations with the patient. If applicable, a reminder letter will be sent to the patient regarding the next appointment. BI-RADS CATEGORY  5: Highly suggestive of malignancy. Electronically Signed   By: Frederico Hamman M.D.   On: 08/28/2023 14:00   [place] LIMITED ULTRASOUND INCLUDING AXILLA RIGHT BREAST Result Date: 08/28/2023 CLINICAL DATA:  Screening recall for a right breast mass. The patient has history of left breast cancer status post mastectomy in 2013. EXAM: DIGITAL DIAGNOSTIC UNILATERAL RIGHT MAMMOGRAM WITH TOMOSYNTHESIS AND CAD; ULTRASOUND RIGHT BREAST LIMITED TECHNIQUE: Right digital diagnostic mammography and breast tomosynthesis was performed. The images were evaluated with computer-aided detection. ; Targeted ultrasound examination of the right breast was performed COMPARISON:  Previous exam(s). ACR Breast Density Category b: There are scattered areas of fibroglandular density. FINDINGS: Spot compression tomosynthesis images through the upper inner posterior  right breast demonstrates a persistent irregular mass with angular and spiculated margins measuring approximately 1.2 cm. Ultrasound targeted to the right breast at 2 o'clock, 8 cm from the nipple demonstrates an irregular hypoechoic mass with indistinct margins measuring 0.9 x 0.7 x 0.9 cm. Ultrasound of the right axilla demonstrates multiple normal-appearing lymph nodes. IMPRESSION: 1. There is a suspicious 0.9 cm mass in the right breast at 2 o'clock. 2.  No evidence of right axillary lymphadenopathy. RECOMMENDATION: Ultrasound guided biopsy is recommended for the right breast mass. We will schedule the patient for the procedure prior to her departure from breast imaging today. I have discussed the findings and recommendations with the patient. If applicable, a reminder letter will be sent to the patient regarding the next appointment. BI-RADS CATEGORY  5: Highly suggestive of malignancy. Electronically Signed   By: Frederico Hamman M.D.   On: 08/28/2023 14:00   MM 3D SCREENING MAMMOGRAM UNILATERAL RIGHT BREAST Result Date: 08/16/2023 CLINICAL DATA:  Screening. History of LEFT mastectomy in 2013. EXAM: DIGITAL SCREENING UNILATERAL RIGHT MAMMOGRAM WITH CAD AND TOMOSYNTHESIS TECHNIQUE: Right screening digital craniocaudal and mediolateral oblique mammograms were obtained. Right screening digital breast tomosynthesis was performed. The images were evaluated with computer-aided detection. COMPARISON:  Previous exam(s). ACR Breast Density Category b: There are scattered areas of fibroglandular density. FINDINGS: In the right breast, a possible mass warrants further evaluation. The patient has had a LEFT mastectomy. IMPRESSION: Further evaluation is suggested for a possible mass in the right breast. RECOMMENDATION: Diagnostic mammogram and possibly ultrasound of the right breast. (Code:[id number]) The patient will be contacted regarding the findings, and additional imaging will be scheduled. BI-RADS CATEGORY  0:  Incomplete: Need additional imaging evaluation. Electronically Signed   By: Harmon Pier M.D.   On: 08/16/2023 12:52       IMPRESSION/PLAN: 1. 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 Mitzi Hansen discussed the pathology findings and reviewed the nature of early stage breast cancer. The consensus from the breast conference includes lumpectomy followed by adjuvant radiation and antihormone therapy. Dr. Mitzi Hansen recommends external beam radiotherapy to the breast following her lumpectomy to reduce risks of local recurrence followed by antiestrogen therapy. We discussed the risks, benefits, short, and long term effects of radiotherapy, as well as  the curative intent, and the patient is interested in proceeding. Dr. Mitzi Hansen discussed the delivery and logistics of radiotherapy and anticipates a course of 4 weeks of radiotherapy to the left breast with deep inspiration breath hold technique. We will see her back a few weeks after surgery to discuss the simulation process and anticipate starting radiotherapy about 4-6 weeks after surgery.   2. Possible genetic predisposition to malignancy. The patient is a candidate for genetic testing given her personal and family history. She will meet with our geneticist today in clinic.   In a visit lasting 60 minutes, greater than 50% of the time was spent face to face reviewing her case, as well as in preparation of, discussing, and coordinating the patient's care.  The above documentation reflects my direct findings during this shared patient visit. Please see the separate note by Dr. Mitzi Hansen on this date for the remainder of the patient's plan of care.    Joyice Faster, [place]    **Disclaimer: This note was dictated with voice recognition software. Similar sounding words can inadvertently be transcribed and this note may contain transcription errors which may not have been corrected upon publication of note.**

## 2023-09-06 NOTE — Progress Notes (Signed)
 [facility] CONSULT NOTE  Patient Care Team: Carylon Perches, MD as PCP - General (Internal Medicine) Axel Filler, Larna Daughters, NP as Nurse Practitioner (Hematology and Oncology) Glenna Fellows, MD as Consulting Physician (Plastic Surgery) Donnelly Angelica, RN as Oncology Nurse Navigator Pershing Proud, RN as Oncology Nurse Navigator Harriette Bouillon, MD as Consulting Physician (General Surgery) Serena Croissant, MD as Consulting Physician (Hematology and Oncology) Dorothy Puffer, MD as Consulting Physician (Radiation Oncology)  CHIEF COMPLAINTS/PURPOSE OF CONSULTATION:  Newly diagnosed breast cancer  HISTORY OF PRESENTING ILLNESS:  Ms. Catherine Atkins is a 73 year old who was treated for left-sided breast cancer in 2013 with a mastectomy followed by 5 years of tamoxifen therapy.  She had Oncotype score of 27 and did not receive chemotherapy.  She presented with a screening mammogram that detected an irregular mass in the right breast measuring 0.9 cm.  Axilla was negative.  Biopsy revealed grade 3 invasive ductal carcinoma ER 100% PR 90% Ki67 25%, HER2 negative by FISH.  She was presented this morning to the multidiscipline tumor board and she is here today by herself to discuss her treatment plan.   I reviewed her records extensively and collaborated the history with the patient.  SUMMARY OF ONCOLOGIC HISTORY: Oncology History  Cancer of left breast (HCC) (Resolved)  01/25/2012 Definitive Surgery   Left mastectomy: invasive ductal carcinoma, grade 2, ER+ (100%), PR+ (80%), HER2/neu negative, Ki67 10%   01/25/2012 Pathologic Stage   Stage IA: mpT1c N0   01/25/2012 Oncotype testing   RS 27 (18% ROR)   09/2012 -  Anti-estrogen oral therapy   Tamoxifen 20 mg daily   Malignant neoplasm of lower-outer quadrant of left breast of female, estrogen receptor positive (HCC)  2013 Initial Diagnosis   Left mastectomy (did not receive chemo or radiation or antiestrogens) Oncotype recurrence  score: 27 (distant recurrence rate: 18%)   Malignant neoplasm of upper-inner quadrant of right breast in female, estrogen receptor positive (HCC)  08/31/2023 Initial Diagnosis   Mammogram detected irregular mass right breast 0.9 cm, axilla negative, biopsy: Grade 3 IDC ER 100%, PR 90%, Ki67 25%, HER2 negative by [facility]      MEDICAL HISTORY:  Past Medical History:  Diagnosis Date   Breast cancer (HCC) 01/25/2012   left mastectomy   Dental crowns present    Depression    History of breast cancer 2013   Hypertension    states is under control with meds., has been on med. 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    SURGICAL HISTORY: Past Surgical History:  Procedure Laterality Date   ABDOMINAL HYSTERECTOMY  1989   complete   ABSCESS DRAINAGE     on neck   APPENDECTOMY     BREAST BIOPSY Right 08/31/2023   [place] RT BREAST BX W LOC DEV 1ST LESION IMG BX SPEC US GUIDE 08/31/2023 [facility]   COLONOSCOPY  2004   Dr. Jena Gauss: diverticulosis, diminutive rectosigmoid polyps removed/cold biopsy.   COLONOSCOPY WITH PROPOFOL N/A 07/09/2018   Procedure: COLONOSCOPY WITH PROPOFOL;  Surgeon: Corbin Ade, MD;  [facility];  Service: Endoscopy;  Laterality: N/A;  2:00pm   DEBRIDEMENT AND CLOSURE WOUND Left 11/06/2015   Procedure: EXCISION OF LEFT CHEST CHRONIC SEROMA ;  Surgeon: Glenna Fellows, MD;  Location: [facility];  Service: Plastics;  Laterality: Left;   MASTECTOMY Left 01/25/2012   no radiation no chemo   POLYPECTOMY  07/09/2018   Procedure:  POLYPECTOMY;  Surgeon: Corbin Ade, MD;  [facility];  Service: Endoscopy;;  cecal polyps x3     SOCIAL HISTORY: Social History   Socioeconomic History   Marital status: Widowed    Spouse name: Not on file   Number of children: 0   Years of education: Not on file   Highest education level: Not on file  Occupational History    Employer: [employer]     Comment: supervisor   Tobacco Use   Smoking status: Never   Smokeless tobacco: Never  Vaping Use   Vaping status: Never Used  Substance and Sexual Activity   Alcohol use: No   Drug use: No   Sexual activity: Not Currently    Birth control/protection: Surgical  Other Topics Concern   Not on file  Social History Narrative   Not on file   Social Drivers of Health   Financial Resource Strain: Not on file  Food Insecurity: Not on file  Transportation Needs: Not on file  Physical Activity: Not on file  Stress: Not on file  Social Connections: Unknown (06/14/2022)   [facility] from [facility], [facility]   Social Network    Social Network: Not on file  Intimate Partner Violence: Unknown (06/14/2022)   Received from [employer], [facility]   HITS    Physically Hurt: Not on file    Insult or Talk Down To: Not on file    Threaten Physical Harm: Not on file    Scream or Curse: Not on file    FAMILY HISTORY: Family History  Problem Relation Age of Onset   Cancer Mother        breast   Breast cancer Mother 22   Leukemia Father    Cancer Sister        breast   Breast cancer Sister 32   Heart attack Brother    Prostate cancer Maternal Uncle    Colon cancer Neg Hx     ALLERGIES:  is allergic to codeine.  MEDICATIONS:  Current Outpatient Medications  Medication Sig Dispense Refill   acetaminophen (TYLENOL) 500 MG tablet Take 1,000 mg by mouth every 6 (six) hours as needed for moderate pain.     amLODipine-benazepril (LOTREL) 10-40 MG capsule Take 1 capsule by mouth daily.     atenolol (TENORMIN) 25 MG tablet Take 50 mg by mouth daily.      buPROPion (WELLBUTRIN XL) 150 MG 24 hr tablet Take 450 mg by mouth daily.      escitalopram (LEXAPRO) 5 MG tablet Take 5 mg by mouth daily.   3   hydrochlorothiazide (HYDRODIURIL) 25 MG tablet Take 25 mg by mouth daily.     Multiple Vitamin (MULTIVITAMIN WITH MINERALS) TABS tablet Take 1 tablet by mouth daily.     No current  facility-administered medications for this visit.    REVIEW OF SYSTEMS:   Constitutional: Denies fevers, chills or abnormal night sweats Breast:  Denies any palpable lumps or discharge All other systems were reviewed with the patient and are negative.  PHYSICAL EXAMINATION: ECOG PERFORMANCE STATUS: 1 - Symptomatic but completely ambulatory  Vitals:   09/06/23 1343  BP: 134/74  Pulse: 77  Resp: 18  Temp: 98.3 F (36.8 C)  SpO2: 99%   Filed Weights   09/06/23 1343  Weight: 235 lb 8 oz (106.8 kg)    GENERAL:alert, no distress and comfortable    LABORATORY DATA:  I have reviewed the data as listed Lab Results  Component Value Date   WBC 7.4 09/06/2023   HGB 12.0 09/06/2023   HCT 35.3 (L) 09/06/2023   MCV 95.7 09/06/2023   PLT 277 09/06/2023   Lab Results  Component Value Date   NA 142 09/06/2023   K 3.8 09/06/2023   CL 108 09/06/2023   CO2 27 09/06/2023    RADIOGRAPHIC STUDIES: I have personally reviewed the radiological reports and agreed with the findings in the report.  ASSESSMENT AND PLAN:  Malignant neoplasm of upper-inner quadrant of right breast in female, estrogen receptor positive (HCC) 08/31/2023:Mammogram detected irregular mass right breast 0.9 cm, axilla negative, biopsy: Grade 3 IDC ER 100%, PR 90%, Ki67 25%, HER2 negative by [facility]  2013: Left mastectomy: Oncotype score 27 (distant recurrence rate: 18%), did not receive chemo or radiation or antiestrogen  Pathology and radiology counseling:Discussed with the patient, the details of pathology including the type of breast cancer,the clinical staging, the significance of ER, PR and HER-2/neu receptors and the implications for treatment. After reviewing the pathology in detail, we proceeded to discuss the different treatment options between surgery, radiation, chemotherapy, antiestrogen therapies.  Recommendations: 1. Breast conserving surgery followed by 2. Adjuvant radiation therapy followed by 3.  Adjuvant antiestrogen therapy  Based on her age, we decided not to do Oncotype testing.    All questions were answered. The patient knows to call the clinic with any problems, questions or concerns.    Tamsen Meek, MD 09/06/23

## 2023-09-07 ENCOUNTER — Encounter: Payer: Self-pay | Admitting: Genetic Counselor

## 2023-09-08 ENCOUNTER — Telehealth: Payer: Self-pay | Admitting: *Deleted

## 2023-09-08 ENCOUNTER — Other Ambulatory Visit: Payer: Self-pay

## 2023-09-08 ENCOUNTER — Inpatient Hospital Stay

## 2023-09-08 ENCOUNTER — Inpatient Hospital Stay: Admitting: *Deleted

## 2023-09-08 ENCOUNTER — Encounter: Payer: Self-pay | Admitting: *Deleted

## 2023-09-08 ENCOUNTER — Ambulatory Visit: Payer: Self-pay | Admitting: Surgery

## 2023-09-08 DIAGNOSIS — C50211 Malignant neoplasm of upper-inner quadrant of right female breast: Secondary | ICD-10-CM

## 2023-09-08 DIAGNOSIS — C50911 Malignant neoplasm of unspecified site of right female breast: Secondary | ICD-10-CM

## 2023-09-08 LAB — RESEARCH LABS

## 2023-09-08 NOTE — Telephone Encounter (Signed)
 Left vm regarding [facility] from 09/06/23. Contact information provided for questions or needs.

## 2023-09-08 NOTE — Research (Signed)
 Trial Name:  [employer] 2021-05 - Specimen Collection Study to Evaluate Biomarkers in Subjects with Cancer    Patient Catherine Atkins was identified by Dr. Pamelia Hoit as a potential candidate for the above listed study.  This Clinical Research Coordinator met with Catherine Atkins [place], [id number] on 09/08/23 in a manner and location that ensures patient privacy to discuss participation in the above listed research study.  Patient is Unaccompanied.  Patient was previously provided with informed consent documents.  Patient confirmed they have read the informed consent documents.  As outlined in the informed consent form, this Coordinator and Catherine Atkins discussed the purpose of the research study, the investigational nature of the study, study procedures and requirements for study participation, potential risks and benefits of study participation, as well as alternatives to participation.  This study is not blinded or double-blinded. The patient understands participation is voluntary and they may withdraw from study participation at any time.  This study does not involve randomization.  This study does not involve an investigational drug or device. This study does not involve a placebo. Patient understands enrollment is pending full eligibility review.   Confidentiality and how the patient's information will be used as part of study participation were discussed.  Patient was informed there is reimbursement provided for their time and effort spent on trial participation.    All questions were answered to patient's satisfaction.  The informed consent with embedded HIPAA language was reviewed page by page.  The patient's mental and emotional status is appropriate to provide informed consent, and the patient verbalizes an understanding of study participation.  Patient has agreed to participate in the above listed research study and has voluntarily signed the informed consent with embedded HIPAA language,  version 19 Jun 2020 (revised 05 Jul 2021)  on 09/08/23 at 11:05AM.  The patient was provided with a copy of the signed informed consent form with embedded HIPAA language for their reference.  No study specific procedures were obtained prior to the signing of the informed consent document.  Approximately 15 minutes were spent with the patient reviewing the informed consent documents.  Patient was not requested to complete a Release of Information form.   Eligibility: Eligibility criteria reviewed with patient. This nurse/coordinator has reviewed this patient's inclusion and exclusion criteria and confirmed patient is eligible for study participation. Eligibility confirmed by treating investigator, who also agrees that patient should proceed with enrollment. Patient will continue with enrollment.  Data Collection: Patient was interviewed to collect the following information.  Medical History:  High Blood Pressure  Yes Coronary Artery Disease No Lupus    No Rheumatoid Arthritis  No Diabetes   No      Lynch Syndrome  No  Is the patient currently taking a magnesium supplement?   No  Does the patient have a personal history of cancer (greater than 5 years ago)?  Yes If yes, Cancer type and date of diagnosis?   Breast Cancer, 12/07/2011  Has this previous diagnosis been treated? Yes      If so, treatment type? Surgery, Tamoxifen   Start and end dates of last treatment cycle? Surgery: 01/25/2012; Tamoxifen:April 2014 - March 2019  Does the patient have a family history of cancer in 1st or 2nd degree relatives? Yes If yes, Relationship(s) and Cancer type(s)? Mother: Breast Cancer, Sister: Breast Cancer; Brothers x2, Prostate Cancer  Does the patient have history of alcohol consumption? Yes   If yes, current or former? current Number of years? 54  Drinks per week? 1 drink per year on average.  Does the patient have history of cigarette, cigar, pipe, or chewing tobacco use?  No   Blood Collection:  Research blood obtained by [facility] venipuncture. Patient tolerated well without any adverse events.  Gift Card: $50 gift card given to patient for her participation in this study.    Patient was thanked for their participation in this study.    Sibyl Parr, Ph.D. Clinical Research Coordinator [phone] 09/08/23 11:43 AM

## 2023-09-08 NOTE — Research (Signed)
 [employer] 2021-05 - Specimen Collection Study to Evaluate Biomarkers in Subjects with Cancer   This Nurse has reviewed this patient's inclusion and exclusion criteria as a second review and confirms Catherine Atkins is eligible for study participation.  Patient may continue with enrollment.  Domenica Reamer, BSN, RN, [employer] Research Nurse II [phone] 09/08/2023

## 2023-09-11 ENCOUNTER — Encounter: Payer: Self-pay | Admitting: *Deleted

## 2023-09-11 ENCOUNTER — Other Ambulatory Visit: Payer: Self-pay | Admitting: Surgery

## 2023-09-11 DIAGNOSIS — C50211 Malignant neoplasm of upper-inner quadrant of right female breast: Secondary | ICD-10-CM

## 2023-09-11 DIAGNOSIS — C50912 Malignant neoplasm of unspecified site of left female breast: Secondary | ICD-10-CM

## 2023-09-14 ENCOUNTER — Telehealth: Payer: Self-pay | Admitting: Hematology and Oncology

## 2023-09-14 NOTE — Telephone Encounter (Signed)
 Left vm about scheduled appt date and time.

## 2023-09-18 ENCOUNTER — Other Ambulatory Visit: Payer: Self-pay

## 2023-09-18 ENCOUNTER — Encounter (HOSPITAL_BASED_OUTPATIENT_CLINIC_OR_DEPARTMENT_OTHER): Payer: Self-pay | Admitting: Surgery

## 2023-09-20 ENCOUNTER — Encounter (HOSPITAL_BASED_OUTPATIENT_CLINIC_OR_DEPARTMENT_OTHER)
Admission: RE | Admit: 2023-09-20 | Discharge: 2023-09-20 | Disposition: A | Discharge status: Home / Self Care | Source: Ambulatory Visit | Attending: Surgery | Admitting: Surgery

## 2023-09-20 DIAGNOSIS — I10 Essential (primary) hypertension: Secondary | ICD-10-CM | POA: Diagnosis not present

## 2023-09-20 DIAGNOSIS — Z01810 Encounter for preprocedural cardiovascular examination: Secondary | ICD-10-CM | POA: Insufficient documentation

## 2023-09-20 MED ORDER — CHLORHEXIDINE GLUCONATE CLOTH 2 % EX PADS
6.0000 | MEDICATED_PAD | Freq: Once | CUTANEOUS | Status: DC
Start: 2023-09-20 — End: 2023-09-26

## 2023-09-20 MED ORDER — CHLORHEXIDINE GLUCONATE CLOTH 2 % EX PADS: 6.0000 | MEDICATED_PAD | Freq: Once | CUTANEOUS | Status: DC

## 2023-09-20 NOTE — Progress Notes (Signed)

## 2023-09-21 ENCOUNTER — Encounter: Payer: Self-pay | Admitting: *Deleted

## 2023-09-21 ENCOUNTER — Ambulatory Visit
Admission: RE | Admit: 2023-09-21 | Discharge: 2023-09-21 | Disposition: A | Discharge status: Home / Self Care | Source: Ambulatory Visit | Attending: Surgery | Admitting: Surgery

## 2023-09-21 DIAGNOSIS — C50911 Malignant neoplasm of unspecified site of right female breast: Secondary | ICD-10-CM

## 2023-09-21 HISTORY — PX: BREAST BIOPSY: SHX20

## 2023-09-25 NOTE — Anesthesia Preprocedure Evaluation (Signed)
 Anesthesia Evaluation  Patient identified by MRN, date of birth, ID band Patient awake    Reviewed: Allergy & Precautions, NPO status , Patient's Chart, lab work & pertinent test results  History of Anesthesia Complications (+) history of anesthetic complications (prolonged emergence after mastectomy)  Airway Mallampati: III  TM Distance: >3 FB Neck ROM: Full    Dental  (+) Dental Advisory Given   Pulmonary neg pulmonary ROS   Pulmonary exam normal breath sounds clear to auscultation       Cardiovascular hypertension (amlodipine-benazepril, atenolol , HCTZ), Pt. on medications and Pt. on home beta blockers (-) angina (-) Past MI, (-) Cardiac Stents and (-) CABG + dysrhythmias (1st degree AV block)  Rhythm:Regular Rate:Normal     Neuro/Psych  PSYCHIATRIC DISORDERS Anxiety Depression    negative neurological ROS     GI/Hepatic negative GI ROS, Neg liver ROS,,,  Endo/Other  negative endocrine ROS    Renal/GU negative Renal ROS     Musculoskeletal   Abdominal  (+) + obese  Peds  Hematology negative hematology ROS (+) Lab Results      Component                Value               Date                      WBC                      7.4                 09/06/2023                HGB                      12.0                09/06/2023                HCT                      35.3 (L)            09/06/2023                MCV                      95.7                09/06/2023                PLT                      277                 09/06/2023              Anesthesia Other Findings   Reproductive/Obstetrics Breast cancer                              Anesthesia Physical Anesthesia Plan  ASA: 2  Anesthesia Plan: General   Post-op Pain Management: Tylenol  PO (pre-op)*   Induction: Intravenous  PONV Risk Score and Plan: 3 and Ondansetron , Dexamethasone , Propofol  infusion, TIVA and Treatment  may vary due to age or medical condition  Airway Management Planned: LMA  Additional Equipment:   Intra-op Plan:   Post-operative Plan: Extubation in OR  Informed Consent: I have reviewed the patients History and Physical, chart, labs and discussed the procedure including the risks, benefits and alternatives for the proposed anesthesia with the patient or authorized representative who has indicated his/her understanding and acceptance.     Dental advisory given  Plan Discussed with: CRNA and Anesthesiologist  Anesthesia Plan Comments: (Risks of general anesthesia discussed including, but not limited to, sore throat, hoarse voice, chipped/damaged teeth, injury to vocal cords, nausea and vomiting, allergic reactions, lung infection, heart attack, stroke, and death. All questions answered. )         Anesthesia Quick Evaluation

## 2023-09-26 ENCOUNTER — Other Ambulatory Visit: Payer: Self-pay

## 2023-09-26 ENCOUNTER — Encounter (HOSPITAL_BASED_OUTPATIENT_CLINIC_OR_DEPARTMENT_OTHER)
Admission: RE | Disposition: A | Discharge status: Home / Self Care | Payer: Self-pay | Source: Home / Self Care | Attending: Surgery

## 2023-09-26 ENCOUNTER — Ambulatory Visit
Admission: RE | Admit: 2023-09-26 | Discharge: 2023-09-26 | Disposition: A | Discharge status: Home / Self Care | Source: Ambulatory Visit | Attending: Surgery | Admitting: Surgery

## 2023-09-26 ENCOUNTER — Ambulatory Visit (HOSPITAL_BASED_OUTPATIENT_CLINIC_OR_DEPARTMENT_OTHER): Admitting: Anesthesiology

## 2023-09-26 ENCOUNTER — Encounter (HOSPITAL_BASED_OUTPATIENT_CLINIC_OR_DEPARTMENT_OTHER): Payer: Self-pay | Admitting: Surgery

## 2023-09-26 ENCOUNTER — Ambulatory Visit (HOSPITAL_BASED_OUTPATIENT_CLINIC_OR_DEPARTMENT_OTHER)
Admission: RE | Admit: 2023-09-26 | Discharge: 2023-09-26 | Disposition: A | Discharge status: Home / Self Care | Attending: Surgery | Admitting: Surgery

## 2023-09-26 DIAGNOSIS — C50211 Malignant neoplasm of upper-inner quadrant of right female breast: Secondary | ICD-10-CM | POA: Diagnosis present

## 2023-09-26 DIAGNOSIS — C50912 Malignant neoplasm of unspecified site of left female breast: Secondary | ICD-10-CM

## 2023-09-26 DIAGNOSIS — F419 Anxiety disorder, unspecified: Secondary | ICD-10-CM | POA: Insufficient documentation

## 2023-09-26 DIAGNOSIS — Z1732 Human epidermal growth factor receptor 2 negative status: Secondary | ICD-10-CM | POA: Insufficient documentation

## 2023-09-26 DIAGNOSIS — I440 Atrioventricular block, first degree: Secondary | ICD-10-CM | POA: Insufficient documentation

## 2023-09-26 DIAGNOSIS — I10 Essential (primary) hypertension: Secondary | ICD-10-CM | POA: Diagnosis not present

## 2023-09-26 DIAGNOSIS — Z170 Estrogen receptor positive status [ER+]: Secondary | ICD-10-CM | POA: Diagnosis present

## 2023-09-26 DIAGNOSIS — E669 Obesity, unspecified: Secondary | ICD-10-CM | POA: Diagnosis not present

## 2023-09-26 DIAGNOSIS — F32A Depression, unspecified: Secondary | ICD-10-CM | POA: Diagnosis not present

## 2023-09-26 DIAGNOSIS — Z1721 Progesterone receptor positive status: Secondary | ICD-10-CM | POA: Insufficient documentation

## 2023-09-26 DIAGNOSIS — Z6833 Body mass index (BMI) 33.0-33.9, adult: Secondary | ICD-10-CM | POA: Insufficient documentation

## 2023-09-26 DIAGNOSIS — C50911 Malignant neoplasm of unspecified site of right female breast: Secondary | ICD-10-CM

## 2023-09-26 HISTORY — PX: BREAST LUMPECTOMY WITH RADIOACTIVE SEED LOCALIZATION: SHX6424

## 2023-09-26 HISTORY — DX: Anxiety disorder, unspecified: F41.9

## 2023-09-26 SURGERY — BREAST LUMPECTOMY WITH RADIOACTIVE SEED LOCALIZATION
Anesthesia: General | Site: Breast | Laterality: Right

## 2023-09-26 MED ORDER — FENTANYL CITRATE (PF) 100 MCG/2ML IJ SOLN
INTRAMUSCULAR | Status: DC | PRN
  Administered 2023-09-26: 50 ug via INTRAVENOUS
  Administered 2023-09-26 (×2): 25 ug via INTRAVENOUS

## 2023-09-26 MED ORDER — CEFAZOLIN SODIUM-DEXTROSE 2-4 GM/100ML-% IV SOLN
INTRAVENOUS | Status: AC
  Filled 2023-09-26: qty 100

## 2023-09-26 MED ORDER — OXYCODONE HCL 5 MG PO TABS: 5.0000 mg | ORAL_TABLET | Freq: Four times a day (QID) | ORAL | 0 refills | Status: AC | PRN

## 2023-09-26 MED ORDER — OXYCODONE HCL 5 MG PO TABS: 5.0000 mg | ORAL_TABLET | Freq: Once | ORAL | Status: DC | PRN

## 2023-09-26 MED ORDER — LIDOCAINE 2% (20 MG/ML) 5 ML SYRINGE
INTRAMUSCULAR | Status: DC | PRN
  Administered 2023-09-26: 100 mg via INTRAVENOUS

## 2023-09-26 MED ORDER — FENTANYL CITRATE (PF) 100 MCG/2ML IJ SOLN
INTRAMUSCULAR | Status: AC
  Filled 2023-09-26: qty 2

## 2023-09-26 MED ORDER — PROPOFOL 500 MG/50ML IV EMUL
INTRAVENOUS | Status: AC
  Filled 2023-09-26: qty 50

## 2023-09-26 MED ORDER — PROPOFOL 10 MG/ML IV BOLUS
INTRAVENOUS | Status: DC | PRN
  Administered 2023-09-26: 150 mg via INTRAVENOUS
  Administered 2023-09-26: 30 mg via INTRAVENOUS

## 2023-09-26 MED ORDER — OXYCODONE HCL 5 MG/5ML PO SOLN: 5.0000 mg | Freq: Once | ORAL | Status: DC | PRN

## 2023-09-26 MED ORDER — BUPIVACAINE-EPINEPHRINE (PF) 0.25% -1:200000 IJ SOLN
INTRAMUSCULAR | Status: DC | PRN
  Administered 2023-09-26: 20 mL

## 2023-09-26 MED ORDER — FENTANYL CITRATE (PF) 100 MCG/2ML IJ SOLN
25.0000 ug | INTRAMUSCULAR | Status: DC | PRN
Start: 2023-09-26 — End: 2023-09-26

## 2023-09-26 MED ORDER — PROPOFOL 500 MG/50ML IV EMUL
INTRAVENOUS | Status: DC | PRN
  Administered 2023-09-26: 125 ug/kg/min via INTRAVENOUS

## 2023-09-26 MED ORDER — 0.9 % SODIUM CHLORIDE (POUR BTL) OPTIME
TOPICAL | Status: DC | PRN
Start: 2023-09-26 — End: 2023-09-26
  Administered 2023-09-26: 100 mL

## 2023-09-26 MED ORDER — BUPIVACAINE-EPINEPHRINE (PF) 0.25% -1:200000 IJ SOLN
INTRAMUSCULAR | Status: AC
  Filled 2023-09-26: qty 30

## 2023-09-26 MED ORDER — ONDANSETRON HCL 4 MG/2ML IJ SOLN
INTRAMUSCULAR | Status: DC | PRN
  Administered 2023-09-26: 4 mg via INTRAVENOUS

## 2023-09-26 MED ORDER — ACETAMINOPHEN 500 MG PO TABS
ORAL_TABLET | ORAL | Status: AC
  Filled 2023-09-26: qty 2

## 2023-09-26 MED ORDER — ACETAMINOPHEN 500 MG PO TABS
1000.0000 mg | ORAL_TABLET | Freq: Once | ORAL | Status: AC
  Administered 2023-09-26: 1000 mg via ORAL

## 2023-09-26 MED ORDER — AMISULPRIDE (ANTIEMETIC) 5 MG/2ML IV SOLN: 10.0000 mg | Freq: Once | INTRAVENOUS | Status: DC | PRN

## 2023-09-26 MED ORDER — LACTATED RINGERS IV SOLN: INTRAVENOUS | Status: DC

## 2023-09-26 MED ORDER — DEXAMETHASONE SODIUM PHOSPHATE 10 MG/ML IJ SOLN
INTRAMUSCULAR | Status: DC | PRN
  Administered 2023-09-26: 5 mg via INTRAVENOUS

## 2023-09-26 MED ORDER — CEFAZOLIN SODIUM-DEXTROSE 3-4 GM/150ML-% IV SOLN
3.0000 g | INTRAVENOUS | Status: AC
  Administered 2023-09-26: 3 g via INTRAVENOUS

## 2023-09-26 SURGICAL SUPPLY — 41 items
BINDER BREAST LRG (GAUZE/BANDAGES/DRESSINGS) IMPLANT
BINDER BREAST MEDIUM (GAUZE/BANDAGES/DRESSINGS) IMPLANT
BINDER BREAST XLRG (GAUZE/BANDAGES/DRESSINGS) IMPLANT
BINDER BREAST XXLRG (GAUZE/BANDAGES/DRESSINGS) IMPLANT
BLADE SURG 15 STRL LF DISP TIS (BLADE) ×1 IMPLANT
CANISTER SUC SOCK COL 7IN (MISCELLANEOUS) IMPLANT
CANISTER SUCT 1200ML W/VALVE (MISCELLANEOUS) IMPLANT
CHLORAPREP W/TINT 26 (MISCELLANEOUS) ×1 IMPLANT
CLIP APPLIE 9.375 MED OPEN (MISCELLANEOUS) IMPLANT
COVER BACK TABLE 60X90IN (DRAPES) ×1 IMPLANT
COVER MAYO STAND STRL (DRAPES) ×1 IMPLANT
COVER PROBE CYLINDRICAL 5X96 (MISCELLANEOUS) ×1 IMPLANT
DERMABOND ADVANCED .7 DNX12 (GAUZE/BANDAGES/DRESSINGS) ×1 IMPLANT
DRAPE LAPAROSCOPIC ABDOMINAL (DRAPES) IMPLANT
DRAPE LAPAROTOMY 100X72 PEDS (DRAPES) ×1 IMPLANT
DRAPE UTILITY XL STRL (DRAPES) ×1 IMPLANT
ELECT COATED BLADE 2.86 ST (ELECTRODE) ×1 IMPLANT
ELECTRODE REM PT RTRN 9FT ADLT (ELECTROSURGICAL) ×1 IMPLANT
GLOVE BIOGEL PI IND STRL 8 (GLOVE) ×1 IMPLANT
GLOVE ECLIPSE 8.0 STRL XLNG CF (GLOVE) ×1 IMPLANT
GOWN STRL REUS W/ TWL LRG LVL3 (GOWN DISPOSABLE) ×2 IMPLANT
GOWN STRL REUS W/ TWL XL LVL3 (GOWN DISPOSABLE) ×1 IMPLANT
HEMOSTAT ARISTA ABSORB 3G PWDR (HEMOSTASIS) IMPLANT
HEMOSTAT SNOW SURGICEL 2X4 (HEMOSTASIS) IMPLANT
KIT MARKER MARGIN INK (KITS) ×1 IMPLANT
NDL HYPO 25X1 1.5 SAFETY (NEEDLE) ×1 IMPLANT
NEEDLE HYPO 25X1 1.5 SAFETY (NEEDLE) ×1 IMPLANT
NS IRRIG 1000ML POUR BTL (IV SOLUTION) ×1 IMPLANT
PACK BASIN DAY SURGERY FS (CUSTOM PROCEDURE TRAY) ×1 IMPLANT
PENCIL SMOKE EVACUATOR (MISCELLANEOUS) ×1 IMPLANT
SLEEVE SCD COMPRESS KNEE MED (STOCKING) ×1 IMPLANT
SPIKE FLUID TRANSFER (MISCELLANEOUS) IMPLANT
SPONGE T-LAP 4X18 ~~LOC~~+RFID (SPONGE) ×1 IMPLANT
SUT MNCRL AB 4-0 PS2 18 (SUTURE) ×1 IMPLANT
SUT SILK 2 0 SH (SUTURE) IMPLANT
SUT VICRYL 3-0 CR8 SH (SUTURE) ×1 IMPLANT
SYR CONTROL 10ML LL (SYRINGE) ×1 IMPLANT
TOWEL GREEN STERILE FF (TOWEL DISPOSABLE) ×1 IMPLANT
TRAY FAXITRON CT DISP (TRAY / TRAY PROCEDURE) ×1 IMPLANT
TUBE CONNECTING 20X1/4 (TUBING) IMPLANT
YANKAUER SUCT BULB TIP NO VENT (SUCTIONS) IMPLANT

## 2023-09-26 NOTE — Op Note (Signed)
 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  Additional margin taken.  All tissue was oriented with ink  Drains: None  Indications for procedure: The patient is a 73 year old female with stage I right breast cancer.  She opted for breast conserving surgery after being seen in a multidisciplinary clinic setting and all options were explained to her as well as breast conserving surgery versus mastectomy versus reconstruction.  The pros and cons of lymph node mapping were also explained as well as potential complications from that.  Omission of lymph node mapping was also reviewed as well.  She opted to proceed with lumpectomy alone after being seen by all disciplines.The procedure has been discussed with the patient. Alternatives to surgery have been discussed with the patient.  Risks of surgery include bleeding,  Infection,  Seroma formation, death,  and the need for further surgery.   The patient understands and wishes to proceed.    Description of procedure: The patient was met in the holding area and questions were answered.  Right breast marked as correct site and films were available for review after seed placement done yesterday.  She was taken back to the operating room.  She was placed supine upon the operative room table.  After induction of general anesthesia, right breast was prepped and draped in sterile fashion timeout performed.  Neoprobe used to identify the seed in the right upper inner quadrant.  A transverse incision was made after infiltration of the skin with local anesthetic.  A wide excision of the tumor was then done with the cautery.  This was to the skin and down to the pectoralis muscle.  The  entire mass was removed in its entirety.  Imaging revealed the seed and clip to be in the specimen but the medial margin of the close therefore I took an additional medial margin.  All tissue was oriented with ink and fixated and sent to pathology.  Margins were grossly negative.  The cavity was then irrigated.  Hemostasis achieved with cautery.  Clips were used to mark the cavity.  Local anesthetic infiltrated throughout.  Deep tissue planes were then approximated with 3-0 Vicryl.  4 Monocryl was used to close the skin in a subcuticular fashion.  Dermabond applied.  All counts found to be correct.  Breast binder placed.  The patient was awoke extubated taken to recovery in satisfactory condition.

## 2023-09-26 NOTE — H&P (Signed)
 Chief Complaint: Breast Cancer  History of Present Illness: Catherine Atkins is a 73 y.o. female who is seen today as an office consultation for evaluation of Breast Cancer  Patient seen in the [facility] for evaluation of newly diagnosed right breast cancer detected on mammogram. History of left breast cancer treated in 2013 with left mastectomy. Patient denies any complaints of breast pain breast mass or nipple discharge. This was detected on screening mammogram.  Review of Systems: A complete review of systems was obtained from the patient. I have reviewed this information and discussed as appropriate with the patient. See HPI as well for other ROS.    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  History of breast cancer  Hot flashes due to tamoxifen   Malignant neoplasm of upper-inner quadrant of right breast in female, estrogen receptor positive (CMS/HHS-HCC)  Polypharmacy  Right-sided tinnitus  Sensorineural hearing loss, bilateral  Vitamin D  deficiency  Encounter for screening colonoscopy  Easy bruising  Tendency toward bleeding easily ()  Depression  Nervously anxious   Past Surgical History:  Procedure Laterality Date  left total mastectomy with sentinel lymph node Left 01/25/2012  EXCISION OF LEFT CHEST CHRONIC SEROMA Left 11/06/2015  COLONOSCOPY WITH PROPOFOL  N/A 07/09/2018  POLYPECTOMY - cecal polyps x3 N/A 07/09/2018  Right breast biopsy Right 08/31/2023    Allergies  Allergen Reactions  Codeine Itching   Current Outpatient Medications on File Prior to Visit  Medication Sig Dispense Refill  acetaminophen  (TYLENOL ) 500 MG tablet Take 1,000 mg by mouth every 6 (six) hours as needed  amLODIPine-benazepril (LOTREL) 10-40 mg capsule Take 1 capsule by mouth once daily  atenoloL  (TENORMIN ) 25 MG tablet Take 50 mg by mouth once daily   buPROPion  (WELLBUTRIN  XL) 150 MG XL tablet Take 450 mg by mouth once daily  escitalopram oxalate (LEXAPRO) 5 MG tablet Take 5 mg by mouth once daily  hydroCHLOROthiazide (HYDRODIURIL) 25 MG tablet Take 25 mg by mouth once daily  multivitamin with minerals tablet Take 1 tablet by mouth once daily   No current facility-administered medications on file prior to visit.   Family History  Problem Relation Age of Onset  Breast cancer Mother  Diabetes Mother  High blood pressure (Hypertension) Mother  Obesity Mother  High blood pressure (Hypertension) Father  Stroke Father  Breast cancer Sister  Diabetes Brother  Coronary Artery Disease (Blocked arteries around heart) Brother  High blood pressure (Hypertension) Brother  Skin cancer Brother    Social History   Tobacco Use  Smoking Status Never  Smokeless Tobacco Never    Social History   Socioeconomic History  Marital status: Widowed  Tobacco Use  Smoking status: Never  Smokeless tobacco: Never  Vaping Use  Vaping status: Never Used  Substance and Sexual Activity  Alcohol use: Never  Drug use: Never   Social Drivers of Health   Received from [employer]  Social Network   Objective:  There were no vitals filed for this visit.  There is no height or weight on file to calculate BMI.  Physical Exam Exam conducted with a chaperone present.  Cardiovascular:  Rate and Rhythm: Normal rate.  Pulses: Normal pulses.  Chest:  Breasts: Breasts are asymmetrical.  Right: Normal.  Left: Absent.   Comments: Left mastectomy noted. Musculoskeletal:  General: Normal range of motion.  Lymphadenopathy:  Upper Body:  Right  upper body: No axillary adenopathy.  Left upper body: No axillary adenopathy.  Neurological:  General: No focal deficit present.  Mental Status: She is alert.  Psychiatric:  Mood and Affect: Mood normal.     Labs, Imaging and Diagnostic Testing:  INAL DIAGNOSIS   1. Breast, right, needle core  biopsy, 2 o'clock, 8cmfn, ribbon clip :  INVASIVE POORLY DIFFERENTIATED DUCTAL ADENOCARCINOMA, GRADE 3 (3+2+3)  TUBULAR FORMATION: SCORE 3  NUCLEAR PLEOMORPHISM: SCORE 2  MITOTIC COUNT: SCORE 3  TOTAL SCORE: 8  OVERALL GRADE: GRADE 3 (8/9)  RARE MICROCALCIFICATIONS PRESENT WITHIN INVASIVE TUMOR  NEGATIVE FOR ANGIOLYMPHATIC INVASION  TUMOR MEASURES 6.5 MM IN GREATEST LINEAR EXTENT   Diagnosis Note : Immunohistochemical stains for the breast prognostic markers  have been ordered, and these results will be issued within a subsequent addendum  to this report.  Case is reviewed by Dr. Talmadge Fail who concurs with the interpretation.  Diagnosis called to [facility] by Dr.  Kavin Parsley on 09/01/2023 at 9:47 AM.   DATE SIGNED OUT: 09/01/2023  ELECTRONIC SIGNATURE : Picklesimer Md, Fred , Sports administrator, Electronic Signature   MICROSCOPIC DESCRIPTION   CASE COMMENTS  STAINS USED IN DIAGNOSIS:  H&E-2  H&E-3  H&E-4  H&E  *RECUT 1 SLIDE  Stains used in diagnosis 1 Her2 by IHC, 1 ER-ACIS, 1 KI-67-ACIS, 1 PR-ACIS, 1  Her2 FISH  IHC scores are reported using ASCO/CAP scoring criteria. An IHC Score of 0 or  1+ is NEGATIVE for HER2, 3+ is POSITIVE for HER2, and 2+ is EQUIVOCAL.  Equivocal results are reflexed to either FISH or IHC testing. Specimens are  fixed in 10% Neutral Buffered Formalin for at least 6 hours and up to 72 hours.  These tests have not be validated on decalcified tissue. Results should be  interpreted with caution given the possibility of false negative results on  decalcified specimens. Antibody Clone for HER2 is 4B5 ([facility]WAY). Some of these  immunohistochemical stains may have been developed and the performance  characteristics determined by [facility]. Some may not have been  cleared or approved by the U.S. Food and Drug Administration. The FDA has  determined that such clearance or approval is not necessary. This test is used  for  clinical purposes. It should not be regarded as investigational or for  research. This laboratory is certified under the Clinical Laboratory  Improvement Amendments of 1988 (CLIA-88) as qualified to perform high complexity  clinical laboratory testing.  Estrogen receptor (6F11), immunohistochemical stains are performed on formalin  fixed, paraffin embedded tissue using a 3,3"-diaminobenzidine (DAB) chromogen  and Leica Bond Autostainer System. The staining intensity of the nucleus is  scored manually and is reported as the percentage of tumor cell nuclei  demonstrating specific nuclear staining.Specimens are fixed in 10% Neutral  Buffered Formalin for at least 6 hours and up to 72 hours. These tests have not  be validated on decalcified tissue. Results should be interpreted with caution  given the possibility of false negative results on decalcified specimens.  Ki-67 (MM1), immunohistochemical stains are performed on formalin fixed,  paraffin embedded tissue using a 3,3"-diaminobenzidine (DAB) chromogen and Leica  Bond Autostainer System. The staining intensity of the nucleus is scored  manually and is reported as the percentage of tumor cell nuclei demonstrating  specific nuclear staining.Specimens are fixed in 10% Neutral Buffered Formalin  for at least 6 hours and up to 72 hours. These tests have not be validated on  decalcified  tissue. Results should be interpreted with caution given the  possibility of false negative results on decalcified specimens.  PR progesterone receptor (16), immunohistochemical stains are performed on  formalin fixed, paraffin embedded tissue using a 3,3"-diaminobenzidine (DAB)  chromogen and Leica Bond Autostainer System. The staining intensity of the  nucleus is scored manually and is reported as the percentage of tumor cell  nuclei demonstrating specific nuclear staining.Specimens are fixed in 10%  Neutral Buffered Formalin for at least 6 hours and up to 72  hours. These tests  have not be validated on decalcified tissue. Results should be interpreted with  caution given the possibility of false negative results on decalcified  specimens.  HER2 IQFISH pharmDX (code [phone]) is a direct fluorescence in-situ hybridization  assay designed to quantitatively determine HER2 gene amplification in  formalin-fixed, paraffin-embedded tissue specimens. Results are reported using  ASCO/CAP and manufacturer's scoring criteria. GROUP 1: Ratio of HER2/CEN 17  >=2.0 and average HER2 >=4.0 signals/cell = POSITIVE. GROUP 2: Ratio of HER2  /CEN 17 >=2.0 and average HER2 < 4.0 signals/cell with an IHC of 2+ = Review of  20 additional cells. If score is still >=2.0 and average HER2 < 4.0 = NEGATIVE.  GROUP 3: Ratio of Her2/CEN 17 < 2.0 and average HER2 >=6.0 signals/cell with an  IHC of 2+ = Review of 20 additional cells. If score is still < 2.0 and average  HER2 >=6.0 signals/cell= POSITIVE. GROUP 4: Ratio of Her2 /CEN 17 < 2.0 and  average HER2 >=4.0 and < 6.0 signals/cell with an IHC score of 2+ =Review of 20  additional cells. If score is still < 2.0 and average HER2 >=4.0 and < 6.0  signals/cell= NEGATIVE. GROUP 5: Ratio of HER2/CEN 17 < 2.0 and average HER2 < 4.0 signals/cell = NEGATIVE Specimens are fixed in 10% Neutral Buffered  Formalin for at least 6 hours and up to 72 hours. These tests have not been  validated on decalcified tissue. Results should be interpreted with caution  given the possibility of false negative results on decalcified specimens.   ADDENDUM  1A-BREAST,RIGHT,NEEDLE CORE BIOPSY  FLUORESCENCE IN-SITU HYBRIDIZATION   Results:  GROUP 5: HER2 **NEGATIVE**   Equivocal form of amplification of the HER2 gene was detected in the IHC 2+ tissue sample received from this individual. HER2 FISH was performed by a  technologist and cell imaging and analysis on the BioView.  RATIO OF HER2/CEN17 SIGNALS 1.75  AVERAGE HER2 COPY NUMBER PER CELL 2.45   The ratio of HER2/CEN 17 is within the range < 2.0 of HER2/CEN 17 and a copy number of HER2 signals per cell is <4.0.  Arch Pathol Lab Med 1:1,2018  Earleen Glazier, John, Pathologist, Electronic Signature  ( Signed [phone])  Breast, right, needle core biopsy, 2 o'clock, 8cmfn, ribbon clip  PROGNOSTIC INDICATORS   Results:  IMMUNOHISTOCHEMICAL AND MORPHOMETRIC ANALYSIS PERFORMED MANUALLY  The tumor cells are EQUIVOCAL for Her2 (2+). Her2 by FISH will be performed and the results reported separately.  Estrogen Receptor: 100%, POSITIVE, STRONG STAINING INTENSITY  Progesterone Receptor: 90%, POSITIVE, MODERATE-STRONG STAINING INTENSITY  Proliferation Marker Ki67: 25%  REFERENCE RANGE ESTROGEN RECEPTOR  NEGATIVE 0%  POSITIVE =>1%  REFERENCE RANGE PROGESTERONE RECEPTOR  NEGATIVE 0%  POSITIVE =>1%  All controls stained appropriately  Earleen Glazier, John, Pathologist, Electronic Signature  [id number] 01 2025)   CLINICAL DATA: Screening recall for a right breast mass. The  patient has history of left breast cancer status post mastectomy  in  2013.   EXAM:  DIGITAL DIAGNOSTIC UNILATERAL RIGHT MAMMOGRAM WITH TOMOSYNTHESIS AND  CAD; ULTRASOUND RIGHT BREAST LIMITED   TECHNIQUE:  Right digital diagnostic mammography and breast tomosynthesis was  performed. The images were evaluated with computer-aided detection.  ; Targeted ultrasound examination of the right breast was performed   COMPARISON: Previous exam(s).   ACR Breast Density Category b: There are scattered areas of  fibroglandular density.   FINDINGS:  Spot compression tomosynthesis images through the upper inner  posterior right breast demonstrates a persistent irregular mass with  angular and spiculated margins measuring approximately 1.2 cm.   Ultrasound targeted to the right breast at 2 o'clock, 8 cm from the  nipple demonstrates an irregular hypoechoic mass with indistinct  margins measuring 0.9 x 0.7 x 0.9 cm. Ultrasound  of the right axilla  demonstrates multiple normal-appearing lymph nodes.   IMPRESSION:  1. There is a suspicious 0.9 cm mass in the right breast at 2  o'clock.   2. No evidence of right axillary lymphadenopathy.   RECOMMENDATION:  Ultrasound guided biopsy is recommended for the right breast mass.  We will schedule the patient for the procedure prior to her  departure from breast imaging today.   I have discussed the findings and recommendations with the patient.  If applicable, a reminder letter will be sent to the patient  regarding the next appointment.   BI-RADS CATEGORY 5: Highly suggestive of malignancy.   Assessment and Plan:   Diagnoses and all orders for this visit:  Malignant neoplasm of upper-inner quadrant of right breast in female, estrogen receptor positive (CMS/HHS-HCC)   Discussed options with the patient given her previous diagnosis and surgery. Patient is opted for a right breast seed localized lumpectomy. She has a desire to see plastic surgery to consider reconstruction. If she does that this can be a combined procedure. If not, we will proceed with right breast seed lumpectomy alone. Discussed the omission of sentinel up to mapping today and the pros and cons of this as well as reviewing the data. Reviewed complications of sentinel lymph node mapping as well. The procedure has been discussed with the patient. Alternatives to surgery have been discussed with the patient. Risks of surgery include bleeding, Infection, Seroma formation, death, and the need for further surgery. The patient understands and wishes to proceed.    Sharlee Dawes, MD

## 2023-09-26 NOTE — Anesthesia Postprocedure Evaluation (Signed)
 Anesthesia Post Note  Patient: Catherine Atkins  Procedure(s) Performed: BREAST LUMPECTOMY WITH RADIOACTIVE SEED LOCALIZATION (Right: Breast)     Patient location during evaluation: PACU Anesthesia Type: General Level of consciousness: awake Pain management: pain level controlled Vital Signs Assessment: post-procedure vital signs reviewed and stable Respiratory status: spontaneous breathing, nonlabored ventilation and respiratory function stable Cardiovascular status: blood pressure returned to baseline and stable Postop Assessment: no apparent nausea or vomiting Anesthetic complications: no   No notable events documented.  Last Vitals:  Vitals:   09/26/23 1200 09/26/23 1228  BP: (!) 147/93 112/64  Pulse: (!) 53 (!) 54  Resp: 11 20  Temp:  36.7 C  SpO2: 97% 98%    Last Pain:  Vitals:   09/26/23 1228  TempSrc:   PainSc: 1                  Conard Decent

## 2023-09-26 NOTE — Interval H&P Note (Signed)
 History and Physical Interval Note:  09/26/2023 10:14 AM  Catherine Atkins  has presented today for surgery, with the diagnosis of RIGHT BREAST CANCER.  The various methods of treatment have been discussed with the patient and family. After consideration of risks, benefits and other options for treatment, the patient has consented to  Procedure(s) with comments: BREAST LUMPECTOMY WITH RADIOACTIVE SEED LOCALIZATION (Right) - right breast seed lumpectomy as a surgical intervention.  The patient's history has been reviewed, patient examined, no change in status, stable for surgery.  I have reviewed the patient's chart and labs.  Questions were answered to the patient's satisfaction.     Catherine Atkins A Railee Bonillas

## 2023-09-26 NOTE — Discharge Instructions (Addendum)
 [facility] Office Phone Number [phone]  BREAST BIOPSY/ PARTIAL MASTECTOMY: POST OP INSTRUCTIONS  Always review your discharge instruction sheet given to you by the facility where your surgery was performed.  IF YOU HAVE DISABILITY OR FAMILY LEAVE FORMS, YOU MUST BRING THEM TO THE OFFICE FOR PROCESSING.  DO NOT GIVE THEM TO YOUR DOCTOR.  A prescription for pain medication may be given to you upon discharge.  Take your pain medication as prescribed, if needed.  If narcotic pain medicine is not needed, then you may take acetaminophen  (Tylenol ) or ibuprofen (Advil) as needed. Take your usually prescribed medications unless otherwise directed If you need a refill on your pain medication, please contact your pharmacy.  They will contact our office to request authorization.  Prescriptions will not be filled after 5pm or on week-ends. You should eat very light the first 24 hours after surgery, such as soup, crackers, pudding, etc.  Resume your normal diet the day after surgery. Most patients will experience some swelling and bruising in the breast.  Ice packs and a good support bra will help.  Swelling and bruising can take several days to resolve.  It is common to experience some constipation if taking pain medication after surgery.  Increasing fluid intake and taking a stool softener will usually help or prevent this problem from occurring.  A mild laxative (Milk of Magnesia or Miralax) should be taken according to package directions if there are no bowel movements after 48 hours. Unless discharge instructions indicate otherwise, you may remove your bandages 24-48 hours after surgery, and you may shower at that time.  You may have steri-strips (small skin tapes) in place directly over the incision.  These strips should be left on the skin for 7-10 days.  If your surgeon used skin glue on the incision, you may shower in 24 hours.  The glue will flake off over the next 2-3 weeks.  Any  sutures or staples will be removed at the office during your follow-up visit. ACTIVITIES:  You may resume regular daily activities (gradually increasing) beginning the next day.  Wearing a good support bra or sports bra minimizes pain and swelling.  You may have sexual intercourse when it is comfortable. You may drive when you no longer are taking prescription pain medication, you can comfortably wear a seatbelt, and you can safely maneuver your car and apply brakes. RETURN TO WORK:  ______________________________________________________________________________________ Catherine Atkins should see your doctor in the office for a follow-up appointment approximately two weeks after your surgery.  Your doctor's nurse will typically make your follow-up appointment when she calls you with your pathology report.  Expect your pathology report 2-3 business days after your surgery.  You may call to check if you do not hear from us  after three days. OTHER INSTRUCTIONS: _______________________________________________________________________________________________ _____________________________________________________________________________________________________________________________________ _____________________________________________________________________________________________________________________________________ _____________________________________________________________________________________________________________________________________  WHEN TO CALL YOUR DOCTOR: Fever over 101.0 Nausea and/or vomiting. Extreme swelling or bruising. Continued bleeding from incision. Increased pain, redness, or drainage from the incision.  The clinic staff is available to answer your questions during regular business hours.  Please don't hesitate to call and ask to speak to one of the nurses for clinical concerns.  If you have a medical emergency, go to the nearest emergency room or call 911.  A surgeon from [facility] is always on call at the hospital.  For further questions, please visit [email or web address]   Post Anesthesia Home Care Instructions  Activity: Get plenty of rest for the remainder of the  day. A responsible individual must stay with you for 24 hours following the procedure.  For the next 24 hours, DO NOT: -Drive a car -Advertising copywriter -Drink alcoholic beverages -Take any medication unless instructed by your physician -Make any legal decisions or sign important papers.  Meals: Start with liquid foods such as gelatin or soup. Progress to regular foods as tolerated. Avoid greasy, spicy, heavy foods. If nausea and/or vomiting occur, drink only clear liquids until the nausea and/or vomiting subsides. Call your physician if vomiting continues.  Special Instructions/Symptoms: Your throat may feel dry or sore from the anesthesia or the breathing tube placed in your throat during surgery. If this causes discomfort, gargle with warm salt water . The discomfort should disappear within 24 hours.  If you had a scopolamine  patch placed behind your ear for the management of post- operative nausea and/or vomiting:  1. The medication in the patch is effective for 72 hours, after which it should be removed.  Wrap patch in a tissue and discard in the trash. Wash hands thoroughly with soap and water . 2. You may remove the patch earlier than 72 hours if you experience unpleasant side effects which may include dry mouth, dizziness or visual disturbances. 3. Avoid touching the patch. Wash your hands with soap and water  after contact with the patch.      Next dose of tylenol  may be taken at 3p

## 2023-09-26 NOTE — Transfer of Care (Signed)
 Immediate Anesthesia Transfer of Care Note  Patient: Catherine Atkins  Procedure(s) Performed: BREAST LUMPECTOMY WITH RADIOACTIVE SEED LOCALIZATION (Right: Breast)  Patient Location: PACU  Anesthesia Type:General  Level of Consciousness: awake and patient cooperative  Airway & Oxygen Therapy: Patient Spontanous Breathing and Patient connected to face mask oxygen  Post-op Assessment: Report given to RN and Post -op Vital signs reviewed and stable  Post vital signs: Reviewed and stable  Last Vitals:  Vitals Value Taken Time  BP 131/67 09/26/23 1130  Temp    Pulse 52 09/26/23 1136  Resp 12 09/26/23 1136  SpO2 99 % 09/26/23 1136  Vitals shown include unfiled device data.  Last Pain:  Vitals:   09/26/23 0853  TempSrc: Temporal  PainSc: 0-No pain      Patients Stated Pain Goal: 4 (09/26/23 0853)  Complications: No notable events documented.

## 2023-09-26 NOTE — Anesthesia Procedure Notes (Signed)
 Procedure Name: LMA Insertion Date/Time: 09/26/2023 10:36 AM  Performed by: Glorya Larsson, CRNAPre-anesthesia Checklist: Patient identified, Emergency Drugs available, Suction available and Patient being monitored Patient Re-evaluated:Patient Re-evaluated prior to induction Oxygen Delivery Method: Circle System Utilized Preoxygenation: Pre-oxygenation with 100% oxygen Induction Type: IV induction Ventilation: Mask ventilation without difficulty LMA: LMA inserted LMA Size: 4.0 Number of attempts: 1 Airway Equipment and Method: Bite block Placement Confirmation: positive ETCO2 Tube secured with: Tape Dental Injury: Teeth and Oropharynx as per pre-operative assessment

## 2023-09-27 ENCOUNTER — Encounter (HOSPITAL_BASED_OUTPATIENT_CLINIC_OR_DEPARTMENT_OTHER): Payer: Self-pay | Admitting: Surgery

## 2023-09-28 LAB — SURGICAL PATHOLOGY

## 2023-09-29 ENCOUNTER — Encounter: Payer: Self-pay | Admitting: Surgery

## 2023-09-29 ENCOUNTER — Encounter: Payer: Self-pay | Admitting: *Deleted

## 2023-10-04 ENCOUNTER — Encounter: Payer: Self-pay | Admitting: Genetic Counselor

## 2023-10-04 ENCOUNTER — Ambulatory Visit: Payer: Self-pay | Admitting: Genetic Counselor

## 2023-10-04 DIAGNOSIS — Z8042 Family history of malignant neoplasm of prostate: Secondary | ICD-10-CM

## 2023-10-04 DIAGNOSIS — Z1509 Genetic susceptibility to other malignant neoplasm: Secondary | ICD-10-CM

## 2023-10-04 DIAGNOSIS — Z170 Estrogen receptor positive status [ER+]: Secondary | ICD-10-CM

## 2023-10-04 DIAGNOSIS — Z1501 Genetic susceptibility to malignant neoplasm of breast: Secondary | ICD-10-CM

## 2023-10-04 DIAGNOSIS — Z803 Family history of malignant neoplasm of breast: Secondary | ICD-10-CM

## 2023-10-04 DIAGNOSIS — Z853 Personal history of malignant neoplasm of breast: Secondary | ICD-10-CM

## 2023-10-04 HISTORY — DX: Genetic susceptibility to other malignant neoplasm: Z15.09

## 2023-10-04 NOTE — Progress Notes (Addendum)
 GENETIC TEST RESULTS   Patient Name: Catherine Atkins Patient Age: 73 y.o. Encounter Date: 10/04/2023  Referring Provider: Cameron Cea, MD   Catherine Atkins was seen in the Cancer Genetics clinic on September 06, 2023 due to a personal and family history of cancer and concern regarding a hereditary predisposition to cancer in the family. Please refer to the prior Genetics clinic note for more information regarding Catherine Atkins's medical and family histories and our assessment at the time.   FAMILY HISTORY:  We obtained a detailed, 4-generation family history.  Significant diagnoses are listed below: Family History  Problem Relation Age of Onset   Breast cancer Mother        dx late 23s   Leukemia Father        dx 84s   Breast cancer Sister        dx 53s   Heart attack Brother    Prostate cancer Brother    Prostate cancer Brother    Prostate cancer Maternal Uncle        x2 maternal uncles   Colon cancer Neg Hx       Catherine Atkins is unaware of previous family history of genetic testing for hereditary cancer risks. Other relatives are unavailable for genetic testing at this time.   There is no reported Ashkenazi Jewish ancestry. There is no known consanguinity.  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 Specifically, this variant is  [id number] ([id number]).  No other deleterious variants were detected in the [facility] CancerNext-Expanded +RNAinsight Panel.  The CancerNext-Expanded gene panel offered by [facility] and includes sequencing, rearrangement, and RNA analysis for the following 76 genes: AIP, ALK, APC, ATM, AXIN2, BAP1, BARD1, BMPR1A, BRCA1, BRCA2, BRIP1, CDC73, CDH1, CDK4, CDKN1B, CDKN2A, CEBPA, CHEK2, CTNNA1, DDX41, DICER1, ETV6, FH, FLCN, GATA2, LZTR1, MAX, MBD4, MEN1, MET, MLH1, MSH2, MSH3, MSH6, MUTYH, NF1, NF2, NTHL1, PALB2, PHOX2B, PMS2, POT1, PRKAR1A, PTCH1, PTEN, RAD51C, RAD51D, RB1, RET, RUNX1, SDHA, SDHAF2,  SDHB, SDHC, SDHD, SMAD4, SMARCA4, SMARCB1, SMARCE1, STK11, SUFU, TMEM127, TP53, TSC1, TSC2, VHL, and WT1 (sequencing and deletion/duplication); EGFR, HOXB13, KIT, MITF, PDGFRA, POLD1, and POLE (sequencing only); EPCAM and GREM1 (deletion/duplication only).    The test report has been scanned into EPIC and is located under the Molecular Pathology section of the Results Review tab.  A portion of the result report is included below for reference. Genetic testing reported out on October 02, 2023.     Cancer Risks for ATM: Females have a 21-24% lifetime risk of breast cancer. 2-3% lifetime risk for epithelial ovarian cancer 5-10% lifetime risk for pancreatic cancer  There is emerging evidence suggesting an increased risk for prostate cancer.  Research is continuing to help learn more about the cancers associated with ATM pathogenic variants and what the exact risks are to develop these cancers.  Management Recommendations:  Breast Screening/Risk Reduction: Breast cancer screening includes: Breast awareness beginning at age 47 Monthly self-breast examination beginning at age 19 Clinical breast examination every 6-12 months beginning at age 79 or at the age of the earliest diagnosed breast cancer in the family, if onset was before age 42 Annual mammogram with consideration of tomosynthesis starting at age 78 or 10 years prior to the youngest age of diagnosis, whichever comes first Consider breast MRI with and without contrast starting at age 75-35 Evidence is insufficient for a prophylactic risk-reducing mastectomy, manage based on family history  For patients who are treated for  breast cancer and have not had bilateral mastectomy, screening should continue as described  Ovarian Cancer Screening/Risk Reduction: Evidence insufficient for risk-reducing salpingo oophorectomy; manage based on family history Seek prompt evaluation with onset of signs/symptoms related to ovarian cancer If there is a  family history of ovarian cancer, have a discussion with your physician about the benefits and limitations of screening and risk reducing strategies  Pancreatic Cancer Screening/Risk Reduction: Avoid smoking, heavy alcohol use, and obesity. Consider pancreatic cancer screening. Screening includes annual endoscopic ultrasound and/or MRI of the pancreas starting at age 7 or 59 years younger than the earliest age diagnosis in the family.   Prostate Cancer Screening: Consider beginning annual PSA blood test and digital rectal exams at age 29.  Additional considerations: There is insufficient evidence to recommend against radiation therapy.  Individuals with a single pathogenic ATM variant are also carriers of ataxia telangiectasia. Ataxia telangiectasia is associated with childhood cancer risks as well as other medical problems (such as difficulty with movement, balance and coordination problems, neuropathy, and weakened immunity). For there to be a risk of ataxia telangiectasia in offspring, both the patient and their partner would each have to carry a pathogenic variant in ATM; in this case, the risk to have an affected child is 25%.  This information is based on current understanding of the gene and may change in the future.  Implications for Family Members: Hereditary predisposition to cancer due to pathogenic variants in the ATM gene has autosomal dominant inheritance. This means that first degree relatives (children, full siblings, parents) have a 50% chance of having the same ATM gene mutation.  More distant relatives also have an increased chance of having the familial ATM gene mutation. Identification of a pathogenic variant allows for the recognition of at-risk relatives who can pursue testing for the familial variant.   Family members are recommended to consider genetic testing for this familial pathogenic variant. As there are generally no childhood cancer risks associated with pathogenic  variants in the ATM gene, individuals in the family are not recommended to have testing until they reach at least 75 years of age. They may contact our office at [phone] for more information or to schedule an appointment. Complimentary testing for the familial variant is available for 90 days. Family members who live outside of the area are encouraged to find a genetic counselor in their area by visiting: [email or web address].  Resources: FORCE (Facing Our Risk of Cancer Empowered) is a resource for those with a hereditary predisposition to develop cancer.  FORCE provides information about risk reduction, advocacy, legislation, and clinical trials.  Additionally, [employer]CE provides a platform for collaboration and support; which includes: peer navigation, message boards, local support groups, a toll-free helpline, research registry and recruitment, advocate training, published medical research, webinars, brochures, mastectomy photos, and more.  For more information, visit www.facingourrisk.org  Plan: Breast: consider annual breast MRIs.  She plans to speak with Dr. Gudena about this further.  Ovarian: Catherine Atkins reported having a BSO in her late 30s due to a pelvic mass (pathology report not available).  Discussed very small residual risk of primary peritoneal cancer remains.  Encouraged prompt reporting of any GYN -related symptoms.  Pancreatic cancer: consider pancreatic cancer screening.  Offered referral to [facility] to discuss benefits and limitations of pancreatic screening.  She prefers to speak with Dr. Lee Public prior to deciding whehter she wants to proceed with referral.  Family letter provided to encourage family testing.    We encouraged Ms.  Atkins to remain in contact with us  on an annual basis so we can update her personal and family histories, and let her know of advances in cancer genetics that may benefit the family. Our contact number was provided. Catherine Atkins  questions were answered to her satisfaction today, and she knows she is welcome to call anytime with additional questions.   Catherine Marszalek M. Ora Billing, MS, [facility] Genetic Counselor [email or web address] [phone]) [phone]

## 2023-10-11 ENCOUNTER — Ambulatory Visit: Admitting: Hematology and Oncology

## 2023-10-18 NOTE — Assessment & Plan Note (Signed)
 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 I discussed the margins as well as lymph node surgeries. We also discussed the final staging along with previously performed ER/PR and HER-2/neu testing.  Treatment plan: Adjuvant radiation therapy followed by Adjuvant antiestrogen therapy  We did do not to do Oncotype DX based upon her age and wishes Return to clinic after radiation is completed

## 2023-10-19 ENCOUNTER — Inpatient Hospital Stay: Attending: Hematology and Oncology | Admitting: Hematology and Oncology

## 2023-10-19 VITALS — BP 117/69 | HR 79 | Temp 98.1°F | Resp 17 | Ht 69.5 in | Wt 228.4 lb

## 2023-10-19 DIAGNOSIS — C50211 Malignant neoplasm of upper-inner quadrant of right female breast: Secondary | ICD-10-CM

## 2023-10-19 DIAGNOSIS — Z170 Estrogen receptor positive status [ER+]: Secondary | ICD-10-CM | POA: Diagnosis not present

## 2023-10-19 NOTE — Progress Notes (Signed)
 Patient Care Team: Artemisa Bile, MD as PCP - General (Internal Medicine) Debbie Fails, Laura Polio, NP as Nurse Practitioner (Hematology and Oncology) Alger Infield, MD as Consulting Physician (Plastic Surgery) Alane Hsu, RN as Oncology Nurse Navigator Auther Bo, RN as Oncology Nurse Navigator Sim Dryer, MD as Consulting Physician (General Surgery) Cameron Cea, MD as Consulting Physician (Hematology and Oncology) Johna Myers, MD as Consulting Physician (Radiation Oncology)  DIAGNOSIS:  Encounter Diagnosis  Name Primary?   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  Report date is 10/02/2023.   The CancerNext-Expanded gene panel offered by [facility] and includes sequencing, rearrangement, and RNA analysis for the following 76 genes: AIP, ALK, APC, ATM, AXIN2, BAP1, BARD1, BMPR1A, BRCA1, BRCA2, BRIP1, CDC73, CDH1, CDK4, CDKN1B, CDKN2A, CEBPA, CHEK2, CTNNA1, DDX41, DICER1, ETV6, FH, FLCN, GATA2, LZTR1, MAX, MBD4, MEN1, MET, MLH1, MSH2, MSH3, MSH6, MUTYH, NF1, NF2, NTHL1, PALB2, PHOX2B, PMS2, POT1, PRKAR1A, PTCH1, PTEN, RAD51C, RAD51D, RB1, RET, RUNX1, SDHA, SDHAF2, SDHB, SDHC, SDHD, SMAD4, SMARCA4, SMARCB1, SMARCE1, STK11, SUFU, TMEM127, TP53, TSC1, TSC2, VHL, and WT1 (sequencing and deletion/duplication); EGFR, HOXB13, KIT, MITF, PDGFRA, POLD1, and POLE (sequencing only); EPCAM and GREM1 (deletion/duplication  only).       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  Report date is 10/02/2023.   The CancerNext-Expanded gene panel offered by [facility] and includes sequencing, rearrangement, and RNA analysis for the following 76 genes: AIP, ALK, APC, ATM, AXIN2, BAP1, BARD1, BMPR1A, BRCA1, BRCA2, BRIP1, CDC73, CDH1, CDK4, CDKN1B, CDKN2A, CEBPA, CHEK2, CTNNA1, DDX41, DICER1, ETV6, FH, FLCN, GATA2, LZTR1, MAX, MBD4, MEN1, MET, MLH1, MSH2, MSH3, MSH6, MUTYH, NF1, NF2, NTHL1, PALB2, PHOX2B, PMS2, POT1, PRKAR1A, PTCH1, PTEN, RAD51C, RAD51D, RB1, RET, RUNX1, SDHA, SDHAF2, SDHB, SDHC, SDHD, SMAD4, SMARCA4, SMARCB1, SMARCE1, STK11, SUFU, TMEM127, TP53, TSC1, TSC2, VHL, and WT1 (sequencing and deletion/duplication); EGFR, HOXB13, KIT, MITF, PDGFRA, POLD1, and POLE (sequencing only); EPCAM and GREM1 (deletion/duplication only).       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  Report date is 10/02/2023.   The CancerNext-Expanded gene panel offered by [facility] and includes sequencing, rearrangement, and RNA analysis for the following 76 genes: AIP,  ALK, APC, ATM, AXIN2,  BAP1, BARD1, BMPR1A, BRCA1, BRCA2, BRIP1, CDC73, CDH1, CDK4, CDKN1B, CDKN2A, CEBPA, CHEK2, CTNNA1, DDX41, DICER1, ETV6, FH, FLCN, GATA2, LZTR1, MAX, MBD4, MEN1, MET, MLH1, MSH2, MSH3, MSH6, MUTYH, NF1, NF2, NTHL1, PALB2, PHOX2B, PMS2, POT1, PRKAR1A, PTCH1, PTEN, RAD51C, RAD51D, RB1, RET, RUNX1, SDHA, SDHAF2, SDHB, SDHC, SDHD, SMAD4, SMARCA4, SMARCB1, SMARCE1, STK11, SUFU, TMEM127, TP53, TSC1, TSC2, VHL, and WT1 (sequencing and deletion/duplication); EGFR, HOXB13, KIT, MITF, PDGFRA, POLD1, and POLE (sequencing only); EPCAM and GREM1 (deletion/duplication only).         CHIEF COMPLIANT: Follow-up of recent breast surgery  HISTORY OF PRESENT ILLNESS:  History of Present Illness Catherine Atkins "Catherine Atkins" is a 73 year old female who presents for follow-up after recent breast cancer surgery.  She recently underwent surgery for breast cancer on the right side. The tumor measured 2.5 cm, with negative resection margins and no lymphovascular invasion. She has a previous mastectomy on the left side, raising concerns about recurrence.  She is worried about potential side effects of radiation therapy, particularly fatigue, as she continues to work as a Merchandiser, retail at [employer]. She is considering taking time off work if necessary during radiation therapy.  She recalls previous treatment with tamoxifen  and is informed about considering anastrozole  after completing radiation therapy.     ALLERGIES:  is allergic to codeine.  MEDICATIONS:  Current Outpatient Medications  Medication Sig Dispense Refill   acetaminophen  (TYLENOL ) 500 MG tablet Take 1,000 mg by mouth every 6 (six) hours as needed for moderate pain.     amLODipine-benazepril (LOTREL) 10-40 MG capsule Take 1 capsule by mouth daily.     atenolol  (TENORMIN ) 25 MG tablet Take 50 mg by mouth daily.      buPROPion  (WELLBUTRIN  XL) 150 MG 24 hr tablet Take 450 mg by mouth daily.      cholecalciferol  (VITAMIN D3) 25 MCG (1000 UNIT) tablet  Take 1,000 Units by mouth daily.     cyanocobalamin (VITAMIN B12) 100 MCG tablet Take 100 mcg by mouth daily.     escitalopram (LEXAPRO) 5 MG tablet Take 5 mg by mouth daily.   3   hydrochlorothiazide (HYDRODIURIL) 25 MG tablet Take 25 mg by mouth daily.     Multiple Vitamin (MULTIVITAMIN WITH MINERALS) TABS tablet Take 1 tablet by mouth daily.     oxyCODONE  (OXY IR/ROXICODONE ) 5 MG immediate release tablet Take 1 tablet (5 mg total) by mouth every 6 (six) hours as needed for severe pain (pain score 7-10). 15 tablet 0   No current facility-administered medications for this visit.    PHYSICAL EXAMINATION: ECOG PERFORMANCE STATUS: 1 - Symptomatic but completely ambulatory  Vitals:   10/19/23 1147  BP: 117/69  Pulse: 79  Resp: 17  Temp: 98.1 F (36.7 C)  SpO2: 99%   Filed Weights   10/19/23 1147  Weight: 228 lb 6.4 oz (103.6 kg)      LABORATORY DATA:  I have reviewed the data as listed    Latest Ref Rng & Units 09/06/2023   11:56 AM 05/11/2023    9:18 AM 02/16/2023   11:24 AM  CMP  Glucose 70 - 99 mg/dL 409  811  914   BUN 8 - 23 mg/dL 18  25  23    Creatinine 0.44 - 1.00 mg/dL 7.82  9.56  2.13   Sodium 135 - 145 mmol/L 142  143  142   Potassium 3.5 - 5.1 mmol/L 3.8  3.8  4.0   Chloride 98 -  111 mmol/L 108  105  105   CO2 22 - 32 mmol/L 27  22  24    Calcium 8.9 - 10.3 mg/dL 9.7  9.8  21.3   Total Protein 6.5 - 8.1 g/dL 7.7  7.4    Total Bilirubin 0.0 - 1.2 mg/dL 0.4  0.4    Alkaline Phos 38 - 126 U/L 55  63    AST 15 - 41 U/L 20  20    ALT 0 - 44 U/L 17  19      Lab Results  Component Value Date   WBC 7.4 09/06/2023   HGB 12.0 09/06/2023   HCT 35.3 (L) 09/06/2023   MCV 95.7 09/06/2023   PLT 277 09/06/2023   NEUTROABS 3.6 09/06/2023    ASSESSMENT & PLAN:  Malignant neoplasm of upper-inner quadrant of right breast in female, estrogen receptor positive (HCC) 09/26/2023:Right lumpectomy: Grade 2 IDC 2.5 cm with intermediate grade DCIS, margins negative, negative for  lymphovascular invasion, ER 100%, PR 90%, Ki67 25%, HER2 negative by [facility]  Pathology counseling: I discussed the final pathology report of the patient provided  a copy of this report. I discussed the margins as well as lymph node surgeries. We also discussed the final staging along with previously performed ER/PR and HER-2/neu testing.  Treatment plan: Adjuvant radiation therapy followed by Adjuvant antiestrogen therapy  We did do not to do Oncotype DX based upon her age and wishes Return to clinic after radiation is completed ------------------------------------- Assessment and Plan Assessment & Plan Malignant neoplasm of breast Previously treated with surgery, 2.5 cm tumor resected with negative margins. No lymphovascular invasion. Currently cancer-free, stage 1A.  Radiation therapy for breast cancer Planned as adjuvant treatment to prevent recurrence. Fatigue possible, resolves quickly. Encouraged time off work if needed. - Proceed with radiation therapy consultation next Tuesday. - Discuss potential side effects, including fatigue. - Encourage taking time off work if needed during therapy.  Anastrozole  therapy for breast cancer Planned post-radiation therapy, switching from tamoxifen . Anastrozole  reduces recurrence risk in postmenopausal women. - Initiate anastrozole  therapy post-radiation therapy. - Provide information about anastrozole  for education.      No orders of the defined types were placed in this encounter.  The patient has a good understanding of the overall plan. she agrees with it. she will call with any problems that may develop before the next visit here. Total time spent: 30 mins including face to face time and time spent for planning, charting and co-ordination of care   Viinay K Gavin Faivre, MD 10/19/23

## 2023-10-20 ENCOUNTER — Encounter: Payer: Self-pay | Admitting: Radiation Oncology

## 2023-10-20 NOTE — Progress Notes (Signed)
 Nursing interview for a diagnosis of Malignant neoplasm of upper-inner quadrant of right breast in female, estrogen receptor positive (HCC).  Patient identity verified x2.   Intent: Curative Location: RT breast   Patient states issues as follows...    -Pain: Denies -ROM: Denies -Lymphedema: Denies -Skin: Healing well -Cardiac issue: Denies -Lung issue: Denies -Appetite: Good   All pertinent body systems reviewed w/ patient. Patient denies any other related issues at this time.   Meaningful use complete.   Vitals- BP 125/82   Pulse 74   Temp 98 F (36.7 C) (Oral)   Resp 20   Ht 5' 9.5" (1.765 m)   Wt 227 lb (103 kg)   SpO2 100%   BMI 33.04 kg/m   SAFETY ISSUES: Prior radiation? None Pacemaker/ICD? Denies Possible current pregnancy? NO- Hysterectomy   Is the patient on methotrexate? Denies   This concludes the interaction.    Avery Bodo, LPN   Additional Complaints / other details:   FAMILY HISTORY: family history includes Cancer in her mother and sister.    Medical oncologist, treatment if any: Dr. Lee Public Treatment plan: Adjuvant radiation therapy followed by Adjuvant antiestrogen therapy  Anastrozole  therapy for breast cancer. Planned post-radiation therapy, switching from tamoxifen .   Screening Mammogram on 08/11/2023: FINDINGS: In the right breast, a possible mass warrants further evaluation.  Diagnostic Mammogram on 08/28/2023:  IMPRESSION:  1. There is a suspicious 0.9 cm mass in the right breast at 2 o'clock. 2.  No evidence of right axillary lymphadenopathy.  Surgeon / surgical plan, if any: Dr. Sim Dryer, MD RT Lumpectomy w/ radioactive seeds 09/26/2023   Previous: LT mastectomy 01/25/2012 Dr. Darcella Earnest (No radiation or chemo)  Histology per Pathology Report: Invasive ductal carcinoma    Pathology Results 09/26/2023: FINAL MICROSCOPIC DIAGNOSIS:  A. BREAST, RIGHT, LUMPECTOMY:  - Invasive ductal carcinoma, 2.5 cm, grade 2   - Ductal carcinoma in situ, intermediate grade  - Resection margins are negative for carcinoma; closest is the medial  margin at 0.5 cm  - Negative for lymphovascular or perineural invasion  - Biopsy site changes  - See oncology table   B. BREAST, RIGHT MEDIAL MARGIN, EXCISION:  - Benign fibroadipose tissue, negative for carcinoma

## 2023-10-24 ENCOUNTER — Ambulatory Visit
Admission: RE | Admit: 2023-10-24 | Discharge: 2023-10-24 | Disposition: A | Discharge status: Home / Self Care | Source: Ambulatory Visit | Attending: Radiation Oncology | Admitting: Radiation Oncology

## 2023-10-24 ENCOUNTER — Encounter: Payer: Self-pay | Admitting: Radiation Oncology

## 2023-10-24 VITALS — BP 125/82 | HR 74 | Temp 98.0°F | Resp 20 | Ht 69.5 in | Wt 227.0 lb

## 2023-10-24 DIAGNOSIS — Z510 Encounter for antineoplastic radiation therapy: Secondary | ICD-10-CM | POA: Insufficient documentation

## 2023-10-24 DIAGNOSIS — C50211 Malignant neoplasm of upper-inner quadrant of right female breast: Secondary | ICD-10-CM

## 2023-10-24 DIAGNOSIS — C50212 Malignant neoplasm of upper-inner quadrant of left female breast: Secondary | ICD-10-CM | POA: Insufficient documentation

## 2023-10-24 DIAGNOSIS — Z170 Estrogen receptor positive status [ER+]: Secondary | ICD-10-CM | POA: Insufficient documentation

## 2023-10-24 NOTE — Progress Notes (Signed)
 Radiation Oncology         [phone]) [phone] ________________________________  Name: Catherine Atkins        MRN: [id number]  Date of Service: 10/24/2023 DOB: Oct 16, 1950  YN:[id number], Joanette Moynahan, MD  Cameron Cea, MD     REFERRING PHYSICIAN: Cameron Cea, MD   DIAGNOSIS: The encounter diagnosis was Malignant neoplasm of upper-inner quadrant of right breast in female, estrogen receptor positive (HCC).   HISTORY OF PRESENT ILLNESS: Catherine Atkins is a 73 y.o. female with a history of left sided breast cancer in 2013 treated with left mastectomy and tamoxifen  for 5 years.  More recently, she was seen in the multidisciplinary breast clinic for a new diagnosis of  right breast cancer.  Her cancer was found by screening mammography, and she proceeded with diagnostic mammogram and ultrasound on 08/28/2023 that showed a 9 mm mass in the 2:00 position in the right breast without abnormalities in the right axilla. A biopsy on 08/31/2023 revealed grade 3 invasive ductal carcinoma that was ER and PR positive and HER2 negative with a Ki-67 25%.   Since her last visit, the patient underwent a right lumpectomy with Dr. Afton Horse on 09/26/2023.  Final pathology showed a 2.5 cm grade 2 invasive ductal carcinoma with associated intermediate grade DCIS.  Her resection margins were negative for invasive and in situ disease, and additional right medial margin showed fibroadipose tissue negative for carcinoma.  She met with Dr. Gudena they decided to forgo Oncotype Dx testing.  The patient is seen to discuss adjuvant radiotherapy.  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       PAST SURGICAL HISTORY: Past Surgical History:  Procedure Laterality Date   ABDOMINAL HYSTERECTOMY  1989   complete   ABSCESS DRAINAGE     on neck   APPENDECTOMY     BREAST BIOPSY Right 08/31/2023   US  RT BREAST BX W LOC DEV 1ST LESION IMG BX SPEC US  GUIDE 08/31/2023 [facility]MO[facility]AP[facility]   BREAST BIOPSY  09/21/2023   MM RT RADIOACTIVE SEED LOC MAMMO GUIDE 09/21/2023 [facility] MAMMOGRAPHY   BREAST LUMPECTOMY WITH RADIOACTIVE SEED LOCALIZATION Right 09/26/2023   Procedure: BREAST LUMPECTOMY WITH RADIOACTIVE SEED LOCALIZATION;  Surgeon: Sim Dryer, MD;  Location: [facility];  Service: General;  Laterality: Right;  right breast seed lumpectomy   COLONOSCOPY  2004   Dr. Riley Cheadle: diverticulosis, diminutive rectosigmoid polyps removed/cold biopsy.   COLONOSCOPY WITH PROPOFOL  N/A 07/09/2018   Procedure: COLONOSCOPY WITH PROPOFOL ;  Surgeon: Suzette Espy, MD;  [facility];  Service: Endoscopy;  Laterality: N/A;  2:00pm   DEBRIDEMENT AND CLOSURE WOUND Left 11/06/2015   Procedure: EXCISION OF LEFT CHEST CHRONIC SEROMA ;  Surgeon: Alger Infield, MD;  Location: [facility];  Service: Plastics;  Laterality: Left;   MASTECTOMY Left 01/25/2012   no radiation no chemo   POLYPECTOMY  07/09/2018   Procedure: POLYPECTOMY;  Surgeon: Garnette Ka  M, MD;  Location: [facility];  Service: Endoscopy;;  cecal polyps x3      FAMILY HISTORY:  Family History  Problem Relation Age of Onset   Breast cancer Mother        dx late 72s   Leukemia Father        dx 60s   Breast cancer Sister        dx 32s   Heart attack Brother    Prostate cancer Brother    Prostate cancer Brother    Prostate cancer Maternal Uncle        x2 maternal uncles   Colon cancer Neg Hx      SOCIAL HISTORY:  reports that she has never smoked. She has never used smokeless  tobacco. She reports that she does not drink alcohol and does not use drugs.  The patient is widowed and lives in [place].  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 She enjoys being active and working.   ALLERGIES: Codeine   MEDICATIONS:  Current Outpatient Medications  Medication Sig Dispense Refill   acetaminophen  (TYLENOL ) 500 MG tablet Take 1,000 mg by mouth every 6 (six) hours as needed for moderate pain.     amLODipine-benazepril (LOTREL) 10-40 MG capsule Take 1 capsule by mouth daily.     atenolol  (TENORMIN ) 25 MG tablet Take 50 mg by mouth daily.      buPROPion  (WELLBUTRIN  XL) 150 MG 24 hr tablet Take 450 mg by mouth daily.      cholecalciferol  (VITAMIN D3) 25 MCG (1000 UNIT) tablet Take 1,000 Units by mouth daily.     cyanocobalamin (VITAMIN B12) 100 MCG tablet Take 100 mcg by mouth daily.     escitalopram (LEXAPRO) 5 MG tablet Take 5 mg by mouth daily.   3   hydrochlorothiazide (HYDRODIURIL) 25 MG tablet Take 25 mg by mouth daily.     Multiple Vitamin (MULTIVITAMIN WITH MINERALS) TABS tablet Take 1 tablet by mouth daily.     oxyCODONE  (OXY IR/ROXICODONE ) 5 MG immediate release tablet Take 1 tablet (5 mg total) by mouth every 6 (six) hours as needed for severe pain (pain score 7-10). 15 tablet 0   No current facility-administered medications for this encounter.     REVIEW OF SYSTEMS: On review of systems, the patient reports that she is doing well since surgery. She has some sharp palpable suture at the edges of her incision site. No other complaints are verbalized.      PHYSICAL EXAM:  Wt Readings from Last 3 Encounters:  10/20/23 227 lb (103 kg)  10/19/23 228 lb 6.4 oz (103.6 kg)  09/26/23 229 lb 0.9 oz (103.9 kg)   Temp Readings from Last 3 Encounters:  10/20/23 98 F (36.7 C) (Oral)  10/19/23 98.1 F (36.7 C) (Temporal)  09/26/23 98 F (36.7 C)   BP Readings from Last 3 Encounters:  10/20/23 125/82  10/19/23 117/69  09/26/23 112/64    Pulse Readings from Last 3 Encounters:  10/20/23 74  10/19/23 79  09/26/23 (!) 54    In general this is a well appearing female in no acute distress. She's alert and oriented x4 and appropriate throughout the examination. Cardiopulmonary assessment is negative for acute distress and she exhibits normal effort. Her left breast is surgically absent, and her right breast reveals a well healed surgical incision site without erythema, separation, or drainage. There are firm suture tags noted about 1 mm from the skin on either side.  ECOG = 0  0 - Asymptomatic (Fully active, able to carry on all predisease activities without restriction)  1 - Symptomatic but completely ambulatory (Restricted in physically strenuous activity but ambulatory and able to carry out work of a light or sedentary nature. For example, light housework, office work)  2 - Symptomatic, <50% in bed during the day (Ambulatory and capable of all self care but unable to carry out any work activities. Up and about more than 50% of waking hours)  3 - Symptomatic, >50% in bed, but not bedbound (Capable of only limited self-care, confined to bed or chair 50% or more of waking hours)  4 - Bedbound (Completely disabled. Cannot carry on any self-care. Totally confined to bed or chair)  5 - Death   Aurea Blossom MM, Creech RH, Tormey DC, et al. [phone]). "Toxicity and response criteria of the [facility]". Am. Hillard [employer]. Oncol. 5 (6): 649-55    LABORATORY DATA:  Lab Results  Component Value Date   WBC 7.4 09/06/2023   HGB 12.0 09/06/2023   HCT 35.3 (L) 09/06/2023   MCV 95.7 09/06/2023   PLT 277 09/06/2023   Lab Results  Component Value Date   NA 142 09/06/2023   K 3.8 09/06/2023   CL 108 09/06/2023   CO2 27 09/06/2023   Lab Results  Component Value Date   ALT 17 09/06/2023   AST 20 09/06/2023   GGT 10 07/23/2021   ALKPHOS 55 09/06/2023   BILITOT 0.4 09/06/2023      RADIOGRAPHY: MM Breast  Surgical Specimen Result Date: 09/26/2023 CLINICAL DATA:  73 year old with biopsy-proven grade 3 invasive ductal carcinoma involving the RIGHT breast. Radioactive seed localization was performed on 09/21/2023 in anticipation of today's lumpectomy. EXAM: SPECIMEN RADIOGRAPH OF THE RIGHT BREAST COMPARISON:  Previous exam(s). FINDINGS: Status post excision of the RIGHT breast. The radioactive seed and the ribbon shaped tissue marking clip are present in the non-compressed specimen. The seed is intact. This was discussed by telephone with the operating room nurse at the time of interpretation on 09/26/2023. IMPRESSION: Specimen radiograph of the RIGHT breast. Electronically Signed   By: Rinda Cheers M.D.   On: 09/26/2023 11:35       IMPRESSION/PLAN: 1.  Stage IA, pT2N0M0, grade 2, ER/PR positive invasive ductal carcinoma of the right breast. Dr. Jeryl Moris has reviewed her final pathology results.  She has done well since surgery.  Dr. Lee Public did not order Oncotype DX based on the patient's wishes to forego systemic treatment with chemotherapy.  Given her T staging however Dr. Jeryl Moris recommends adjuvant external radiotherapy to the breast  to reduce risks of local recurrence. Dr. Lee Public anticipates adjuvant antiestrogen therapy to follow. We discussed the risks, benefits, short, and long term effects of radiotherapy, as well as the curative intent, and the patient is interested in proceeding. Dr. Jeryl Moris discusses the delivery and logistics of radiotherapy and anticipates a course of 4 weeks of radiotherapy to the right breast. Written consent is obtained and placed in the chart, a copy was provided to the patient. She will simulate today. I encouraged her to try aquaphor or antibiotic ointment on the suture site to soften the suture as it will dissolve, but she is aware we will give her lotion to use during radiation that we prefer once we begin therapy.   In a visit lasting 45 minutes, greater than 50% of the  time was spent face to face discussing the patient's condition, in preparation for the discussion,  and coordinating the patient's care.    Shelvia Dick, [facility]    **Disclaimer: This note was dictated with voice recognition software. Similar sounding words can inadvertently be transcribed and this note may contain transcription errors which may not have been corrected upon publication of note.**

## 2023-10-25 ENCOUNTER — Telehealth: Payer: Self-pay | Admitting: *Deleted

## 2023-10-25 NOTE — Telephone Encounter (Signed)
 10/24/2023 at 10:14 am Received Secure Chat: "This pt. in RM 4 Rad/Onc clinic, that has some questions/paperwork for either of you. Can you help her? requesting a Forms staff member   Advised to send Lashai Grosch to first floor room 1-059 for assistance per forms protocol however Rad/Onc "I found the orange folder.... Thank you" Asked further questions.  Discovered no [employer], [employer] FMLA/Disability Cover Sheet present and employee section of form is incomplete.    Currently, through DocuSign sent form, [facility] HIPAA Authorization, and [facility] Disability/FMLA Cover Sheet and the [facility] HIPAA Authorization using Docu[employer].  Unable to Connect with Jodell Weitman [phone]) to notify of above information.  Message left requesting return call to this forms nurse.  FMLA paperwork in queue to process per forms protocol yet awaiting further information.

## 2023-10-27 DIAGNOSIS — Z510 Encounter for antineoplastic radiation therapy: Secondary | ICD-10-CM | POA: Diagnosis not present

## 2023-10-31 ENCOUNTER — Ambulatory Visit (INDEPENDENT_AMBULATORY_CARE_PROVIDER_SITE_OTHER): Payer: No Typology Code available for payment source | Admitting: Otolaryngology

## 2023-10-31 ENCOUNTER — Encounter: Payer: Self-pay | Admitting: *Deleted

## 2023-10-31 VITALS — BP 136/78 | HR 83

## 2023-10-31 DIAGNOSIS — Z170 Estrogen receptor positive status [ER+]: Secondary | ICD-10-CM

## 2023-10-31 DIAGNOSIS — H903 Sensorineural hearing loss, bilateral: Secondary | ICD-10-CM | POA: Diagnosis not present

## 2023-10-31 DIAGNOSIS — H9311 Tinnitus, right ear: Secondary | ICD-10-CM

## 2023-10-31 IMAGING — MG MM DIGITAL SCREENING UNILAT*R* W/ TOMO W/ CAD
4 series · 4 of 12 positions shown · non-contrast
Comparison: Previous exam(s).

CLINICAL DATA: Screening.

EXAM:
DIGITAL SCREENING UNILATERAL RIGHT MAMMOGRAM WITH CAD AND
TOMOSYNTHESIS
TECHNIQUE: Right screening digital craniocaudal and mediolateral oblique
mammograms were obtained. Right screening digital breast
tomosynthesis was performed. The images were evaluated with
computer-aided detection.

[R MLO synth-2D]
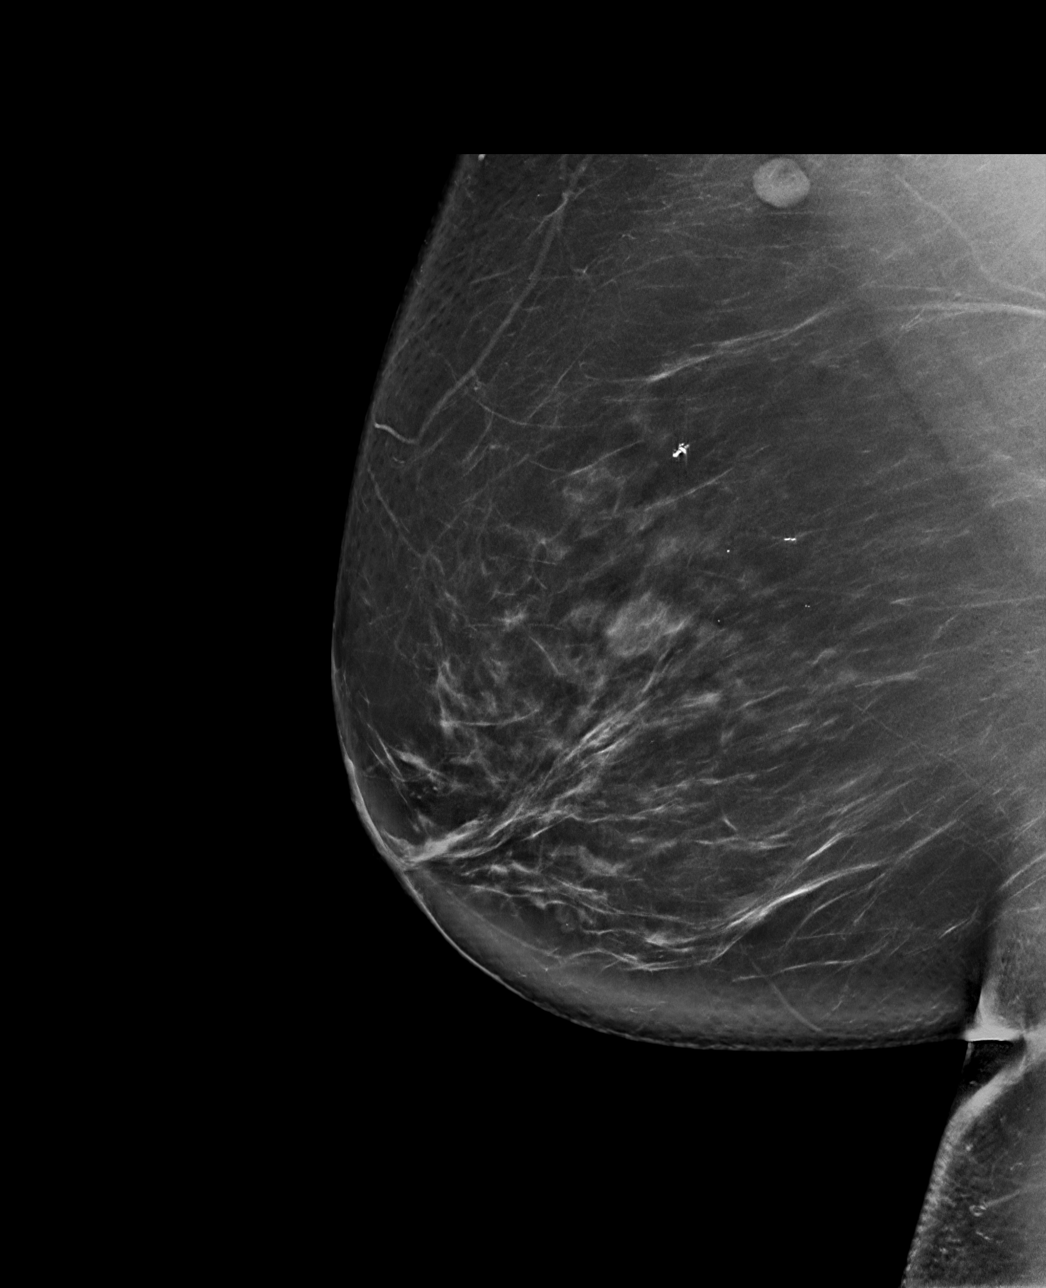

[R CC synth-2D]
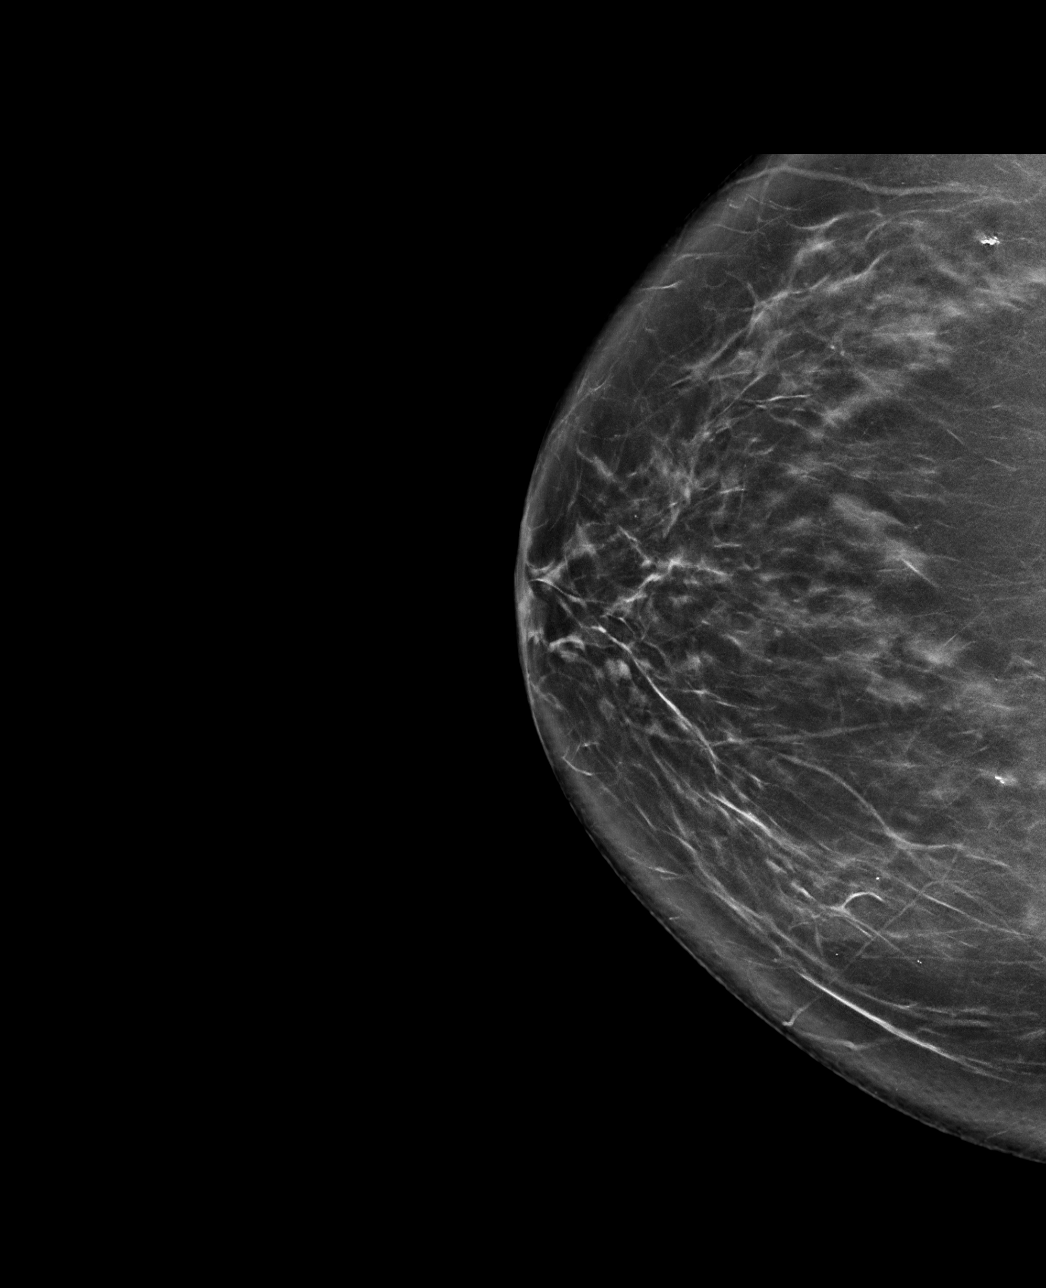

[R MLO tomo · tomo slice 51/100.0]
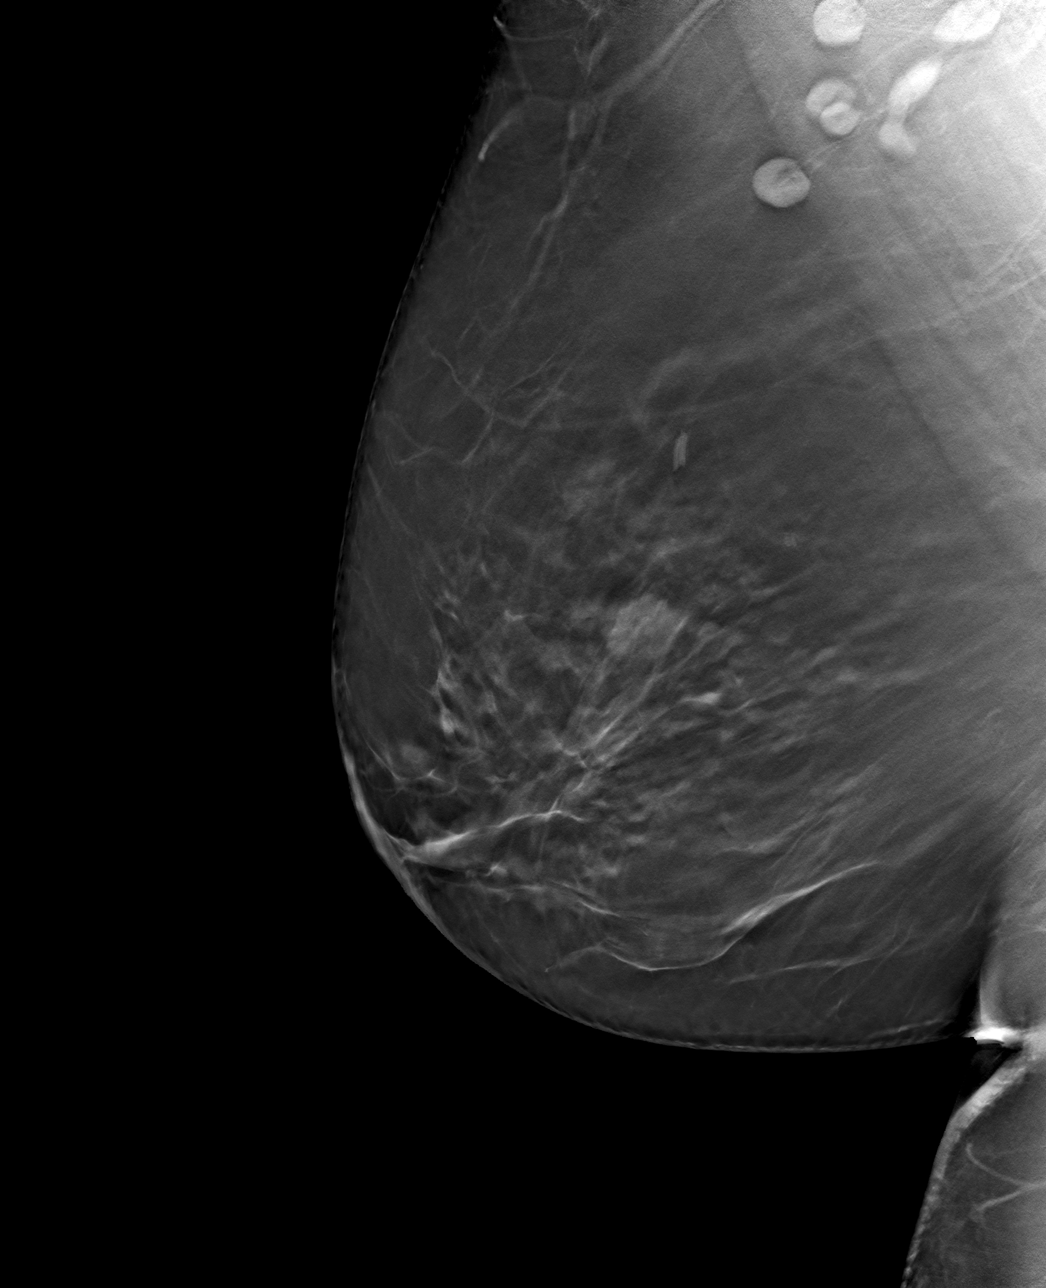

[R CC tomo · tomo slice 43/86.0]
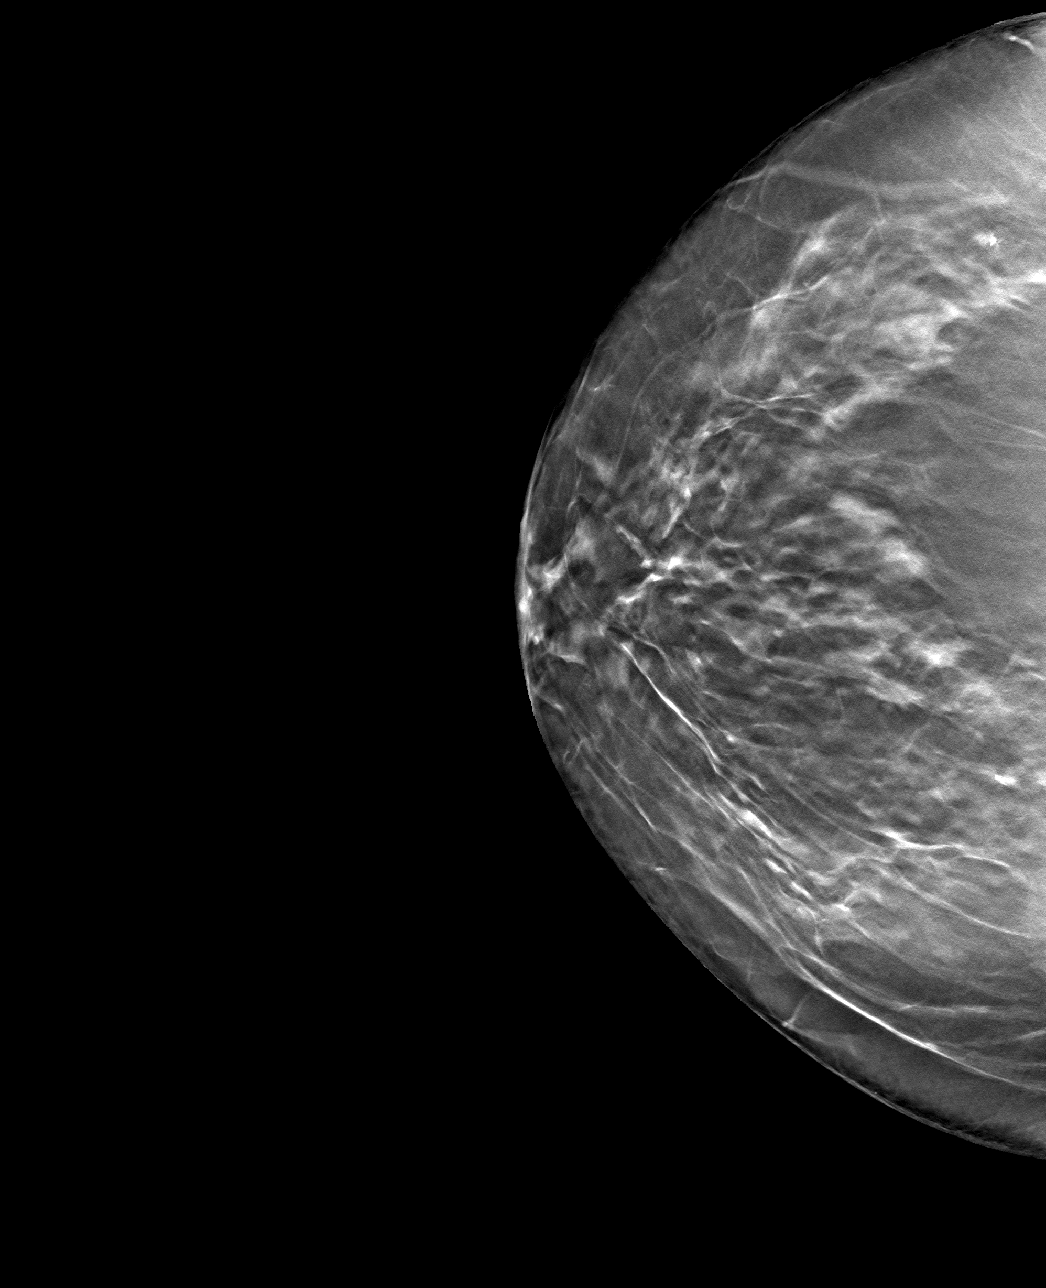

[4 of 12 positions shown; findings below may reference images not displayed]

ACR Breast Density Category b: There are scattered areas of
fibroglandular density.
FINDINGS: There are no findings suspicious for malignancy.
IMPRESSION: No mammographic evidence of malignancy. A result letter of this
screening mammogram will be mailed directly to the patient.

RECOMMENDATION:
Screening mammogram in one year. (Code:C3-I-RW2)

BI-RADS CATEGORY  1: Negative.

## 2023-10-31 NOTE — Progress Notes (Signed)
 Patient ID: Catherine Atkins, female   DOB: 07/14/1950, 73 y.o.   MRN: [id number]  Follow-up: Bilateral sensorineural hearing loss, right ear tinnitus  HPI: The patient is a 73 year old female who returns today for her follow-up evaluation.  At her last visit in November 2024, she was noted to have bilateral high-frequency sensorineural hearing loss, slightly worse on the right side.  She was also noted to have subjective right ear tinnitus.  The small possibility of a retrocochlear lesion causing her asymmetric hearing loss and tinnitus was discussed.  The patient elected to proceed with conservative observation.  The patient returns today reporting no significant change in her symptoms.  She continues to have a nonpulsatile constant ringing right ear tinnitus.  She denies any change in her hearing.  She has never worn hearing aids.  Exam: General: Communicates without difficulty, well nourished, no acute distress. Head: Normocephalic, no evidence injury, no tenderness, facial buttresses intact without stepoff. Face/sinus: No tenderness to palpation and percussion. Facial movement is normal and symmetric. Eyes: PERRL, EOMI. No scleral icterus, conjunctivae clear. Neuro: CN II exam reveals vision grossly intact.  No nystagmus at any point of gaze. Ears: Auricles well formed without lesions.  Ear canals are intact without mass or lesion.  No erythema or edema is appreciated.  The TMs are intact without fluid. Nose: External evaluation reveals normal support and skin without lesions.  Dorsum is intact.  Anterior rhinoscopy reveals normal mucosa over anterior aspect of inferior turbinates and intact septum.  No purulence noted. Oral:  Oral cavity and oropharynx are intact, symmetric, without erythema or edema.  Mucosa is moist without lesions. Neck: Full range of motion without pain.  There is no significant lymphadenopathy.  No masses palpable.  Thyroid bed within normal limits to palpation.  Parotid glands and  submandibular glands equal bilaterally without mass.  Trachea is midline. Neuro:  CN 2-12 grossly intact.   Assessment: 1.  Subjectively stable bilateral high-frequency sensorineural hearing loss, slightly worse on the right side. 2.  Subjective right ear tinnitus, likely a result of her hearing loss. 3.  Her ear canals, tympanic membranes, and middle ear spaces are normal.  Plan: 1.  The physical exam findings are reviewed with the patient. 2.  The strategies to cope with tinnitus are again discussed. 3.  The small possibility of a retrocochlear lesion causing the asymmetric hearing loss is discussed.  The options of conservative observation versus MRI scan are reviewed.  The patient would like to continue with conservative observation.  4.  The patient will return for reevaluation in 6 months, with a repeat hearing test.

## 2023-11-01 ENCOUNTER — Ambulatory Visit
Admission: RE | Admit: 2023-11-01 | Discharge: 2023-11-01 | Disposition: A | Discharge status: Home / Self Care | Source: Ambulatory Visit | Attending: Radiation Oncology | Admitting: Radiation Oncology

## 2023-11-01 ENCOUNTER — Other Ambulatory Visit: Payer: Self-pay

## 2023-11-01 DIAGNOSIS — Z510 Encounter for antineoplastic radiation therapy: Secondary | ICD-10-CM | POA: Diagnosis not present

## 2023-11-01 LAB — RAD ONC ARIA SESSION SUMMARY
Course Elapsed Days: 0
Plan Fractions Treated to Date: 1
Plan Prescribed Dose Per Fraction: 2.66 Gy
Plan Total Fractions Prescribed: 16
Plan Total Prescribed Dose: 42.56 Gy
Reference Point Dosage Given to Date: 2.66 Gy
Reference Point Session Dosage Given: 2.66 Gy
Session Number: 1

## 2023-11-02 ENCOUNTER — Other Ambulatory Visit: Payer: Self-pay

## 2023-11-02 ENCOUNTER — Ambulatory Visit
Admission: RE | Admit: 2023-11-02 | Discharge: 2023-11-02 | Disposition: A | Discharge status: Home / Self Care | Source: Ambulatory Visit | Attending: Radiation Oncology | Admitting: Radiation Oncology

## 2023-11-02 DIAGNOSIS — Z510 Encounter for antineoplastic radiation therapy: Secondary | ICD-10-CM | POA: Diagnosis not present

## 2023-11-02 LAB — RAD ONC ARIA SESSION SUMMARY
Course Elapsed Days: 1
Plan Fractions Treated to Date: 2
Plan Prescribed Dose Per Fraction: 2.66 Gy
Plan Total Fractions Prescribed: 16
Plan Total Prescribed Dose: 42.56 Gy
Reference Point Dosage Given to Date: 5.32 Gy
Reference Point Session Dosage Given: 2.66 Gy
Session Number: 2

## 2023-11-03 ENCOUNTER — Ambulatory Visit
Admission: RE | Admit: 2023-11-03 | Discharge: 2023-11-03 | Disposition: A | Discharge status: Home / Self Care | Source: Ambulatory Visit | Attending: Radiation Oncology | Admitting: Radiation Oncology

## 2023-11-03 ENCOUNTER — Other Ambulatory Visit: Payer: Self-pay

## 2023-11-03 ENCOUNTER — Telehealth: Payer: Self-pay | Admitting: *Deleted

## 2023-11-03 DIAGNOSIS — Z510 Encounter for antineoplastic radiation therapy: Secondary | ICD-10-CM | POA: Diagnosis not present

## 2023-11-03 DIAGNOSIS — Z170 Estrogen receptor positive status [ER+]: Secondary | ICD-10-CM

## 2023-11-03 LAB — RAD ONC ARIA SESSION SUMMARY
Course Elapsed Days: 2
Plan Fractions Treated to Date: 3
Plan Prescribed Dose Per Fraction: 2.66 Gy
Plan Total Fractions Prescribed: 16
Plan Total Prescribed Dose: 42.56 Gy
Reference Point Dosage Given to Date: 7.98 Gy
Reference Point Session Dosage Given: 2.66 Gy
Session Number: 3

## 2023-11-03 MED ORDER — RADIAPLEXRX EX GEL: Freq: Once | CUTANEOUS | Status: AC

## 2023-11-03 MED ORDER — ALRA NON-METALLIC DEODORANT (RAD-ONC)
1.0000 | Freq: Once | TOPICAL | Status: AC
  Administered 2023-11-03: 1 via TOPICAL

## 2023-11-03 NOTE — Telephone Encounter (Signed)
 The [employer] no.#: [id number]: and [employer] Disability claim no.#: [id number] paperwork completed by this nurse at this time.  Folder placed in designated mail bin for collaborative pick up for provider review, signature, and return to forms nurse to return to Scientific laboratory technician.

## 2023-11-06 ENCOUNTER — Ambulatory Visit

## 2023-11-07 ENCOUNTER — Other Ambulatory Visit: Payer: Self-pay

## 2023-11-07 ENCOUNTER — Ambulatory Visit
Admission: RE | Admit: 2023-11-07 | Discharge: 2023-11-07 | Disposition: A | Discharge status: Home / Self Care | Source: Ambulatory Visit | Attending: Radiation Oncology | Admitting: Radiation Oncology

## 2023-11-07 DIAGNOSIS — Z510 Encounter for antineoplastic radiation therapy: Secondary | ICD-10-CM | POA: Insufficient documentation

## 2023-11-07 DIAGNOSIS — C50212 Malignant neoplasm of upper-inner quadrant of left female breast: Secondary | ICD-10-CM | POA: Diagnosis present

## 2023-11-07 DIAGNOSIS — Z170 Estrogen receptor positive status [ER+]: Secondary | ICD-10-CM | POA: Insufficient documentation

## 2023-11-07 LAB — RAD ONC ARIA SESSION SUMMARY
Course Elapsed Days: 6
Plan Fractions Treated to Date: 4
Plan Prescribed Dose Per Fraction: 2.66 Gy
Plan Total Fractions Prescribed: 16
Plan Total Prescribed Dose: 42.56 Gy
Reference Point Dosage Given to Date: 10.64 Gy
Reference Point Session Dosage Given: 2.66 Gy
Session Number: 4

## 2023-11-07 NOTE — Telephone Encounter (Addendum)
 Today this nurse returned to office and received leave and disability forms signed by provider.  Successfully returned to [employer] via fax [phone]).  No records requested for (SW) H.I.M.  Copy to bin designated for items to be scanned.  Copies in [facility] mail pick-up bin to be mailed to pt. Address on file.  [address] [place] [address] Process completed with no further instructions received, actions performed or required by this nurse.

## 2023-11-08 ENCOUNTER — Ambulatory Visit
Admission: RE | Admit: 2023-11-08 | Discharge: 2023-11-08 | Disposition: A | Discharge status: Home / Self Care | Source: Ambulatory Visit | Attending: Radiation Oncology | Admitting: Radiation Oncology

## 2023-11-08 ENCOUNTER — Other Ambulatory Visit: Payer: Self-pay

## 2023-11-08 DIAGNOSIS — Z510 Encounter for antineoplastic radiation therapy: Secondary | ICD-10-CM | POA: Diagnosis not present

## 2023-11-08 LAB — RAD ONC ARIA SESSION SUMMARY
Course Elapsed Days: 7
Plan Fractions Treated to Date: 5
Plan Prescribed Dose Per Fraction: 2.66 Gy
Plan Total Fractions Prescribed: 16
Plan Total Prescribed Dose: 42.56 Gy
Reference Point Dosage Given to Date: 13.3 Gy
Reference Point Session Dosage Given: 2.66 Gy
Session Number: 5

## 2023-11-09 ENCOUNTER — Ambulatory Visit
Admission: RE | Admit: 2023-11-09 | Discharge: 2023-11-09 | Disposition: A | Discharge status: Home / Self Care | Source: Ambulatory Visit | Attending: Radiation Oncology | Admitting: Radiation Oncology

## 2023-11-09 ENCOUNTER — Other Ambulatory Visit: Payer: Self-pay

## 2023-11-09 DIAGNOSIS — Z510 Encounter for antineoplastic radiation therapy: Secondary | ICD-10-CM | POA: Diagnosis not present

## 2023-11-09 LAB — RAD ONC ARIA SESSION SUMMARY
Course Elapsed Days: 8
Plan Fractions Treated to Date: 6
Plan Prescribed Dose Per Fraction: 2.66 Gy
Plan Total Fractions Prescribed: 16
Plan Total Prescribed Dose: 42.56 Gy
Reference Point Dosage Given to Date: 15.96 Gy
Reference Point Session Dosage Given: 2.66 Gy
Session Number: 6

## 2023-11-10 ENCOUNTER — Ambulatory Visit
Admission: RE | Admit: 2023-11-10 | Discharge: 2023-11-10 | Disposition: A | Discharge status: Home / Self Care | Source: Ambulatory Visit | Attending: Radiation Oncology | Admitting: Radiation Oncology

## 2023-11-10 ENCOUNTER — Other Ambulatory Visit: Payer: Self-pay

## 2023-11-10 DIAGNOSIS — Z510 Encounter for antineoplastic radiation therapy: Secondary | ICD-10-CM | POA: Diagnosis not present

## 2023-11-10 LAB — RAD ONC ARIA SESSION SUMMARY
Course Elapsed Days: 9
Plan Fractions Treated to Date: 7
Plan Prescribed Dose Per Fraction: 2.66 Gy
Plan Total Fractions Prescribed: 16
Plan Total Prescribed Dose: 42.56 Gy
Reference Point Dosage Given to Date: 18.62 Gy
Reference Point Session Dosage Given: 2.66 Gy
Session Number: 7

## 2023-11-13 ENCOUNTER — Ambulatory Visit
Admission: RE | Admit: 2023-11-13 | Discharge: 2023-11-13 | Disposition: A | Discharge status: Home / Self Care | Source: Ambulatory Visit | Attending: Radiation Oncology | Admitting: Radiation Oncology

## 2023-11-13 ENCOUNTER — Other Ambulatory Visit: Payer: Self-pay

## 2023-11-13 DIAGNOSIS — Z510 Encounter for antineoplastic radiation therapy: Secondary | ICD-10-CM | POA: Diagnosis not present

## 2023-11-13 LAB — RAD ONC ARIA SESSION SUMMARY
Course Elapsed Days: 12
Plan Fractions Treated to Date: 8
Plan Prescribed Dose Per Fraction: 2.66 Gy
Plan Total Fractions Prescribed: 16
Plan Total Prescribed Dose: 42.56 Gy
Reference Point Dosage Given to Date: 21.28 Gy
Reference Point Session Dosage Given: 2.66 Gy
Session Number: 8

## 2023-11-14 ENCOUNTER — Ambulatory Visit
Admission: RE | Admit: 2023-11-14 | Discharge: 2023-11-14 | Disposition: A | Discharge status: Home / Self Care | Source: Ambulatory Visit | Attending: Radiation Oncology | Admitting: Radiation Oncology

## 2023-11-14 ENCOUNTER — Other Ambulatory Visit: Payer: Self-pay

## 2023-11-14 DIAGNOSIS — Z510 Encounter for antineoplastic radiation therapy: Secondary | ICD-10-CM | POA: Diagnosis not present

## 2023-11-14 LAB — RAD ONC ARIA SESSION SUMMARY
Course Elapsed Days: 13
Plan Fractions Treated to Date: 9
Plan Prescribed Dose Per Fraction: 2.66 Gy
Plan Total Fractions Prescribed: 16
Plan Total Prescribed Dose: 42.56 Gy
Reference Point Dosage Given to Date: 23.94 Gy
Reference Point Session Dosage Given: 2.66 Gy
Session Number: 9

## 2023-11-15 ENCOUNTER — Ambulatory Visit
Admission: RE | Admit: 2023-11-15 | Discharge: 2023-11-15 | Disposition: A | Discharge status: Home / Self Care | Source: Ambulatory Visit | Attending: Radiation Oncology | Admitting: Radiation Oncology

## 2023-11-15 ENCOUNTER — Other Ambulatory Visit: Payer: Self-pay

## 2023-11-15 DIAGNOSIS — Z510 Encounter for antineoplastic radiation therapy: Secondary | ICD-10-CM | POA: Diagnosis not present

## 2023-11-15 LAB — RAD ONC ARIA SESSION SUMMARY
Course Elapsed Days: 14
Plan Fractions Treated to Date: 10
Plan Prescribed Dose Per Fraction: 2.66 Gy
Plan Total Fractions Prescribed: 16
Plan Total Prescribed Dose: 42.56 Gy
Reference Point Dosage Given to Date: 26.6 Gy
Reference Point Session Dosage Given: 2.66 Gy
Session Number: 10

## 2023-11-16 ENCOUNTER — Ambulatory Visit
Admission: RE | Admit: 2023-11-16 | Discharge: 2023-11-16 | Disposition: A | Discharge status: Home / Self Care | Source: Ambulatory Visit | Attending: Radiation Oncology | Admitting: Radiation Oncology

## 2023-11-16 ENCOUNTER — Other Ambulatory Visit: Payer: Self-pay

## 2023-11-16 DIAGNOSIS — Z510 Encounter for antineoplastic radiation therapy: Secondary | ICD-10-CM | POA: Diagnosis not present

## 2023-11-16 LAB — RAD ONC ARIA SESSION SUMMARY
Course Elapsed Days: 15
Plan Fractions Treated to Date: 11
Plan Prescribed Dose Per Fraction: 2.66 Gy
Plan Total Fractions Prescribed: 16
Plan Total Prescribed Dose: 42.56 Gy
Reference Point Dosage Given to Date: 29.26 Gy
Reference Point Session Dosage Given: 2.66 Gy
Session Number: 11

## 2023-11-17 ENCOUNTER — Ambulatory Visit
Admission: RE | Admit: 2023-11-17 | Discharge: 2023-11-17 | Disposition: A | Discharge status: Home / Self Care | Source: Ambulatory Visit | Attending: Radiation Oncology | Admitting: Radiation Oncology

## 2023-11-17 ENCOUNTER — Other Ambulatory Visit: Payer: Self-pay

## 2023-11-17 ENCOUNTER — Ambulatory Visit: Admitting: Radiation Oncology

## 2023-11-17 DIAGNOSIS — Z510 Encounter for antineoplastic radiation therapy: Secondary | ICD-10-CM | POA: Diagnosis not present

## 2023-11-17 LAB — RAD ONC ARIA SESSION SUMMARY
Course Elapsed Days: 16
Plan Fractions Treated to Date: 12
Plan Prescribed Dose Per Fraction: 2.66 Gy
Plan Total Fractions Prescribed: 16
Plan Total Prescribed Dose: 42.56 Gy
Reference Point Dosage Given to Date: 31.92 Gy
Reference Point Session Dosage Given: 2.66 Gy
Session Number: 12

## 2023-11-20 ENCOUNTER — Ambulatory Visit
Admission: RE | Admit: 2023-11-20 | Discharge: 2023-11-20 | Disposition: A | Discharge status: Home / Self Care | Source: Ambulatory Visit | Attending: Radiation Oncology | Admitting: Radiation Oncology

## 2023-11-20 ENCOUNTER — Other Ambulatory Visit: Payer: Self-pay

## 2023-11-20 DIAGNOSIS — Z510 Encounter for antineoplastic radiation therapy: Secondary | ICD-10-CM | POA: Diagnosis not present

## 2023-11-20 LAB — RAD ONC ARIA SESSION SUMMARY
Course Elapsed Days: 19
Plan Fractions Treated to Date: 13
Plan Prescribed Dose Per Fraction: 2.66 Gy
Plan Total Fractions Prescribed: 16
Plan Total Prescribed Dose: 42.56 Gy
Reference Point Dosage Given to Date: 34.58 Gy
Reference Point Session Dosage Given: 2.66 Gy
Session Number: 13

## 2023-11-21 ENCOUNTER — Other Ambulatory Visit: Payer: Self-pay

## 2023-11-21 ENCOUNTER — Ambulatory Visit
Admission: RE | Admit: 2023-11-21 | Discharge: 2023-11-21 | Disposition: A | Discharge status: Home / Self Care | Source: Ambulatory Visit | Attending: Radiation Oncology | Admitting: Radiation Oncology

## 2023-11-21 DIAGNOSIS — Z510 Encounter for antineoplastic radiation therapy: Secondary | ICD-10-CM | POA: Diagnosis not present

## 2023-11-21 LAB — RAD ONC ARIA SESSION SUMMARY
Course Elapsed Days: 20
Plan Fractions Treated to Date: 14
Plan Prescribed Dose Per Fraction: 2.66 Gy
Plan Total Fractions Prescribed: 16
Plan Total Prescribed Dose: 42.56 Gy
Reference Point Dosage Given to Date: 37.24 Gy
Reference Point Session Dosage Given: 2.66 Gy
Session Number: 14

## 2023-11-22 ENCOUNTER — Ambulatory Visit
Admission: RE | Admit: 2023-11-22 | Discharge: 2023-11-22 | Disposition: A | Discharge status: Home / Self Care | Source: Ambulatory Visit | Attending: Radiation Oncology | Admitting: Radiation Oncology

## 2023-11-22 ENCOUNTER — Other Ambulatory Visit: Payer: Self-pay

## 2023-11-22 DIAGNOSIS — Z510 Encounter for antineoplastic radiation therapy: Secondary | ICD-10-CM | POA: Diagnosis not present

## 2023-11-22 LAB — RAD ONC ARIA SESSION SUMMARY
Course Elapsed Days: 21
Plan Fractions Treated to Date: 15
Plan Prescribed Dose Per Fraction: 2.66 Gy
Plan Total Fractions Prescribed: 16
Plan Total Prescribed Dose: 42.56 Gy
Reference Point Dosage Given to Date: 39.9 Gy
Reference Point Session Dosage Given: 2.66 Gy
Session Number: 15

## 2023-11-23 ENCOUNTER — Ambulatory Visit

## 2023-11-24 ENCOUNTER — Ambulatory Visit

## 2023-11-24 ENCOUNTER — Ambulatory Visit
Admission: RE | Admit: 2023-11-24 | Discharge: 2023-11-24 | Disposition: A | Discharge status: Home / Self Care | Source: Ambulatory Visit | Attending: Radiation Oncology | Admitting: Radiation Oncology

## 2023-11-24 ENCOUNTER — Other Ambulatory Visit: Payer: Self-pay

## 2023-11-24 DIAGNOSIS — Z510 Encounter for antineoplastic radiation therapy: Secondary | ICD-10-CM | POA: Diagnosis not present

## 2023-11-24 LAB — RAD ONC ARIA SESSION SUMMARY
Course Elapsed Days: 23
Plan Fractions Treated to Date: 16
Plan Prescribed Dose Per Fraction: 2.66 Gy
Plan Total Fractions Prescribed: 16
Plan Total Prescribed Dose: 42.56 Gy
Reference Point Dosage Given to Date: 42.56 Gy
Reference Point Session Dosage Given: 2.66 Gy
Session Number: 16

## 2023-11-27 ENCOUNTER — Ambulatory Visit

## 2023-11-27 ENCOUNTER — Ambulatory Visit: Payer: Self-pay | Admitting: Registered Nurse

## 2023-11-27 ENCOUNTER — Other Ambulatory Visit: Payer: Self-pay

## 2023-11-27 DIAGNOSIS — Z510 Encounter for antineoplastic radiation therapy: Secondary | ICD-10-CM | POA: Diagnosis not present

## 2023-11-27 LAB — RAD ONC ARIA SESSION SUMMARY
Course Elapsed Days: 26
Plan Fractions Treated to Date: 1
Plan Prescribed Dose Per Fraction: 2 Gy
Plan Total Fractions Prescribed: 4
Plan Total Prescribed Dose: 8 Gy
Reference Point Dosage Given to Date: 2 Gy
Reference Point Session Dosage Given: 2 Gy
Session Number: 17

## 2023-11-28 ENCOUNTER — Ambulatory Visit

## 2023-11-29 ENCOUNTER — Ambulatory Visit
Admission: RE | Admit: 2023-11-29 | Discharge: 2023-11-29 | Disposition: A | Discharge status: Home / Self Care | Source: Ambulatory Visit | Attending: Radiation Oncology | Admitting: Radiation Oncology

## 2023-11-29 ENCOUNTER — Ambulatory Visit

## 2023-11-29 ENCOUNTER — Other Ambulatory Visit: Payer: Self-pay

## 2023-11-29 DIAGNOSIS — Z510 Encounter for antineoplastic radiation therapy: Secondary | ICD-10-CM | POA: Diagnosis not present

## 2023-11-29 LAB — RAD ONC ARIA SESSION SUMMARY
Course Elapsed Days: 28
Plan Fractions Treated to Date: 2
Plan Prescribed Dose Per Fraction: 2 Gy
Plan Total Fractions Prescribed: 4
Plan Total Prescribed Dose: 8 Gy
Reference Point Dosage Given to Date: 4 Gy
Reference Point Session Dosage Given: 2 Gy
Session Number: 18

## 2023-11-30 ENCOUNTER — Ambulatory Visit
Admission: RE | Admit: 2023-11-30 | Discharge: 2023-11-30 | Disposition: A | Discharge status: Home / Self Care | Source: Ambulatory Visit | Attending: Radiation Oncology | Admitting: Radiation Oncology

## 2023-11-30 ENCOUNTER — Other Ambulatory Visit: Payer: Self-pay

## 2023-11-30 DIAGNOSIS — Z510 Encounter for antineoplastic radiation therapy: Secondary | ICD-10-CM | POA: Diagnosis not present

## 2023-11-30 LAB — RAD ONC ARIA SESSION SUMMARY
Course Elapsed Days: 29
Plan Fractions Treated to Date: 3
Plan Prescribed Dose Per Fraction: 2 Gy
Plan Total Fractions Prescribed: 4
Plan Total Prescribed Dose: 8 Gy
Reference Point Dosage Given to Date: 6 Gy
Reference Point Session Dosage Given: 2 Gy
Session Number: 19

## 2023-12-01 ENCOUNTER — Other Ambulatory Visit: Payer: Self-pay

## 2023-12-01 ENCOUNTER — Ambulatory Visit
Admission: RE | Admit: 2023-12-01 | Discharge: 2023-12-01 | Disposition: A | Discharge status: Home / Self Care | Source: Ambulatory Visit | Attending: Radiation Oncology | Admitting: Radiation Oncology

## 2023-12-01 ENCOUNTER — Ambulatory Visit

## 2023-12-01 DIAGNOSIS — Z510 Encounter for antineoplastic radiation therapy: Secondary | ICD-10-CM | POA: Diagnosis not present

## 2023-12-01 LAB — RAD ONC ARIA SESSION SUMMARY
Course Elapsed Days: 30
Plan Fractions Treated to Date: 4
Plan Prescribed Dose Per Fraction: 2 Gy
Plan Total Fractions Prescribed: 4
Plan Total Prescribed Dose: 8 Gy
Reference Point Dosage Given to Date: 8 Gy
Reference Point Session Dosage Given: 2 Gy
Session Number: 20

## 2023-12-04 NOTE — Radiation Completion Notes (Addendum)
  Radiation Oncology         [phone]) [phone] ________________________________  Name: Catherine Atkins MRN: [id number]  Date of Service: 12/01/2023  DOB: 01-02-1951  End of Treatment Note   Diagnosis:  Stage IA, pT2N0M0, grade 2, ER/PR positive invasive ductal carcinoma of the right breast   Intent: Curative     ==========DELIVERED PLANS==========  First Treatment Date: 2023-11-01 Last Treatment Date: 2023-12-01   Plan Name: Breast_R Site: Breast, Right Technique: 3D Mode: Photon Dose Per Fraction: 2.66 Gy Prescribed Dose (Delivered / Prescribed): 42.56 Gy / 42.56 Gy Prescribed Fxs (Delivered / Prescribed): 16 / 16   Plan Name: Breast_R_Bst Site: Breast, Right Technique: 3D Mode: Photon Dose Per Fraction: 2 Gy Prescribed Dose (Delivered / Prescribed): 8 Gy / 8 Gy Prescribed Fxs (Delivered / Prescribed): 4 / 4     ==========ON TREATMENT VISIT DATES========== 2023-11-03, 2023-11-10, 2023-11-17, 2023-11-24   See weekly On Treatment Notes in Epic for details in the Media tab (listed as Progress notes on the On Treatment Visit Dates listed above). The patient tolerated radiation. She developed fatigue and anticipated skin changes in the treatment field.   The patient will receive a call in about one month from the radiation oncology department. She will continue follow up with Dr. Gudena as well.      Donald KYM Husband, PAC

## 2023-12-12 ENCOUNTER — Encounter: Payer: Self-pay | Admitting: *Deleted

## 2023-12-29 NOTE — Progress Notes (Signed)
  Radiation Oncology         [phone]) [phone] ________________________________  Name: Catherine Atkins MRN: [id number]  Date of Service: 01/01/2024  DOB: [birth year]  Post Treatment Telephone Note  Diagnosis: Stage IA, pT2N0M0, grade 2, ER/PR positive invasive ductal carcinoma of the right breast (as documented in provider EOT note)  The patient was available for call today.   Symptoms of fatigue have improved since completing therapy.  Symptoms of skin changes have improved since completing therapy.  The patient was encouraged to avoid sun exposure in the area of prior treatment for up to one year following radiation with either sunscreen or by the style of clothing worn in the sun.  The patient has scheduled follow up with her medical oncologist Dr. Gudena for ongoing surveillance, and was encouraged to call if she develops concerns or questions regarding radiation.   This concludes the interaction.  Rosaline Minerva, LPN

## 2024-01-01 ENCOUNTER — Ambulatory Visit
Admission: RE | Admit: 2024-01-01 | Discharge: 2024-01-01 | Disposition: A | Discharge status: Home / Self Care | Source: Ambulatory Visit | Attending: Radiation Oncology | Admitting: Radiation Oncology

## 2024-02-22 ENCOUNTER — Encounter: Payer: Self-pay | Admitting: Adult Health

## 2024-02-22 ENCOUNTER — Inpatient Hospital Stay: Attending: Hematology and Oncology | Admitting: Adult Health

## 2024-02-22 VITALS — BP 152/78 | HR 65 | Temp 98.1°F | Resp 18 | Wt 228.6 lb

## 2024-02-22 DIAGNOSIS — Z79811 Long term (current) use of aromatase inhibitors: Secondary | ICD-10-CM | POA: Insufficient documentation

## 2024-02-22 DIAGNOSIS — Z13820 Encounter for screening for osteoporosis: Secondary | ICD-10-CM

## 2024-02-22 DIAGNOSIS — C50211 Malignant neoplasm of upper-inner quadrant of right female breast: Secondary | ICD-10-CM | POA: Insufficient documentation

## 2024-02-22 DIAGNOSIS — Z803 Family history of malignant neoplasm of breast: Secondary | ICD-10-CM | POA: Diagnosis not present

## 2024-02-22 DIAGNOSIS — Z806 Family history of leukemia: Secondary | ICD-10-CM | POA: Diagnosis not present

## 2024-02-22 DIAGNOSIS — R748 Abnormal levels of other serum enzymes: Secondary | ICD-10-CM | POA: Insufficient documentation

## 2024-02-22 DIAGNOSIS — Z9012 Acquired absence of left breast and nipple: Secondary | ICD-10-CM | POA: Diagnosis not present

## 2024-02-22 DIAGNOSIS — Z1501 Genetic susceptibility to malignant neoplasm of breast: Secondary | ICD-10-CM

## 2024-02-22 DIAGNOSIS — Z170 Estrogen receptor positive status [ER+]: Secondary | ICD-10-CM | POA: Diagnosis not present

## 2024-02-22 DIAGNOSIS — C50512 Malignant neoplasm of lower-outer quadrant of left female breast: Secondary | ICD-10-CM | POA: Diagnosis not present

## 2024-02-22 DIAGNOSIS — Z1509 Genetic susceptibility to other malignant neoplasm: Secondary | ICD-10-CM | POA: Insufficient documentation

## 2024-02-22 MED ORDER — ANASTROZOLE 1 MG PO TABS
1.0000 mg | ORAL_TABLET | Freq: Every day | ORAL | 3 refills | Status: AC
Start: 2024-02-22 — End: ?

## 2024-02-22 NOTE — Progress Notes (Signed)
 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  Report date is 10/02/2023.   The CancerNext-Expanded gene panel offered by [facility] and includes sequencing, rearrangement, and RNA analysis for the following 76 genes: AIP, ALK, APC, ATM, AXIN2, BAP1, BARD1, BMPR1A, BRCA1, BRCA2, BRIP1, CDC73, CDH1, CDK4, CDKN1B, CDKN2A, CEBPA, CHEK2, CTNNA1, DDX41, DICER1, ETV6, FH, FLCN, GATA2, LZTR1, MAX, MBD4, MEN1, MET, MLH1, MSH2, MSH3, MSH6, MUTYH, NF1, NF2, NTHL1, PALB2, PHOX2B, PMS2, POT1, PRKAR1A, PTCH1, PTEN, RAD51C, RAD51D, RB1, RET, RUNX1, SDHA, SDHAF2, SDHB, SDHC, SDHD, SMAD4, SMARCA4, SMARCB1, SMARCE1, STK11, SUFU, TMEM127, TP53, TSC1, TSC2, VHL, and WT1 (sequencing and deletion/duplication); EGFR, HOXB13, KIT, MITF, PDGFRA, POLD1, and POLE (sequencing only); EPCAM and GREM1 (deletion/duplication only).       11/01/2023 - 12/01/2023 Radiation Therapy   Plan Name: Breast_R Site: Breast, Right Technique: 3D Mode: Photon Dose Per Fraction: 2.66 Gy Prescribed Dose (Delivered / Prescribed): 42.56 Gy / 42.56 Gy Prescribed Fxs (Delivered / Prescribed): 16 / 16   Plan Name: Breast_R_Bst Site: Breast, Right Technique: 3D Mode: Photon Dose Per Fraction: 2 Gy Prescribed Dose (Delivered / Prescribed): 8 Gy / 8 Gy Prescribed Fxs (Delivered / Prescribed): 4 / 4   Malignant neoplasm of lower-outer quadrant of left breast of female, estrogen receptor positive (HCC)  2013 Initial Diagnosis   Left mastectomy (did not receive chemo or radiation or  antiestrogens) Oncotype recurrence score: 27 (distant recurrence rate: 18%)   10/02/2023 Genetic Testing   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  Report date is 10/02/2023.   The CancerNext-Expanded gene panel offered by [facility] and includes sequencing, rearrangement, and RNA analysis for the following 76 genes: AIP, ALK, APC, ATM, AXIN2, BAP1, BARD1, BMPR1A, BRCA1, BRCA2, BRIP1, CDC73, CDH1, CDK4, CDKN1B, CDKN2A, CEBPA, CHEK2, CTNNA1, DDX41, DICER1, ETV6, FH, FLCN, GATA2, LZTR1, MAX, MBD4, MEN1, MET, MLH1, MSH2, MSH3, MSH6, MUTYH, NF1, NF2, NTHL1, PALB2, PHOX2B, PMS2, POT1, PRKAR1A, PTCH1, PTEN, RAD51C, RAD51D, RB1, RET, RUNX1, SDHA, SDHAF2, SDHB, SDHC, SDHD, SMAD4, SMARCA4, SMARCB1, SMARCE1, STK11, SUFU, TMEM127, TP53, TSC1, TSC2, VHL, and WT1 (sequencing and deletion/duplication); EGFR, HOXB13, KIT, MITF, PDGFRA, POLD1, and POLE (sequencing only); EPCAM and GREM1 (deletion/duplication only).       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  Report date is 10/02/2023.   The CancerNext-Expanded gene panel offered by [facility] and includes sequencing, rearrangement, and RNA analysis for the following 76 genes: AIP, ALK, APC, ATM, AXIN2, BAP1, BARD1, BMPR1A, BRCA1, BRCA2, BRIP1, CDC73, CDH1, CDK4, CDKN1B, CDKN2A, CEBPA, CHEK2, CTNNA1, DDX41, DICER1,  ETV6, FH, FLCN, GATA2, LZTR1, MAX, MBD4, MEN1, MET, MLH1, MSH2, MSH3, MSH6, MUTYH, NF1,  NF2, NTHL1, PALB2, PHOX2B, PMS2, POT1, PRKAR1A, PTCH1, PTEN, RAD51C, RAD51D, RB1, RET, RUNX1, SDHA, SDHAF2, SDHB, SDHC, SDHD, SMAD4, SMARCA4, SMARCB1, SMARCE1, STK11, SUFU, TMEM127, TP53, TSC1, TSC2, VHL, and WT1 (sequencing and deletion/duplication); EGFR, HOXB13, KIT, MITF, PDGFRA, POLD1, and POLE (sequencing only); EPCAM and GREM1 (deletion/duplication only).       10/24/2023 Cancer Staging   Staging form: Breast, AJCC 8th Edition - Pathologic stage from 10/24/2023: pT2, cN0, cM0, G2, ER+, PR+, HER2- - Signed by Lanell Donald Stagger, PA-C on 10/24/2023 Stage prefix: Initial diagnosis Multigene prognostic tests performed: None Histologic grading system: 3 grade system     INTERVAL HISTORY:  Discussed the use of AI scribe software for clinical note transcription with the patient, who gave verbal consent to proceed.  History of Present Illness Catherine Atkins is a 73 year old female with a history of right-sided breast cancer who presents for follow-up after radiation therapy.  She has estrogen and progesterone receptor positive, stage 1A right-sided breast cancer. She underwent a lumpectomy and radiation therapy and currently has no breast concerns or changes.  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 She has no family history of ovarian cancer and has had her ovaries removed.  She is scheduled to start anastrozole  due to her estrogen-positive breast cancer. She experiences some symptoms that may be associated with anastrozole , though unspecified.    REVIEW OF SYSTEMS:  Review of Systems  Constitutional:  Negative for appetite change, chills, fatigue, fever and unexpected weight change.  HENT:   Negative for hearing loss, lump/mass and trouble swallowing.   Eyes:  Negative for eye problems and icterus.  Respiratory:  Negative for chest tightness, cough and shortness of breath.   Cardiovascular:  Negative for chest pain, leg  swelling and palpitations.  Gastrointestinal:  Negative for abdominal distention, abdominal pain, constipation, diarrhea, nausea and vomiting.  Endocrine: Negative for hot flashes.  Genitourinary:  Negative for difficulty urinating.   Musculoskeletal:  Negative for arthralgias.  Skin:  Negative for itching and rash.  Neurological:  Negative for dizziness, extremity weakness, headaches and numbness.  Hematological:  Negative for adenopathy. Does not bruise/bleed easily.  Psychiatric/Behavioral:  Negative for depression. The patient is not nervous/anxious.   Breast: Denies any new nodularity, masses, tenderness, nipple changes, or nipple discharge.       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   Past Surgical History:  Procedure Laterality Date   ABDOMINAL HYSTERECTOMY  1989   complete   ABSCESS DRAINAGE     on neck   APPENDECTOMY     BREAST BIOPSY Right 08/31/2023   US  RT BREAST BX W LOC DEV 1ST LESION IMG BX SPEC US  GUIDE 08/31/2023 [facility]   BREAST BIOPSY  09/21/2023   MM RT RADIOACTIVE SEED LOC MAMMO GUIDE 09/21/2023 [facility]   BREAST LUMPECTOMY  WITH RADIOACTIVE SEED LOCALIZATION Right 09/26/2023   Procedure: BREAST LUMPECTOMY WITH RADIOACTIVE SEED LOCALIZATION;  Surgeon: Vanderbilt Ned, MD;  Location: [facility];  Service: General;  Laterality: Right;  right breast seed lumpectomy   COLONOSCOPY  2004   Dr. Shaaron: diverticulosis, diminutive rectosigmoid polyps removed/cold biopsy.   COLONOSCOPY WITH PROPOFOL   N/A 07/09/2018   Procedure: COLONOSCOPY WITH PROPOFOL ;  Surgeon: Shaaron Lamar HERO, MD;  [facility];  Service: Endoscopy;  Laterality: N/A;  2:00pm   DEBRIDEMENT AND CLOSURE WOUND Left 11/06/2015   Procedure: EXCISION OF LEFT CHEST CHRONIC SEROMA ;  Surgeon: Earlis Ranks, MD;  Location: [facility];  Service: Plastics;  Laterality: Left;   MASTECTOMY Left 01/25/2012   no radiation no chemo   POLYPECTOMY  07/09/2018   Procedure: POLYPECTOMY;  Surgeon: Shaaron Lamar HERO, MD;  Location: [facility];  Service: Endoscopy;;  cecal polyps x3      ALLERGIES:  Allergies  Allergen Reactions   Codeine Itching     CURRENT MEDICATIONS:  Outpatient Encounter Medications as of 02/22/2024  Medication Sig   acetaminophen  (TYLENOL ) 500 MG tablet Take 1,000 mg by mouth every 6 (six) hours as needed for moderate pain.   amLODipine-benazepril (LOTREL) 10-40 MG capsule Take 1 capsule by mouth daily.   anastrozole  (ARIMIDEX ) 1 MG tablet Take 1 tablet (1 mg total) by mouth daily.   atenolol  (TENORMIN ) 25 MG tablet Take 50 mg by mouth daily.    buPROPion  (WELLBUTRIN  XL) 150 MG 24 hr tablet Take 450 mg by mouth daily.    cholecalciferol  (VITAMIN D3) 25 MCG (1000 UNIT) tablet Take 1,000 Units by mouth daily.   cyanocobalamin (VITAMIN B12) 100 MCG tablet Take 100 mcg by mouth daily.   escitalopram (LEXAPRO) 5 MG tablet Take 5 mg by mouth daily.    hydrochlorothiazide (HYDRODIURIL) 25 MG tablet Take 25 mg by mouth daily.   Multiple Vitamin (MULTIVITAMIN WITH MINERALS) TABS tablet Take 1 tablet by mouth daily.   oxyCODONE  (OXY IR/ROXICODONE ) 5 MG immediate release tablet Take 1 tablet (5 mg total) by mouth every 6 (six) hours as needed for severe pain (pain score 7-10). (Patient not taking: Reported on 02/22/2024)   No facility-administered encounter medications on file as of 02/22/2024.     ONCOLOGIC FAMILY HISTORY:  Family History  Problem Relation Age of Onset   Breast cancer  Mother        dx late 30s   Leukemia Father        dx 47s   Breast cancer Sister        dx 57s   Heart attack Brother    Prostate cancer Brother    Prostate cancer Brother    Prostate cancer Maternal Uncle        x2 maternal uncles   Colon cancer Neg Hx      SOCIAL HISTORY:  Social History   Socioeconomic History   Marital status: Widowed    Spouse name: Not on file   Number of children: 0   Years of education: Not on file   Highest education level: Not on file  Occupational History    Employer: [employer]    Comment: supervisor   Tobacco Use   Smoking status: Never   Smokeless tobacco: Never  Vaping Use   Vaping status: Never Used  Substance and Sexual Activity   Alcohol use: No   Drug use: No   Sexual activity: Not Currently    Birth control/protection:  Surgical    Comment: Hyst  Other Topics Concern   Not on file  Social History Narrative   Not on file   Social Drivers of Health   Financial Resource Strain: Not on file  Food Insecurity: No Food Insecurity (10/20/2023)   Hunger Vital Sign    Worried About Running Out of Food in the Last Year: Never true    Ran Out of Food in the Last Year: Never true  Transportation Needs: No Transportation Needs (10/20/2023)   PRAPARE - Administrator, Civil Service (Medical): No    Lack of Transportation (Non-Medical): No  Physical Activity: Not on file  Stress: Not on file  Social Connections: Unknown (06/14/2022)   Received from [facility]   Social Network    Social Network: Not on file  Intimate Partner Violence: Not At Risk (10/20/2023)   Humiliation, Afraid, Rape, and Kick questionnaire    Fear of Current or Ex-Partner: No    Emotionally Abused: No    Physically Abused: No    Sexually Abused: No     OBSERVATIONS/OBJECTIVE:  BP (!) 152/78 (BP Location: Left Wrist, Patient Position: Sitting)   Pulse 65   Temp 98.1 F (36.7 C) (Oral)   Resp 18   Wt 228 lb 9.6 oz (103.7 kg)   SpO2 99%    BMI 33.27 kg/m  GENERAL: Patient is a well appearing female in no acute distress HEENT:  Sclerae anicteric.  Oropharynx clear and moist. No ulcerations or evidence of oropharyngeal candidiasis. Neck is supple.  NODES:  No cervical, supraclavicular, or axillary lymphadenopathy palpated.  BREAST EXAM:  left breast s/p mastectomy, no sign of local recurrence, right breast s/p lumpectomy and radiation, no sign of local recurrence LUNGS:  Clear to auscultation bilaterally.  No wheezes or rhonchi. HEART:  Regular rate and rhythm. No murmur appreciated. ABDOMEN:  Soft, nontender.  Positive, normoactive bowel sounds. No organomegaly palpated. MSK:  No focal spinal tenderness to palpation. Full range of motion bilaterally in the upper extremities. EXTREMITIES:  No peripheral edema.   SKIN:  Clear with no obvious rashes or skin changes. No nail dyscrasia. NEURO:  Nonfocal. Well oriented.  Appropriate affect.   LABORATORY DATA:  None for this visit.  DIAGNOSTIC IMAGING:  None for this visit.      ASSESSMENT AND PLAN:  Ms.. Cardamone is a pleasant 73 y.o. female with Stage IA right breast invasive ductal carcinoma, ER+/PR+/HER2-, diagnosed in 08/2023, treated with lumpectomy, adjuvant radiation therapy, and anti-estrogen therapy with Anastrozole  beginning in 02/2024.  She presents to the Survivorship Clinic for our initial meeting and routine follow-up post-completion of treatment for breast cancer.    1. Stage IA right breast cancer:  Ms. Laningham is continuing to recover from definitive treatment for breast cancer. She will follow-up with her medical oncologist, Dr.  Gudena in 10/2024 with history and physical exam per surveillance protocol.  She will start anti-estrogen therapy with anastrozole . We discussed risks and benefits and she consented to proceed with therapy. Her mammogram is due 08/2024; orders placed today.   Today, a comprehensive survivorship care plan and treatment summary was reviewed with  the patient today detailing her breast cancer diagnosis, treatment course, potential late/long-term effects of treatment, appropriate follow-up care with recommendations for the future, and patient education resources.  A copy of this summary, along with a letter will be sent to the patient's primary care provider via mail/fax/In Basket message after today's visit.    2. ATM mutation:  Patient reports she's been advised to avoid MRI and contrast enhanced mammogram due to her elevated creatinine.    3. Bone health:  Given Ms. Andonian's age/history of breast cancer and her current treatment regimen including anti-estrogen therapy with Anastrozole , she is at risk for bone demineralization.  Her last DEXA scan was 10 years ago.  Repeat ordered to occur in the next 3 months.  She was given education on specific activities to promote bone health.  4. Cancer screening:  Due to Ms. Kostelecky's history and her age, she should receive screening for skin cancers, colon cancer, and gynecologic cancers.  The information and recommendations are listed on the patient's comprehensive care plan/treatment summary and were reviewed in detail with the patient.    5. Health maintenance and wellness promotion: Ms. Mussell was encouraged to consume 5-7 servings of fruits and vegetables per day. We reviewed the Nutrition Rainbow handout.  She was also encouraged to engage in moderate to vigorous exercise for 30 minutes per day most days of the week.  She was instructed to limit her alcohol consumption and continue to abstain from tobacco use.     6. Support services/counseling: It is not uncommon for this period of the patient's cancer care trajectory to be one of many emotions and stressors.   She was given information regarding our available services and encouraged to contact me with any questions or for help enrolling in any of our support group/programs.    Follow up instructions:    -Return to cancer center for f/u with Dr.  Odean in 10/2024 -Return to see Dr. Vanderbilt in 04/2024 and 04/2025 -DEXA in 05/2024  -Mammogram due in 08/2024 -She is welcome to return back to the Survivorship Clinic at any time; no additional follow-up needed at this time.  -Consider referral back to survivorship as a long-term survivor for continued surveillance  The patient was provided an opportunity to ask questions and all were answered. The patient agreed with the plan and demonstrated an understanding of the instructions.   Total encounter time:45 minutes*in face-to-face visit time, chart review, lab review, care coordination, order entry, and documentation of the encounter time.    Morna Kendall, NP 02/22/24 1:47 PM Medical Oncology and Hematology [facility] [address] [place], [place] [address] Tel. [phone]    Fax. [phone]  *Total Encounter Time as defined by the Centers for Medicare and Medicaid Services includes, in addition to the face-to-face time of a patient visit (documented in the note above) non-face-to-face time: obtaining and reviewing outside history, ordering and reviewing medications, tests or procedures, care coordination (communications with other health care professionals or caregivers) and documentation in the medical record.

## 2024-03-20 ENCOUNTER — Ambulatory Visit: Payer: Self-pay | Admitting: General Practice

## 2024-03-20 DIAGNOSIS — Z23 Encounter for immunization: Secondary | ICD-10-CM

## 2024-05-06 ENCOUNTER — Ambulatory Visit (INDEPENDENT_AMBULATORY_CARE_PROVIDER_SITE_OTHER): Admitting: Otolaryngology

## 2024-05-06 ENCOUNTER — Ambulatory Visit (INDEPENDENT_AMBULATORY_CARE_PROVIDER_SITE_OTHER): Admitting: Audiology

## 2024-05-17 ENCOUNTER — Encounter: Payer: Self-pay | Admitting: Physician Assistant

## 2024-06-23 NOTE — Progress Notes (Unsigned)
 "     Catherine Console, PA-C [address] [place], [place]  [address] Phone: [phone]   Gastroenterology Consultation  Referring Provider:     Sheryle Carwin, MD Primary Care Physician:  Sheryle Carwin, MD Primary Gastroenterologist:  Catherine Console, PA-C / Glendia Holt, MD  Reason for Consultation:     Positive ATM gene mutation; history of colon polyps        HPI:   Discussed the use of AI scribe software for clinical note transcription with the patient, who gave verbal consent to proceed.  Catherine Atkins is a 74 year old female with an ATM gene variant, prior breast cancer, colonic polyps, and diverticulosis who presents for cancer screening and surveillance.  New patient.  Here to evaluate positive ATM gene mutation. 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 2-3% lifetime risk for epithelial ovarian cancer 5-10% lifetime risk for pancreatic cancer   Diagnosed with left breast cancer in 2013.  Diagnosed with right breast cancer 08/2023 and has been undergoing treatment for the past year s/p lumpectomy and radiation.  Currently cancer free, stage Ia.  Followed by oncologist Dr. Gudena.  07/2018 last colonoscopy by Dr. Shaaron at [facility]: 3 (4 mm to 7 mm) tubular adenoma polyps removed.  Diverticulosis.  Adequate prep.  History of Present Illness Genetic Susceptibility to Malignancy: - ATM gene variant identified following breast cancer diagnosis - Previously evaluated by genetics counselor regarding cancer risk - No family history of colon, pancreas, esophagus, or stomach cancer  Colorectal Polyps and Colonic Diverticulosis: - Last colonoscopy in February 2020 with removal of three small precancerous polyps - Colonic diverticulosis identified during previous colonoscopy - No symptoms attributable to diverticulosis  Gastrointestinal Symptoms: -  Intermittent diarrhea - Occasional heartburn - Belching and flatulence - No abdominal pain, blood in stool, unusual weight loss, or dysphagia - Weight gain present  Chronic Kidney Disease: - Decreased kidney function with creatinine previously elevated to 1.83 in 2023 - Most recent creatinine improved to 1.32 as of December 2025 - Avoids NSAIDs due to impact on kidney function - Kidney function monitored regularly     Past Medical History:  Diagnosis Date   Anxiety    ATM gene mutation positive 10/04/2023   ATM gene mutation positive 10/04/2023   Breast cancer (HCC) 01/25/2012   left mastectomy   Breast cancer (HCC) 09/2023   right breast IDC   Dental crowns present    Depression    History of breast cancer 2013   Hypertension    states is under control with meds., has been on med. 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    Past Surgical History:  Procedure Laterality Date   ABDOMINAL HYSTERECTOMY  1989   complete   ABSCESS DRAINAGE     on neck   APPENDECTOMY     BREAST BIOPSY Right 08/31/2023   US  RT BREAST BX W LOC DEV 1ST LESION IMG BX SPEC US  GUIDE 08/31/2023 [facility]   BREAST BIOPSY  09/21/2023   MM RT RADIOACTIVE SEED LOC MAMMO GUIDE 09/21/2023 [facility]   BREAST LUMPECTOMY WITH RADIOACTIVE SEED LOCALIZATION Right 09/26/2023   Procedure: BREAST LUMPECTOMY WITH RADIOACTIVE SEED LOCALIZATION;  Surgeon: Vanderbilt Ned, MD;  Location: [facility];  Service: General;  Laterality: Right;  right breast seed lumpectomy  COLONOSCOPY  2004   Dr. Shaaron: diverticulosis, diminutive rectosigmoid polyps removed/cold biopsy.   COLONOSCOPY WITH PROPOFOL  N/A 07/09/2018   Procedure: COLONOSCOPY WITH PROPOFOL ;  Surgeon: Shaaron Lamar HERO, MD;  [facility];  Service: Endoscopy;  Laterality: N/A;  2:00pm   DEBRIDEMENT AND CLOSURE WOUND Left 11/06/2015   Procedure: EXCISION OF LEFT CHEST CHRONIC SEROMA ;   Surgeon: Earlis Ranks, MD;  Location: [facility];  Service: Plastics;  Laterality: Left;   MASTECTOMY Left 01/25/2012   no radiation no chemo   POLYPECTOMY  07/09/2018   Procedure: POLYPECTOMY;  Surgeon: Shaaron Lamar HERO, MD;  Location: [facility];  Service: Endoscopy;;  cecal polyps x3     Prior to Admission medications  Medication Sig Start Date End Date Taking? Authorizing Provider  acetaminophen  (TYLENOL ) 500 MG tablet Take 1,000 mg by mouth every 6 (six) hours as needed for moderate pain.    [provider]  amLODipine-benazepril (LOTREL) 10-40 MG capsule Take 1 capsule by mouth daily. 06/18/21   [provider]  anastrozole  (ARIMIDEX ) 1 MG tablet Take 1 tablet (1 mg total) by mouth daily. 02/22/24   Crawford Morna Pickle, NP  atenolol  (TENORMIN ) 25 MG tablet Take 50 mg by mouth daily.     [provider]  buPROPion  (WELLBUTRIN  XL) 150 MG 24 hr tablet Take 450 mg by mouth daily.     [provider]  cholecalciferol  (VITAMIN D3) 25 MCG (1000 UNIT) tablet Take 1,000 Units by mouth daily.    [provider]  cyanocobalamin (VITAMIN B12) 100 MCG tablet Take 100 mcg by mouth daily.    [provider]  escitalopram (LEXAPRO) 5 MG tablet Take 5 mg by mouth daily.  03/01/18   [provider]  hydrochlorothiazide (HYDRODIURIL) 25 MG tablet Take 25 mg by mouth daily. 06/21/21   [provider]  Multiple Vitamin (MULTIVITAMIN WITH MINERALS) TABS tablet Take 1 tablet by mouth daily.    [provider]  oxyCODONE  (OXY IR/ROXICODONE ) 5 MG immediate release tablet Take 1 tablet (5 mg total) by mouth every 6 (six) hours as needed for severe pain (pain score 7-10). Patient not taking: Reported on 02/22/2024 09/26/23   Vanderbilt Ned, MD    Family History  Problem Relation Age of Onset   Breast cancer Mother        dx late 76s   Leukemia Father        dx 59s   Breast cancer Sister        dx 64s    Heart attack Brother    Prostate cancer Brother    Prostate cancer Brother    Prostate cancer Maternal Uncle        x2 maternal uncles   Colon cancer Neg Hx      Social History[1]  Allergies as of 06/25/2024 - Review Complete 06/25/2024  Allergen Reaction Noted   Codeine Itching 12/16/2011    Review of Systems:    All systems reviewed and negative except where noted in HPI.   Physical Exam:  BP 122/72   Pulse 65   Ht 5' 9.5 (1.765 m)   Wt 235 lb (106.6 kg)   BMI 34.21 kg/m  No LMP recorded. Patient has had a hysterectomy.  General:   Alert,  Well-developed, well-nourished, obese, pleasant and cooperative in NAD Lungs:  Respirations even and unlabored.  Clear throughout to auscultation.   No wheezes, crackles, or rhonchi. No acute distress. Heart:  Regular rate and rhythm;  no murmurs, clicks, rubs, or gallops. Abdomen:  Normal bowel sounds.  No bruits.  Soft, and non-distended without masses, hepatosplenomegaly or hernias noted.  No Tenderness.  No guarding or rebound tenderness.    Neurologic:  Alert and oriented x3;  grossly normal neurologically. Psych:  Alert and cooperative. Normal mood and affect.   Imaging Studies: No results found.  Labs: CBC    Component Value Date/Time   WBC 7.4 09/06/2023 1156   WBC 7.7 07/04/2018 0902   RBC 3.69 (L) 09/06/2023 1156   HGB 12.0 09/06/2023 1156   HGB 11.9 05/09/2022 0830   HGB 11.5 (L) 02/04/2016 1002   HCT 35.3 (L) 09/06/2023 1156   HCT 35.2 05/09/2022 0830   HCT 35.1 02/04/2016 1002   PLT 277 09/06/2023 1156   PLT 290 05/09/2022 0830   MCV 95.7 09/06/2023 1156   MCV 92 05/09/2022 0830   MCV 95.1 02/04/2016 1002    CMP     Component Value Date/Time   NA 142 09/06/2023 1156   NA 143 05/11/2023 0918   NA 140 02/04/2016 1002   K 3.8 09/06/2023 1156   K 4.5 02/04/2016 1002   CL 108 09/06/2023 1156   CL 107 11/27/2012 0830   CO2 27 09/06/2023 1156   CO2 24 02/04/2016 1002   GLUCOSE 100 (H) 09/06/2023 1156    GLUCOSE 109 02/04/2016 1002   GLUCOSE 124 (H) 11/27/2012 0830   BUN 18 09/06/2023 1156   BUN 25 05/11/2023 0918   BUN 24.2 02/04/2016 1002   CREATININE 1.32 (H) 09/06/2023 1156   CREATININE 1.5 (H) 02/04/2016 1002   CALCIUM 9.7 09/06/2023 1156   CALCIUM 9.2 02/04/2016 1002   PROT 7.7 09/06/2023 1156   PROT 7.4 05/11/2023 0918   PROT 7.0 02/04/2016 1002   ALBUMIN 4.5 09/06/2023 1156   ALBUMIN 4.6 05/11/2023 0918   ALBUMIN 3.3 (L) 02/04/2016 1002   AST 20 09/06/2023 1156   AST 31 02/04/2016 1002   ALT 17 09/06/2023 1156   ALT 20 02/04/2016 1002   ALKPHOS 55 09/06/2023 1156   ALKPHOS 46 02/04/2016 1002   BILITOT 0.4 09/06/2023 1156   BILITOT 0.31 02/04/2016 1002   GFRNONAA 42 (L) 09/06/2023 1156   GFRAA 44 (L) 04/13/2020 0947   GFRAA 47 (L) 08/30/2017 1059    Assessment and Plan:   Catherine Atkins is a 74 y.o. y/o female has been referred for:  1.  Positive ATM gene mutation. Pancreatic Cancer Screening/Risk Reduction: Avoid smoking, heavy alcohol use, and obesity. Consider pancreatic cancer screening.  - I am ordering Abdominal MRI / MRCP with contrast for pancreas cancer screening.  2.  History of Breast Cancer (Dx 2013 and 08/2023) - Order Abdominal MRI / MRCP; Rule out metastatic disease  2.  History of adenomatous colon polyps - Scheduling surveillance colonoscopy I discussed risks of colonoscopy with patient to include risk of bleeding, colon perforation, and risk of sedation.  Patient expressed understanding and agrees to proceed with colonoscopy.   Discussion: Current established screening recommendations for ATM carriers focus on breast cancer surveillance (annual mammogram starting at age 8, consider MRI at age 68-35), pancreatic cancer screening (starting at age 58), and prostate cancer screening (PSA starting at age 78).  Pancreatic cancer screening for ATM pathogenic variant carriers should begin at age 55 years (or 10 years younger than the earliest  pancreatic cancer diagnosis in the family, whichever is earlier) using annual contrast-enhanced MRI/MRCP and/or endoscopic ultrasound (EUS).   Follow up as  needed based on test results and GI symptoms.  Catherine Console, PA-C       [1]  Social History Tobacco Use   Smoking status: Never   Smokeless tobacco: Never  Vaping Use   Vaping status: Never Used  Substance Use Topics   Alcohol use: No   Drug use: No   "

## 2024-06-25 ENCOUNTER — Ambulatory Visit: Admitting: Physician Assistant

## 2024-06-25 ENCOUNTER — Encounter: Payer: Self-pay | Admitting: Physician Assistant

## 2024-06-25 VITALS — BP 122/72 | HR 65 | Ht 69.5 in | Wt 235.0 lb

## 2024-06-25 DIAGNOSIS — Z853 Personal history of malignant neoplasm of breast: Secondary | ICD-10-CM | POA: Diagnosis not present

## 2024-06-25 DIAGNOSIS — Z1509 Genetic susceptibility to other malignant neoplasm: Secondary | ICD-10-CM | POA: Diagnosis not present

## 2024-06-25 DIAGNOSIS — N183 Chronic kidney disease, stage 3 unspecified: Secondary | ICD-10-CM

## 2024-06-25 DIAGNOSIS — Z01818 Encounter for other preprocedural examination: Secondary | ICD-10-CM

## 2024-06-25 DIAGNOSIS — Z860100 Personal history of colon polyps, unspecified: Secondary | ICD-10-CM

## 2024-06-25 DIAGNOSIS — Z860101 Personal history of adenomatous and serrated colon polyps: Secondary | ICD-10-CM

## 2024-06-25 DIAGNOSIS — Z1501 Genetic susceptibility to malignant neoplasm of breast: Secondary | ICD-10-CM

## 2024-06-25 MED ORDER — NA SULFATE-K SULFATE-MG SULF 17.5-3.13-1.6 GM/177ML PO SOLN: 1.0000 | Freq: Once | ORAL | 0 refills | Status: AC

## 2024-06-25 NOTE — Patient Instructions (Signed)
 You have been scheduled for an MRCP at [facility] on Monday, 07/01/24. Your appointment time is 7:00 am. Please arrive to admitting (at main entrance of the hospital) 30 minutes prior to your appointment time for registration purposes. Please make certain not to have anything to eat or drink 6 hours prior to your test. In addition, if you have any metal in your body, have a pacemaker or defibrillator, please be sure to let your ordering physician know. This test typically takes 45 minutes to 1 hour to complete. Should you need to reschedule, please call [phone] to do so.   You have been scheduled for a colonoscopy. Please follow written instructions given to you at your visit today.   If you use inhalers (even only as needed), please bring them with you on the day of your procedure.  DO NOT TAKE 7 DAYS PRIOR TO TEST- Trulicity (dulaglutide) Ozempic, Wegovy (semaglutide) Mounjaro, Zepbound (tirzepatide) Bydureon Bcise (exanatide extended release)  DO NOT TAKE 1 DAY PRIOR TO YOUR TEST Rybelsus (semaglutide) Adlyxin (lixisenatide) Victoza (liraglutide) Byetta (exanatide) ___________________________________________________________________________   Thank you for trusting me with your gastrointestinal care!   Ellouise Console, PA-C  _______________________________________________________  If your blood pressure at your visit was 140/90 or greater, please contact your primary care physician to follow up on this.  _______________________________________________________  If you are age 89 or older, your body mass index should be between 23-30. Your Body mass index is 34.21 kg/m. If this is out of the aforementioned range listed, please consider follow up with your Primary Care Provider.  If you are age 85 or younger, your body mass index should be between 19-25. Your Body mass index is 34.21 kg/m. If this is out of the aformentioned range listed, please consider follow up with  your Primary Care Provider.   ________________________________________________________  The [facility] providers would like to encourage you to use MYCHART to communicate with providers for non-urgent requests or questions.  Due to long hold times on the telephone, sending your provider a message by [facility] may be a faster and more efficient way to get a response.  Please allow 48 business hours for a response.  Please remember that this is for non-urgent requests.  _______________________________________________________  [facility] is using a team-based approach to care.  Your team is made up of your doctor and two to three APPS. Our APPS (Nurse Practitioners and Physician Assistants) work with your physician to ensure care continuity for you. They are fully qualified to address your health concerns and develop a treatment plan. They communicate directly with your gastroenterologist to care for you. Seeing the Advanced Practice Practitioners on your physician's team can help you by facilitating care more promptly, often allowing for earlier appointments, access to diagnostic testing, procedures, and other specialty referrals.

## 2024-07-01 ENCOUNTER — Ambulatory Visit (HOSPITAL_COMMUNITY)

## 2024-07-01 NOTE — Progress Notes (Signed)
 Agree with the assessment and plan as outlined by Ellouise Console, PA-C.  Agree with MRCP for pancreatic cancer screening, although guidelines are mixed on benefit of pancreatic screening in ATM patients without family history of pancreatic cancer.  If normal, would recommend further discussion about benefits of ongoing screening

## 2024-07-12 ENCOUNTER — Encounter: Payer: Self-pay | Admitting: Gastroenterology

## 2024-07-12 ENCOUNTER — Other Ambulatory Visit: Payer: Self-pay | Admitting: Physician Assistant

## 2024-07-12 ENCOUNTER — Ambulatory Visit (HOSPITAL_COMMUNITY): Admission: RE | Admit: 2024-07-12

## 2024-07-12 DIAGNOSIS — Z860100 Personal history of colon polyps, unspecified: Secondary | ICD-10-CM

## 2024-07-12 DIAGNOSIS — N183 Chronic kidney disease, stage 3 unspecified: Secondary | ICD-10-CM

## 2024-07-12 DIAGNOSIS — Z1509 Genetic susceptibility to other malignant neoplasm: Secondary | ICD-10-CM

## 2024-07-12 MED ORDER — GADOBUTROL 1 MMOL/ML IV SOLN
10.0000 mL | Freq: Once | INTRAVENOUS | Status: AC | PRN
  Administered 2024-07-12: 10 mL via INTRAVENOUS

## 2024-07-19 ENCOUNTER — Encounter: Admitting: Gastroenterology

## 2024-10-22 ENCOUNTER — Inpatient Hospital Stay: Admitting: Hematology and Oncology
